# Patient Record
Sex: Male | Born: 1950 | Race: White | Hispanic: No | Smoking: Never smoker
Health system: Southern US, Community
[De-identification: ages and names within clinical notes are randomized; demographics above are authoritative.]

## PROBLEM LIST (undated history)

## (undated) DIAGNOSIS — K219 Gastro-esophageal reflux disease without esophagitis: Secondary | ICD-10-CM

## (undated) DIAGNOSIS — E119 Type 2 diabetes mellitus without complications: Secondary | ICD-10-CM

## (undated) DIAGNOSIS — T8859XA Other complications of anesthesia, initial encounter: Secondary | ICD-10-CM

## (undated) DIAGNOSIS — E785 Hyperlipidemia, unspecified: Secondary | ICD-10-CM

## (undated) DIAGNOSIS — H269 Unspecified cataract: Secondary | ICD-10-CM

## (undated) DIAGNOSIS — K2270 Barrett's esophagus without dysplasia: Secondary | ICD-10-CM

## (undated) DIAGNOSIS — M1990 Unspecified osteoarthritis, unspecified site: Secondary | ICD-10-CM

## (undated) DIAGNOSIS — Z87442 Personal history of urinary calculi: Secondary | ICD-10-CM

## (undated) DIAGNOSIS — T4145XA Adverse effect of unspecified anesthetic, initial encounter: Secondary | ICD-10-CM

## (undated) DIAGNOSIS — R011 Cardiac murmur, unspecified: Secondary | ICD-10-CM

## (undated) DIAGNOSIS — J189 Pneumonia, unspecified organism: Secondary | ICD-10-CM

## (undated) DIAGNOSIS — N200 Calculus of kidney: Secondary | ICD-10-CM

## (undated) DIAGNOSIS — T7840XA Allergy, unspecified, initial encounter: Secondary | ICD-10-CM

## (undated) DIAGNOSIS — I10 Essential (primary) hypertension: Secondary | ICD-10-CM

## (undated) HISTORY — DX: Type 2 diabetes mellitus without complications: E11.9

## (undated) HISTORY — PX: UPPER GASTROINTESTINAL ENDOSCOPY: SHX188

## (undated) HISTORY — DX: Gastro-esophageal reflux disease without esophagitis: K21.9

## (undated) HISTORY — DX: Unspecified cataract: H26.9

## (undated) HISTORY — PX: KIDNEY STONE SURGERY: SHX686

## (undated) HISTORY — PX: TONSILLECTOMY: SUR1361

## (undated) HISTORY — DX: Calculus of kidney: N20.0

## (undated) HISTORY — DX: Barrett's esophagus without dysplasia: K22.70

## (undated) HISTORY — PX: CATARACT EXTRACTION, BILATERAL: SHX1313

## (undated) HISTORY — DX: Allergy, unspecified, initial encounter: T78.40XA

## (undated) HISTORY — DX: Hyperlipidemia, unspecified: E78.5

## (undated) HISTORY — PX: COLONOSCOPY: SHX174

## (undated) HISTORY — PX: EYE SURGERY: SHX253

## (undated) HISTORY — DX: Essential (primary) hypertension: I10

---

## 1998-01-05 ENCOUNTER — Encounter: Payer: Self-pay | Admitting: Internal Medicine

## 1998-01-05 ENCOUNTER — Inpatient Hospital Stay (HOSPITAL_COMMUNITY): Admission: EM | Admit: 1998-01-05 | Discharge: 1998-01-06 | Payer: Self-pay | Admitting: Internal Medicine

## 1998-01-05 HISTORY — PX: TRANSTHORACIC ECHOCARDIOGRAM: SHX275

## 1998-07-12 ENCOUNTER — Emergency Department (HOSPITAL_COMMUNITY): Admission: EM | Admit: 1998-07-12 | Discharge: 1998-07-12 | Payer: Self-pay | Admitting: Emergency Medicine

## 1998-07-12 ENCOUNTER — Encounter: Payer: Self-pay | Admitting: Emergency Medicine

## 1998-07-13 ENCOUNTER — Encounter: Payer: Self-pay | Admitting: Emergency Medicine

## 2000-03-17 ENCOUNTER — Other Ambulatory Visit: Admission: RE | Admit: 2000-03-17 | Discharge: 2000-03-17 | Payer: Self-pay | Admitting: Gastroenterology

## 2004-02-14 ENCOUNTER — Ambulatory Visit: Payer: Self-pay | Admitting: Internal Medicine

## 2004-02-23 ENCOUNTER — Emergency Department (HOSPITAL_COMMUNITY): Admission: EM | Admit: 2004-02-23 | Discharge: 2004-02-23 | Payer: Self-pay | Admitting: Emergency Medicine

## 2004-02-24 ENCOUNTER — Ambulatory Visit (HOSPITAL_COMMUNITY): Admission: RE | Admit: 2004-02-24 | Discharge: 2004-02-24 | Payer: Self-pay | Admitting: Urology

## 2004-02-24 ENCOUNTER — Encounter: Payer: Self-pay | Admitting: Urology

## 2004-03-07 ENCOUNTER — Ambulatory Visit: Payer: Self-pay | Admitting: Internal Medicine

## 2004-03-13 ENCOUNTER — Ambulatory Visit: Payer: Self-pay | Admitting: Internal Medicine

## 2005-04-04 ENCOUNTER — Ambulatory Visit: Payer: Self-pay | Admitting: Internal Medicine

## 2005-04-11 ENCOUNTER — Ambulatory Visit: Payer: Self-pay | Admitting: Internal Medicine

## 2006-01-14 ENCOUNTER — Ambulatory Visit: Payer: Self-pay | Admitting: Internal Medicine

## 2006-02-18 ENCOUNTER — Ambulatory Visit: Payer: Self-pay | Admitting: Gastroenterology

## 2006-03-20 ENCOUNTER — Encounter (INDEPENDENT_AMBULATORY_CARE_PROVIDER_SITE_OTHER): Payer: Self-pay | Admitting: Specialist

## 2006-03-20 ENCOUNTER — Ambulatory Visit: Payer: Self-pay | Admitting: Gastroenterology

## 2006-03-20 HISTORY — PX: ESOPHAGOGASTRODUODENOSCOPY: SHX1529

## 2006-04-08 ENCOUNTER — Ambulatory Visit: Payer: Self-pay | Admitting: Internal Medicine

## 2006-04-08 LAB — CONVERTED CEMR LAB
ALT: 55 units/L — ABNORMAL HIGH (ref 0–40)
AST: 34 units/L (ref 0–37)
Albumin: 3.8 g/dL (ref 3.5–5.2)
Basophils Absolute: 0 10*3/uL (ref 0.0–0.1)
Calcium: 8.9 mg/dL (ref 8.4–10.5)
Chloride: 104 meq/L (ref 96–112)
Creatinine, Ser: 1.1 mg/dL (ref 0.4–1.5)
Eosinophils Absolute: 0.1 10*3/uL (ref 0.0–0.6)
Eosinophils Relative: 2.2 % (ref 0.0–5.0)
GFR calc non Af Amer: 74 mL/min
Glucose, Bld: 88 mg/dL (ref 70–99)
HCT: 44.2 % (ref 39.0–52.0)
Ketones, ur: NEGATIVE mg/dL
MCV: 87.4 fL (ref 78.0–100.0)
Neutrophils Relative %: 56.3 % (ref 43.0–77.0)
Nitrite: NEGATIVE
Platelets: 232 10*3/uL (ref 150–400)
RBC: 5.07 M/uL (ref 4.22–5.81)
RDW: 13.1 % (ref 11.5–14.6)
Specific Gravity, Urine: 1.02 (ref 1.000–1.03)
TSH: 1.49 microintl units/mL (ref 0.35–5.50)
Total Bilirubin: 1 mg/dL (ref 0.3–1.2)
Total CHOL/HDL Ratio: 6.7
Total Protein, Urine: NEGATIVE mg/dL
Triglycerides: 186 mg/dL — ABNORMAL HIGH (ref 0–149)
Urine Glucose: NEGATIVE mg/dL
Urobilinogen, UA: 0.2 (ref 0.0–1.0)
VLDL: 37 mg/dL (ref 0–40)
WBC: 4.3 10*3/uL — ABNORMAL LOW (ref 4.5–10.5)
pH: 5.5 (ref 5.0–8.0)

## 2006-04-13 ENCOUNTER — Ambulatory Visit: Payer: Self-pay | Admitting: Internal Medicine

## 2006-06-15 ENCOUNTER — Ambulatory Visit: Payer: Self-pay | Admitting: Internal Medicine

## 2007-01-19 ENCOUNTER — Ambulatory Visit: Payer: Self-pay | Admitting: Internal Medicine

## 2007-01-19 DIAGNOSIS — J45909 Unspecified asthma, uncomplicated: Secondary | ICD-10-CM | POA: Insufficient documentation

## 2007-01-19 DIAGNOSIS — K219 Gastro-esophageal reflux disease without esophagitis: Secondary | ICD-10-CM | POA: Insufficient documentation

## 2007-01-19 DIAGNOSIS — J309 Allergic rhinitis, unspecified: Secondary | ICD-10-CM | POA: Insufficient documentation

## 2007-01-19 DIAGNOSIS — I10 Essential (primary) hypertension: Secondary | ICD-10-CM

## 2007-01-19 DIAGNOSIS — E785 Hyperlipidemia, unspecified: Secondary | ICD-10-CM

## 2007-01-29 ENCOUNTER — Telehealth: Payer: Self-pay | Admitting: Internal Medicine

## 2007-01-29 ENCOUNTER — Ambulatory Visit: Payer: Self-pay | Admitting: Internal Medicine

## 2007-01-29 DIAGNOSIS — Z87442 Personal history of urinary calculi: Secondary | ICD-10-CM

## 2007-01-29 DIAGNOSIS — K2270 Barrett's esophagus without dysplasia: Secondary | ICD-10-CM

## 2007-03-04 ENCOUNTER — Encounter: Payer: Self-pay | Admitting: Internal Medicine

## 2007-10-20 ENCOUNTER — Ambulatory Visit: Payer: Self-pay | Admitting: Internal Medicine

## 2007-10-20 LAB — CONVERTED CEMR LAB
AST: 37 units/L (ref 0–37)
Basophils Absolute: 0 10*3/uL (ref 0.0–0.1)
Basophils Relative: 0.6 % (ref 0.0–3.0)
Bilirubin Urine: NEGATIVE
Bilirubin, Direct: 0.1 mg/dL (ref 0.0–0.3)
Chloride: 109 meq/L (ref 96–112)
Cholesterol: 230 mg/dL (ref 0–200)
Creatinine, Ser: 1.3 mg/dL (ref 0.4–1.5)
Direct LDL: 143.4 mg/dL
Eosinophils Absolute: 0.1 10*3/uL (ref 0.0–0.7)
GFR calc non Af Amer: 61 mL/min
HDL: 30.8 mg/dL — ABNORMAL LOW (ref >39.0)
Hemoglobin, Urine: NEGATIVE
Ketones, ur: NEGATIVE mg/dL
Leukocytes, UA: NEGATIVE
MCHC: 34.9 g/dL (ref 30.0–36.0)
MCV: 86.9 fL (ref 78.0–100.0)
Neutrophils Relative %: 62.2 % (ref 43.0–77.0)
Platelets: 211 10*3/uL (ref 150–400)
Potassium: 4.2 meq/L (ref 3.5–5.1)
RDW: 13 % (ref 11.5–14.6)
Sodium: 142 meq/L (ref 135–145)
Total Bilirubin: 1 mg/dL (ref 0.3–1.2)
Total CHOL/HDL Ratio: 7.5
Urine Glucose: NEGATIVE mg/dL
Urobilinogen, UA: 0.2 (ref 0.0–1.0)
VLDL: 57 mg/dL — ABNORMAL HIGH (ref 0–40)

## 2007-10-27 ENCOUNTER — Ambulatory Visit: Payer: Self-pay | Admitting: Internal Medicine

## 2008-03-03 ENCOUNTER — Encounter: Payer: Self-pay | Admitting: Internal Medicine

## 2008-07-11 ENCOUNTER — Ambulatory Visit: Payer: Self-pay | Admitting: Internal Medicine

## 2008-07-11 LAB — CONVERTED CEMR LAB
Cholesterol: 176 mg/dL (ref 0–200)
LDL Cholesterol: 117 mg/dL — ABNORMAL HIGH (ref 0–99)
Triglycerides: 105 mg/dL (ref 0.0–149.0)

## 2008-07-14 ENCOUNTER — Ambulatory Visit: Payer: Self-pay | Admitting: Internal Medicine

## 2008-07-14 DIAGNOSIS — K5289 Other specified noninfective gastroenteritis and colitis: Secondary | ICD-10-CM | POA: Insufficient documentation

## 2008-11-14 ENCOUNTER — Ambulatory Visit: Payer: Self-pay | Admitting: Internal Medicine

## 2008-11-15 LAB — CONVERTED CEMR LAB
ALT: 84 units/L — ABNORMAL HIGH (ref 0–53)
AST: 56 units/L — ABNORMAL HIGH (ref 0–37)
BUN: 16 mg/dL (ref 6–23)
Bilirubin, Direct: 0.1 mg/dL (ref 0.0–0.3)
Cholesterol: 186 mg/dL (ref 0–200)
Creatinine, Ser: 1.1 mg/dL (ref 0.4–1.5)
Eosinophils Relative: 1.9 % (ref 0.0–5.0)
GFR calc non Af Amer: 73.64 mL/min (ref >60)
Ketones, ur: NEGATIVE mg/dL
LDL Cholesterol: 113 mg/dL — ABNORMAL HIGH (ref 0–99)
Leukocytes, UA: NEGATIVE
Monocytes Relative: 10.3 % (ref 3.0–12.0)
Neutrophils Relative %: 62.7 % (ref 43.0–77.0)
PSA: 0.95 ng/mL (ref 0.10–4.00)
Platelets: 209 10*3/uL (ref 150.0–400.0)
Specific Gravity, Urine: 1.025 (ref 1.000–1.030)
Total Bilirubin: 1 mg/dL (ref 0.3–1.2)
Triglycerides: 181 mg/dL — ABNORMAL HIGH (ref 0.0–149.0)
Urine Glucose: NEGATIVE mg/dL
VLDL: 36.2 mg/dL (ref 0.0–40.0)
WBC: 5.9 10*3/uL (ref 4.5–10.5)
pH: 5.5 (ref 5.0–8.0)

## 2008-11-22 ENCOUNTER — Ambulatory Visit: Payer: Self-pay | Admitting: Internal Medicine

## 2009-01-02 ENCOUNTER — Ambulatory Visit: Payer: Self-pay | Admitting: Gastroenterology

## 2009-01-02 ENCOUNTER — Encounter (INDEPENDENT_AMBULATORY_CARE_PROVIDER_SITE_OTHER): Payer: Self-pay | Admitting: *Deleted

## 2009-01-15 ENCOUNTER — Ambulatory Visit: Payer: Self-pay | Admitting: Gastroenterology

## 2009-01-15 LAB — HM COLONOSCOPY

## 2009-02-07 ENCOUNTER — Encounter (INDEPENDENT_AMBULATORY_CARE_PROVIDER_SITE_OTHER): Payer: Self-pay | Admitting: *Deleted

## 2009-03-20 ENCOUNTER — Ambulatory Visit: Payer: Self-pay | Admitting: Internal Medicine

## 2009-03-20 DIAGNOSIS — J209 Acute bronchitis, unspecified: Secondary | ICD-10-CM | POA: Insufficient documentation

## 2009-05-16 ENCOUNTER — Ambulatory Visit: Payer: Self-pay | Admitting: Internal Medicine

## 2009-05-16 LAB — CONVERTED CEMR LAB
Albumin: 4.1 g/dL (ref 3.5–5.2)
Alkaline Phosphatase: 82 units/L (ref 39–117)
BUN: 19 mg/dL (ref 6–23)
Chloride: 107 meq/L (ref 96–112)
Cholesterol: 230 mg/dL — ABNORMAL HIGH (ref 0–200)
Direct LDL: 174.2 mg/dL
HDL: 38.2 mg/dL — ABNORMAL LOW (ref >39.00)
Hgb A1c MFr Bld: 5.9 % (ref 4.6–6.5)
Potassium: 4.4 meq/L (ref 3.5–5.1)
Sodium: 141 meq/L (ref 135–145)
Testosterone: 425.97 ng/dL (ref 350.00–890.00)
Total CHOL/HDL Ratio: 6
VLDL: 36.6 mg/dL (ref 0.0–40.0)

## 2009-05-23 ENCOUNTER — Ambulatory Visit: Payer: Self-pay | Admitting: Internal Medicine

## 2009-08-06 ENCOUNTER — Telehealth: Payer: Self-pay | Admitting: Internal Medicine

## 2009-10-10 ENCOUNTER — Telehealth: Payer: Self-pay | Admitting: Gastroenterology

## 2009-12-28 ENCOUNTER — Ambulatory Visit: Payer: Self-pay | Admitting: Internal Medicine

## 2009-12-29 LAB — CONVERTED CEMR LAB
AST: 42 units/L — ABNORMAL HIGH (ref 0–37)
Albumin: 4.1 g/dL (ref 3.5–5.2)
Alkaline Phosphatase: 86 units/L (ref 39–117)
Basophils Relative: 0.5 % (ref 0.0–3.0)
Bilirubin Urine: NEGATIVE
Bilirubin, Direct: 0.1 mg/dL (ref 0.0–0.3)
CO2: 28 meq/L (ref 19–32)
Calcium: 9.1 mg/dL (ref 8.4–10.5)
Creatinine, Ser: 1.1 mg/dL (ref 0.4–1.5)
GFR calc non Af Amer: 74.12 mL/min (ref >60)
HDL: 43.3 mg/dL (ref >39.00)
Hemoglobin, Urine: NEGATIVE
Hemoglobin: 15.1 g/dL (ref 13.0–17.0)
Leukocytes, UA: NEGATIVE
Lymphocytes Relative: 23.3 % (ref 12.0–46.0)
Monocytes Relative: 9.4 % (ref 3.0–12.0)
Neutro Abs: 3.2 10*3/uL (ref 1.4–7.7)
Neutrophils Relative %: 64.3 % (ref 43.0–77.0)
Nitrite: NEGATIVE
PSA: 0.71 ng/mL (ref 0.10–4.00)
RBC: 5.02 M/uL (ref 4.22–5.81)
Sodium: 139 meq/L (ref 135–145)
Total CHOL/HDL Ratio: 5
Total Protein, Urine: NEGATIVE mg/dL
Total Protein: 7.4 g/dL (ref 6.0–8.3)
Triglycerides: 138 mg/dL (ref 0.0–149.0)
WBC: 4.9 10*3/uL (ref 4.5–10.5)
pH: 5.5 (ref 5.0–8.0)

## 2010-01-04 ENCOUNTER — Encounter: Payer: Self-pay | Admitting: Internal Medicine

## 2010-01-04 ENCOUNTER — Ambulatory Visit: Payer: Self-pay | Admitting: Internal Medicine

## 2010-01-04 DIAGNOSIS — Z862 Personal history of diseases of the blood and blood-forming organs and certain disorders involving the immune mechanism: Secondary | ICD-10-CM

## 2010-01-04 DIAGNOSIS — Z8639 Personal history of other endocrine, nutritional and metabolic disease: Secondary | ICD-10-CM

## 2010-03-26 NOTE — Assessment & Plan Note (Signed)
Summary: COUGH--FEVER---STC   Vital Signs:  Patient profile:   60 year old male Weight:      239 pounds Temp:     98.5 degrees F oral Pulse rate:   83 / minute BP sitting:   122 / 84  (left arm)  Vitals Entered By: Tora Perches (March 20, 2009 4:13 PM) CC: cough-  fever Is Patient Diabetic? No   CC:  cough-  fever.  History of Present Illness: C/o cough x 2 d - bad   Preventive Screening-Counseling & Management  Alcohol-Tobacco     Smoking Status: current  Current Medications (verified): 1)  Azor 5-40 Mg  Tabs (Amlodipine-Olmesartan) .Marland Kitchen.. 1 Qd 2)  Protonix 40 Mg  Tbec (Pantoprazole Sodium) .... Once Daily 3)  Zyrtec Allergy 10 Mg  Tabs (Cetirizine Hcl) .Marland Kitchen.. 1 Once Daily Prn 4)  Vitamin D3 1000 Unit  Tabs (Cholecalciferol) .Marland Kitchen.. 1 Qd 5)  Pramosone 1-2.5 % Oint (Pramoxine-Hc) .... Use Bid 6)  Hydrocodone-Acetaminophen 5-325 Mg Tabs (Hydrocodone-Acetaminophen) .Marland Kitchen.. 1 By Mouth Up To 4 Times Per Day As Needed For Pain  Allergies: 1)  ! Penicillin 2)  ! Cipro 3)  ! * Nexium 4)  ! Biaxin (Clarithromycin) 5)  ! Pravachol 6)  ! * Albuterol 7)  ! * Flovent 8)  ! Vasotec 9)  Crestor (Rosuvastatin Calcium)  Past History:  Past Medical History: Last updated: 01/29/2007 Allergic rhinitis Asthma GERD/ Barrett's Hyperlipidemia Hypertension Nephrolithiasis, hx of Barrett's esophagus  Social History: Last updated: 11/22/2008 Single Never Smoked Regular exercise-yes  Social History: Smoking Status:  current  Review of Systems       The patient complains of chest pain and prolonged cough.  The patient denies fever, dyspnea on exertion, and abdominal pain.    Physical Exam  General:  Well-developed,well-nourished,in no acute distress; alert,appropriate and cooperative throughout examination Mouth:  Erythematous throat mucosa and intranasal erythema.  Lungs:  Normal respiratory effort, chest expands symmetrically. Lungs are clear to auscultation, no crackles or  wheezes. Heart:  Normal rate and regular rhythm. S1 and S2 normal without gallop, murmur, click, rub or other extra sounds. Abdomen:  Bowel sounds positive, abdomen soft and some tender without masses, organomegaly or hernias noted.   Impression & Recommendations:  Problem # 1:  BRONCHITIS, ACUTE (ICD-466.0) Assessment New  His updated medication list for this problem includes:    Levaquin 500 Mg Tabs (Levofloxacin) .Marland Kitchen... 1 by mouth qd    Tessalon Perles 100 Mg Caps (Benzonatate) .Marland Kitchen... 1-2 by mouth two times a day as needed cogh    Promethazine-codeine 6.25-10 Mg/67ml Syrp (Promethazine-codeine) .Marland Kitchen... 5-10 ml by mouth q id as needed cough  Complete Medication List: 1)  Azor 5-40 Mg Tabs (Amlodipine-olmesartan) .Marland Kitchen.. 1 qd 2)  Protonix 40 Mg Tbec (Pantoprazole sodium) .... Once daily 3)  Zyrtec Allergy 10 Mg Tabs (Cetirizine hcl) .Marland Kitchen.. 1 once daily prn 4)  Vitamin D3 1000 Unit Tabs (Cholecalciferol) .Marland Kitchen.. 1 qd 5)  Pramosone 1-2.5 % Oint (Pramoxine-hc) .... Use bid 6)  Hydrocodone-acetaminophen 5-325 Mg Tabs (Hydrocodone-acetaminophen) .Marland Kitchen.. 1 by mouth up to 4 times per day as needed for pain 7)  Levaquin 500 Mg Tabs (Levofloxacin) .Marland Kitchen.. 1 by mouth qd 8)  Tessalon Perles 100 Mg Caps (Benzonatate) .Marland Kitchen.. 1-2 by mouth two times a day as needed cogh 9)  Promethazine-codeine 6.25-10 Mg/72ml Syrp (Promethazine-codeine) .... 5-10 ml by mouth q id as needed cough  Patient Instructions: 1)  add testost to next labs pleasre 780.79 2)  Call if  you are not better in a reasonable amount of time or if worse.  Prescriptions: PROMETHAZINE-CODEINE 6.25-10 MG/5ML SYRP (PROMETHAZINE-CODEINE) 5-10 ml by mouth q id as needed cough  #300 ml x 0   Entered and Authorized by:   Tresa Garter MD   Signed by:   Tresa Garter MD on 03/20/2009   Method used:   Print then Give to Patient   RxID:   [id number] TESSALON PERLES 100 MG CAPS (BENZONATATE) 1-2 by mouth two times a day as needed cogh  #120 x 1    Entered and Authorized by:   Tresa Garter MD   Signed by:   Tresa Garter MD on 03/20/2009   Method used:   Print then Give to Patient   RxID:   [id number] LEVAQUIN 500 MG TABS (LEVOFLOXACIN) 1 by mouth qd  #10 x 0   Entered and Authorized by:   Tresa Garter MD   Signed by:   Tresa Garter MD on 03/20/2009   Method used:   Print then Give to Patient   RxID:   [id number]

## 2010-03-26 NOTE — Assessment & Plan Note (Signed)
Summary: FU  #  STC   Vital Signs:  Patient profile:   60 year old male Weight:      239 pounds BMI:     31.65 Temp:     98.5 degrees F oral Pulse rate:   103 / minute BP sitting:   134 / 80  (left arm)  Vitals Entered By: Tora Perches (May 23, 2009 2:33 PM) CC: F/U Is Patient Diabetic? No   CC:  F/U.  History of Present Illness: The patient presents for a follow up of hypertension, asthma, hyperlipidemia   Preventive Screening-Counseling & Management  Alcohol-Tobacco     Smoking Status: current  Current Medications (verified): 1)  Azor 5-40 Mg  Tabs (Amlodipine-Olmesartan) .Marland Kitchen.. 1 Qd 2)  Protonix 40 Mg  Tbec (Pantoprazole Sodium) .... Once Daily 3)  Zyrtec Allergy 10 Mg  Tabs (Cetirizine Hcl) .Marland Kitchen.. 1 Once Daily Prn 4)  Vitamin D3 1000 Unit  Tabs (Cholecalciferol) .Marland Kitchen.. 1 Qd 5)  Pramosone 1-2.5 % Oint (Pramoxine-Hc) .... Use Bid 6)  Hydrocodone-Acetaminophen 5-325 Mg Tabs (Hydrocodone-Acetaminophen) .Marland Kitchen.. 1 By Mouth Up To 4 Times Per Day As Needed For Pain 7)  Levaquin 500 Mg Tabs (Levofloxacin) .Marland Kitchen.. 1 By Mouth Qd 8)  Tessalon Perles 100 Mg Caps (Benzonatate) .Marland Kitchen.. 1-2 By Mouth Two Times A Day As Needed Cogh 9)  Promethazine-Codeine 6.25-10 Mg/42ml Syrp (Promethazine-Codeine) .... 5-10 Ml By Mouth Q Id As Needed Cough  Allergies: 1)  ! Penicillin 2)  ! Cipro 3)  ! * Nexium 4)  ! Biaxin (Clarithromycin) 5)  ! Pravachol 6)  ! * Albuterol 7)  ! * Flovent 8)  ! Vasotec 9)  Crestor (Rosuvastatin Calcium) 10)  Niacin  Physical Exam  General:  Well-developed,well-nourished,in no acute distress; alert,appropriate and cooperative throughout examination Nose:  External nasal examination shows no deformity or inflammation. Nasal mucosa are pink and moist without lesions or exudates. Mouth:  Erythematous throat mucosa and intranasal erythema.  Neck:  No deformities, masses, or tenderness noted. Lungs:  Normal respiratory effort, chest expands symmetrically. Lungs are  clear to auscultation, no crackles or wheezes. Heart:  Normal rate and regular rhythm. S1 and S2 normal without gallop, murmur, click, rub or other extra sounds. Abdomen:  Bowel sounds positive, abdomen soft and some tender without masses, organomegaly or hernias noted. Msk:  No deformity or scoliosis noted of thoracic or lumbar spine.   Extremities:  no edema Neurologic:  No cranial nerve deficits noted. Station and gait are normal. Plantar reflexes are down-going bilaterally. DTRs are symmetrical throughout. Sensory, motor and coordinative functions appear intact. Skin:  Intact without suspicious lesions or rashes Psych:  Cognition and judgment appear intact. Alert and cooperative with normal attention span and concentration. No apparent delusions, illusions, hallucinations   Impression & Recommendations:  Problem # 1:  HYPERTENSION (ICD-401.9)  His updated medication list for this problem includes:    Azor 5-40 Mg Tabs (Amlodipine-olmesartan) .Marland Kitchen... 1 qd  Problem # 2:  BARRETTS ESOPHAGUS (ICD-530.85)  Problem # 3:  ASTHMA (ICD-493.90) Assessment: Improved  Problem # 4:  ALLERGIC RHINITIS (ICD-477.9) Assessment: Comment Only  His updated medication list for this problem includes:    Zyrtec Allergy 10 Mg Tabs (Cetirizine hcl) .Marland Kitchen... 1 once daily prn  Problem # 5:  HYPERLIPIDEMIA (ICD-272.4) Assessment: Deteriorated  His updated medication list for this problem includes:    Welchol 3.75 Gm Pack (Colesevelam hcl) .Marland Kitchen... 1 by mouth qd  Labs Reviewed: SGOT: 40 (05/16/2009)   SGPT: 71 (05/16/2009)  HDL:38.20 (05/16/2009), 36.60 (11/14/2008)  LDL:113 (11/14/2008), 117 (07/11/2008)  Chol:230 (05/16/2009), 186 (11/14/2008)  Trig:183.0 (05/16/2009), 181.0 (11/14/2008)  Complete Medication List: 1)  Azor 5-40 Mg Tabs (Amlodipine-olmesartan) .Marland Kitchen.. 1 qd 2)  Protonix 40 Mg Tbec (Pantoprazole sodium) .... Once daily 3)  Zyrtec Allergy 10 Mg Tabs (Cetirizine hcl) .Marland Kitchen.. 1 once daily prn 4)   Vitamin D3 1000 Unit Tabs (Cholecalciferol) .Marland Kitchen.. 1 qd 5)  Pramosone 1-2.5 % Oint (Pramoxine-hc) .... Use bid 6)  Hydrocodone-acetaminophen 5-325 Mg Tabs (Hydrocodone-acetaminophen) .Marland Kitchen.. 1 by mouth up to 4 times per day as needed for pain 7)  Levaquin 500 Mg Tabs (Levofloxacin) .Marland Kitchen.. 1 by mouth qd 8)  Tessalon Perles 100 Mg Caps (Benzonatate) .Marland Kitchen.. 1-2 by mouth two times a day as needed cogh 9)  Promethazine-codeine 6.25-10 Mg/33ml Syrp (Promethazine-codeine) .... 5-10 ml by mouth q id as needed cough 10)  Welchol 3.75 Gm Pack (Colesevelam hcl) .Marland Kitchen.. 1 by mouth qd  Patient Instructions: 1)  Read about Welchol 2)  Please schedule a follow-up appointment in 6 months well w/labs.  Prescriptions: WELCHOL 3.75 GM PACK (COLESEVELAM HCL) 1 by mouth qd  #30 x 12   Entered and Authorized by:   Tresa Garter MD   Signed by:   Tresa Garter MD on 05/23/2009   Method used:   Print then Give to Patient   RxID:   [id number] PRAMOSONE 1-2.5 % OINT (PRAMOXINE-HC) use bid  #45 g x 4   Entered and Authorized by:   Tresa Garter MD   Signed by:   Tresa Garter MD on 05/23/2009   Method used:   Print then Give to Patient   RxID:   [id number]

## 2010-03-26 NOTE — Progress Notes (Signed)
Summary: Schedule Endoscopy   Phone Note Outgoing Call Call back at [facility] Phone [phone]   Call placed by: Harlow Mares CMA Duncan Dull),  October 10, 2009 9:39 AM Call placed to: Patient Summary of Call: Left message on patients machine to call back, patient is due for a EGD Initial call taken by: Harlow Mares CMA Duncan Dull),  October 10, 2009 9:40 AM  Follow-up for Phone Call        Left a message on the patient machine to call back and schedule a previsit and procedure with our office. A letter will be mailed to the patient.   Follow-up by: Harlow Mares CMA (AAMA),  October 15, 2009 10:15 AM

## 2010-03-26 NOTE — Assessment & Plan Note (Signed)
Summary: CPX/NWS  #   Vital Signs:  Patient profile:   60 year old male Height:      73 inches Weight:      241 pounds BMI:     31.91 Temp:     98.5 degrees F oral Pulse rate:   88 / minute Pulse rhythm:   regular Resp:     16 per minute BP sitting:   130 / 78  (left arm) Cuff size:   large  Vitals Entered By: Lanier Prude, Beverly Gust) (January 04, 2010 8:32 AM) CC: CPX Is Patient Diabetic? No   CC:  CPX.  History of Present Illness: The patient presents for a preventive health examination   Preventive Screening-Counseling & Management  Caffeine-Diet-Exercise     Does Patient Exercise: yes  Current Medications (verified): 1)  Azor 5-40 Mg  Tabs (Amlodipine-Olmesartan) .Marland Kitchen.. 1 Qd 2)  Protonix 40 Mg  Tbec (Pantoprazole Sodium) .... Once Daily 3)  Zyrtec Allergy 10 Mg  Tabs (Cetirizine Hcl) .Marland Kitchen.. 1 Once Daily Prn 4)  Vitamin D3 1000 Unit  Tabs (Cholecalciferol) .Marland Kitchen.. 1 Qd 5)  Pramosone 1-2.5 % Oint (Pramoxine-Hc) .... Use Bid 6)  Hydrocodone-Acetaminophen 5-325 Mg Tabs (Hydrocodone-Acetaminophen) .Marland Kitchen.. 1 By Mouth Up To 4 Times Per Day As Needed For Pain 7)  Welchol 3.75 Gm Pack (Colesevelam Hcl) .Marland Kitchen.. 1 By Mouth Qd  Allergies (verified): 1)  ! Penicillin 2)  ! Cipro 3)  ! * Nexium 4)  ! Biaxin (Clarithromycin) 5)  ! Pravachol 6)  ! * Albuterol 7)  ! * Flovent 8)  ! Vasotec 9)  Crestor (Rosuvastatin Calcium) 10)  Niacin 11)  Welchol  Past History:  Past Medical History: Last updated: 01/29/2007 Allergic rhinitis Asthma GERD/ Barrett's Hyperlipidemia Hypertension Nephrolithiasis, hx of Barrett's esophagus  Past Surgical History: Last updated: 07/29/2007 Tonsillectomy EGD ( 03/20/2006) Transthoracic Echocardiogram (01/05/1998)  Family History: Last updated: 01/29/2007 Family History Hypertension mother with colon polyps  Social History: Last updated: 01/04/2010 Single Never Smoked Regular exercise-yes Occupation: Tree surgeon for [employer] Alcohol use-no  Social History: Single Never Smoked Regular exercise-yes 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    Physical Exam  General:  Well-developed,well-nourished,in no acute distress; alert,appropriate and cooperative throughout examination Head:  Normocephalic and atraumatic without obvious abnormalities. No apparent alopecia or balding. Eyes:  No corneal or conjunctival inflammation noted. EOMI. Perrla. Funduscopic exam benign, without hemorrhages, exudates or papilledema. Vision grossly normal. Ears:  External ear exam shows no significant lesions or deformities.  Otoscopic examination reveals clear canals, tympanic membranes are intact bilaterally without bulging, retraction, inflammation or discharge. Hearing is grossly normal bilaterally. Nose:  External nasal examination shows no deformity or inflammation. Nasal mucosa are pink and moist without lesions or exudates. Mouth:  Erythematous throat mucosa and intranasal erythema.  Neck:  No deformities, masses, or tenderness noted. Lungs:  Normal respiratory effort, chest expands symmetrically. Lungs are clear to auscultation, no crackles or wheezes. Heart:  Normal rate and regular rhythm. S1 and S2 normal without gallop, murmur, click, rub or other extra sounds. Abdomen:  Bowel sounds positive, abdomen soft and some  tender without masses, organomegaly or hernias noted. Rectal:   No external abnormalities noted. Normal sphincter tone. No rectal masses or tenderness. G(-) Genitalia:  Testes bilaterally descended without nodularity, tenderness or masses. No scrotal masses or lesions. No penis lesions or urethral discharge. Prostate:  Prostate gland firm and smooth, no enlargement, nodularity, tenderness, mass, asymmetry or induration. Msk:  No deformity or scoliosis noted of thoracic or lumbar spine.   Pulses:  R and L carotid,radial,femoral,dorsalis pedis and posterior tibial pulses are full and equal bilaterally Extremities:  No clubbing, cyanosis, edema, or deformity noted with normal full range of motion of all joints.   Neurologic:  No cranial nerve deficits noted. Station and gait are normal. Plantar reflexes are down-going bilaterally. DTRs are symmetrical throughout. Sensory, motor and coordinative functions appear intact. Skin:  Intact without suspicious lesions or rashes Cervical Nodes:  No lymphadenopathy noted Inguinal Nodes:  No significant adenopathy Psych:  Cognition and judgment appear intact. Alert and cooperative with normal attention span and concentration. No apparent delusions, illusions, hallucinations   Impression & Recommendations:  Problem # 1:  WELL ADULT EXAM (ICD-V70.0) Assessment New Health and age related issues were discussed. Available screening tests and vaccinations were discussed as well. Healthy life style including good diet and exercise was discussed.  The labs were reviewed with the patient.  Orders: EKG w/ Interpretation (93000) no change from before  Problem # 2:  BARRETTS ESOPHAGUS (ICD-530.85) Assessment: Unchanged On the regimen of medicine(s) reflected in the chart    Problem # 3:  HYPERTENSION (ICD-401.9) Assessment: Improved  His updated medication list for this problem includes:    Azor 5-40 Mg Tabs (Amlodipine-olmesartan) .Marland Kitchen... 1 qd  Problem # 4:  ASTHMA (ICD-493.90) Assessment: Improved  Problem # 5:  LIVER  FUNCTION TESTS, ABNORMAL, HX OF (ICD-V12.2) Assessment: Unchanged  Complete Medication List: 1)  Azor 5-40 Mg Tabs (Amlodipine-olmesartan) .Marland Kitchen.. 1 qd 2)  Protonix 40 Mg Tbec (Pantoprazole sodium) .... Once daily 3)  Zyrtec Allergy 10 Mg Tabs (Cetirizine hcl) .Marland Kitchen.. 1 once daily prn 4)  Vitamin D3 1000 Unit Tabs (Cholecalciferol) .Marland Kitchen.. 1 qd 5)  Pramosone 1-2.5 % Oint (Pramoxine-hc) .... Use bid  Patient Instructions: 1)  Please schedule a follow-up appointment in 6 months. 2)  BMP prior to visit, ICD-9: 3)  Hepatic Panel prior to visit, ICD-9:272.0 4)  Lipid Panel prior to visit, ICD-9: Prescriptions: PRAMOSONE 1-2.5 % OINT (PRAMOXINE-HC) use bid  #45 g x 4   Entered and Authorized by:   Tresa Garter MD   Signed by:   Tresa Garter MD on 01/04/2010   Method used:   Print then Give to Patient   RxID:   [id number]    Orders Added: 1)  EKG w/ Interpretation [93000] 2)  Est. Patient age 84-64 [99396]   Immunization History:  Influenza Immunization History:    Influenza:  historical (11/19/2009)  Tetanus Vaccine (to be given today)   Immunization History:  Influenza Immunization History:    Influenza:  Historical (11/19/2009)  Tetanus Vaccine (to be given today)   Appended Document: CPX/NWS  #     Clinical Lists Changes  Orders: Added new Service order of Tdap => 64yrs IM (14782) - Signed Added new Service order of Admin 1st Vaccine (95621) - Signed Observations: Added new observation of TD BOOST VIS: 01/12/08 version given January 04, 2010. (01/04/2010 9:13) Added new observation of TD BOOSTERLO: [id number] (01/04/2010 9:13) Added new observation of TD BOOST EXP: 12/14/2011 (01/04/2010 9:13) Added new  observation of TD BOOSTERBY: Lanier Prude, [facility]) (01/04/2010 9:13) Added new observation of TD BOOSTERRT: IM (01/04/2010 9:13) Added new observation of TDBOOSTERDSE: 0.5 ml (01/04/2010 9:13) Added new observation of TD BOOSTERMF:  GlaxoSmithK[employer] (01/04/2010 9:13) Added new observation of TD BOOST SIT: left deltoid (01/04/2010 9:13) Added new observation of TD BOOSTER: Tdap (01/04/2010 9:13)       Immunizations Administered:  Tetanus Vaccine:    Vaccine Type: Tdap    Site: left deltoid    Mfr: GlaxoSmithKline    Dose: 0.5 ml    Route: IM    Given by: Lanier Prude, CMA(AAMA)    Exp. Date: 12/14/2011    Lot #: [id number]    VIS given: 01/12/08 version given January 04, 2010.

## 2010-03-26 NOTE — Progress Notes (Signed)
Summary: [facility]  Phone Note Call from Patient Call back at [phone]   Summary of Call: Patient left message on triage that his meds now have to go to [facility] due to insurance. Patient needs Azor and Protonix. Patient aware we sent them on his behalf. Initial call taken by: Lucious Groves,  August 06, 2009 1:47 PM    Prescriptions: PROTONIX 40 MG  TBEC (PANTOPRAZOLE SODIUM) once daily  #90 x 3   Entered by:   Lucious Groves   Authorized by:   Tresa Garter MD   Signed by:   Lucious Groves on 08/06/2009   Method used:   Faxed to ...       [facility]             , [place]         Ph:        Fax: [phone]   RxID:   [id number] AZOR 5-40 MG  TABS (AMLODIPINE-OLMESARTAN) 1 qd  #90 x 3   Entered by:   Lucious Groves   Authorized by:   Tresa Garter MD   Signed by:   Lucious Groves on 08/06/2009   Method used:   Faxed to ...       [facility] (mail-order)             , [place]         Ph:        Fax: [phone]   RxID:   [id number]

## 2010-06-28 ENCOUNTER — Other Ambulatory Visit: Payer: Self-pay

## 2010-06-28 ENCOUNTER — Other Ambulatory Visit: Payer: Self-pay | Admitting: Internal Medicine

## 2010-06-28 DIAGNOSIS — E7800 Pure hypercholesterolemia, unspecified: Secondary | ICD-10-CM

## 2010-07-05 ENCOUNTER — Ambulatory Visit: Payer: Self-pay | Admitting: Internal Medicine

## 2010-07-12 ENCOUNTER — Other Ambulatory Visit: Payer: Self-pay

## 2010-07-12 NOTE — Assessment & Plan Note (Signed)
[facility]                           PRIMARY CARE OFFICE NOTE   BRACY, PEPPER                     MRN:          [id number]  DATE:04/13/2006                            DOB:          1950-07-08    The patient is a 60 year old male who presents for wellness examination.  Past medical history, family history, social history, see April 11, 2006 note.  Had endoscopy in 2007.   ALLERGIES:  Reviewed with the patient.   MEDICINES:  Reviewed with the patient.   REVIEW OF SYSTEMS:  Note chest pain or shortness of breath, no syncope,  no indigestion.  He has been sick with a cold lately, coughing quite  bad.  Does have some food allergies that may turn quite bad.  The rest  of the 18-point review of systems is negative.   PHYSICAL EXAMINATION:  Blood pressure 129/77, pulse 111, temp 98.6,  weight 239 pounds (was 249).  Looks well, no acute distress.  HEENT:  Moist mucosa, erythema of the throat.  NECK:  Supple, no thyromegaly or bruit.  LUNGS:  Clear, no wheezes or rales.  HEART:  S1, S2, no murmur, no gallop.  ABDOMEN:  Soft, nontender, no organomegaly, no masses.  EXTREMITIES:  Lower extremity without edema.  NEURO: He is alert and appropriate and denies any distress.  SKIN:  Clear.   LABORATORY DATA:  On April 08, 2006, CBC normal, CMET normal,  cholesterol 232, LDL 173, TSH 1.49, PSA 0.82, h. Pylori negative,  urinalysis normal.   EKG today with sinus tachycardia, heart rate 100.   ASSESSMENT/PLAN:  1. Normal wellness examination.  Age/health related issues discussed.      Healthy lifestyle discussed.  He has slight tachycardia, likely due      to cold-like illness.  Advised to schedule colonoscopy, repeat exam      in 12 months.  2. Hypertension, well controlled now on Azor without side effects,      will continue.  3. Barrett's esophagitis, will continue with Protonix 40 mg daily, a      regular esoph. cancer surveillance.  4. Asthma, well controlled, cont. Rx.  5. Food allergies with anaphylactic-like reaction at times.  EpiPen.      He will make Ziploc bags with Sudafed to take 60 or 90 mg at once,      Benadryl 50 mg at once, prednisone 60 mg  day one, 20 mg  day two,      10 mg  day three.  EpiPen p.r.n.  6. Upper respiratory infection, given prescription for Phenergan for      codeine syrup 5 cc q.4 h.      p.r.n.  7. Call if problems, I will see him back in 6 months.     Georgina Quint. Plotnikov, MD  Electronically Signed    AVP/MedQ  DD: 04/16/2006  DT: 04/16/2006  Job #: [id number]

## 2010-07-12 NOTE — Op Note (Signed)
NAME:  Corey [name], Corey [name]              ACCOUNT NO.:  [id number]   MEDICAL RECORD NO.:  [id number]          PATIENT TYPE:  AMB   LOCATION:  DAY                          FACILITY:  [facility]   PHYSICIAN:  Mark C. Vernie Ammons, M.D.  DATE OF BIRTH:  [birth year]   DATE OF PROCEDURE:  02/24/2004  DATE OF DISCHARGE:                                 OPERATIVE REPORT   PREOPERATIVE DIAGNOSIS:  Left distal ureteral calculus.   POSTOPERATIVE DIAGNOSIS:  Left distal ureteral calculus.   PROCEDURES:  1.  Cystoscopy.  2.  Left retrograde pyelogram with interpretation.  3.  Left ureteroscopy with stone extraction and double-J stent placement.   SURGEON:  Mark C. Vernie Ammons, M.D.   ANESTHESIA:  General.   BLOOD LOSS:  Less than 1 mL.   DRAINS:  A 6 French 26 cm double-J stent with string in the left ureter.   SPECIMENS:  Stone to patient.   COMPLICATIONS:  None.   INDICATIONS:  The patient is a 60 year old white male who has a history of  kidney stones.  Two days ago he began to have some left lower quadrant and  flank pain.  He was seen by Dr. Vonita Moss in the office and at that time was  not having severe pain.  He was given pain medications with hope of passing  the stone.  An attempt to visualize the stone was unsuccessful in the  office.  A day later, he developed severe pain in the left flank with  radiation into the groin.  He went to the emergency room.  A CT scan was  obtained which revealed a 7 mm stone at the left UVJ region.  There was  proximal hydronephrosis.  His pain was controlled, but he said it has since  been uncontrollable with oral Dilaudid.  He presented to the emergency room  again with nausea and vomiting, as well as gross hematuria.  We felt at this  point that he has had an adequate trial in passing the stone.  Due to the  significant pain he continues to have, I discussed ureteroscopic extraction  with him.  He has undergone this before and has elected to proceed with that  at  this time, understanding the risks and complications.   DESCRIPTION OF PROCEDURE:  After informed consent, the patient was brought  to major OR, placed on the table, administered general anesthesia and then  moved into the dorsolithotomy position.  His genitalia were sterilely  prepped and draped.  A 22 French cystoscope with 12 degree lens was  inserted.  The urethra was noted to be entirely normal down to the  sphincter, which appeared intact.  The prosthetic urethra was without  lesions and was nonobstructing.  The bladder was entered and drained.  It  was then inspected and noted to be free of any tumors, stones or  inflammatory lesions.  The left orifice was identified.  It was noted to be  relatively small.  I therefore passed a 0.038 inch floppy tipped guide wire  up the left ureter and over this passed the ureteral dilating balloon.  The  ureter was dilated and the dilating balloon and guide wire were removed, as  well as the cystoscope.   The 6 French rigid ureteroscope was then introduced under direct  visualization into the bladder.  The left orifice was identified and it was  passed easily up the left ureter to where a stone was identified and  photographed.  A __________ basket was then used to grasp the stone and it  was extracted without difficulty.   The cystoscope was reinserted and the 0.038 inch floppy tipped guide wire  was then passed up the left ureter into the renal pelvis under direct  fluoroscopic control.  An open ended catheter was then passed over the guide  wire into the distal ureter and the guide wire removed.  A retrograde  pyelogram was performed, revealing no proximal filling defects or  abnormalities.  No extravasation was noted.  The guide wire was then passed  through the open ended catheter back into the renal pelvis.  The catheter  was removed and the double-J stent was then placed with the guide wire being  removed and good curl being noted in the  area of the renal pelvis and the  bladder.  The bladder was drained and the patient was awaken and taken to  the recovery room in stable and satisfactory condition.  He tolerated the  procedure well with no intraoperative complications.   He was given a B&O suppository, as well as intraoperative Toradol and 2%  lidocaine urethral anesthetic was instilled.  The string was taped to the  dorsum of the penis.  The plan will be to discharge the patient from the  recovery room.  He will follow up with me in approximately four days for  stent removal.     Mark   MCO/MEDQ  D:  02/24/2004  T:  02/24/2004  Job:  [id number]

## 2010-07-12 NOTE — Assessment & Plan Note (Signed)
Corey [name]                         GASTROENTEROLOGY OFFICE NOTE   LAKE, CINQUEMANI                     MRN:          [id number]  DATE:02/18/2006                            DOB:          Jul 22, 1950    CHIEF COMPLAINT:  A 60 year old white male with refractory reflux  symptoms and a history of Barrett's esophagus.   HISTORY OF PRESENT ILLNESS:  [name] has had reflux problems for many  years, and had a short segment of Barrett's esophagus diagnosed in  January of 2002.  Repeat endoscopy in January of 2004 did not reveal  Barrett's esophagus on biopsy.  He states his reflux symptoms remained  under good control since I last saw him in 2002, but he notes over the  past several months AcipHex has not been adequate in controlling his  symptoms, and he was having frequent break through symptoms both during  the day and night.  He describes chest pain, burning, belching, and  regurgitation.  He has no dysphagia or odynophagia.  He was tried on  Nexium which led to worsening chest pain, and it was discontinued.  He  was recently changed to Protonix, which has helped control symptoms.  He  has also substantially modified his diet, and is more closely following  standard anti-reflux measures.  He has begun an intentional weight loss  diet.   His mother has a history of polyps, there is no family history of colon  cancer or inflammatory bowel disease.   CURRENT MEDICATIONS:  Listed on the chart, updated and reviewed.   ALLERGIES:  PRAVACHOL, BIAXIN, PENICILLIN, CIPRO, ALBUTEROL, FLOVENT,  VASOTEC AND NEXIUM.   PHYSICAL EXAMINATION:  Well-developed, well-nourished white male, no  acute distress.  Height 6 feet 1 inch, weight 138 pounds.  Blood  pressure is 100/70, pulse 102 and regular.  HEENT:  Anicteric sclerae, oropharynx clear.  CHEST:  Clear to auscultation bilaterally.  CARDIAC:  Regular rate and rhythm without murmurs appreciated.  ABDOMEN:   Soft, nontender, nondistended, normoactive bowel sounds, no  palpable organomegaly, masses, or hernias.  EXTREMITIES:  Without cyanosis, clubbing, or edema.  NEUROLOGIC:  Alert and oriented times 3.  Grossly nonfocal.   ASSESSMENT/PLAN:  1. Refractory reflux symptoms with a history of short segment      Barrett's esophagus.  Continue Protonix 40 mg p.o. q. a.m., and all      standard anti-reflux measures.  He is encouraged to continue his      weight loss diet.  Risks, benefits, and alternatives to upper      endoscopy with biopsy discussed with the patient.  He consents to      proceed.  This will be scheduled      electively.  2. Family history of colon polyps.  Plan for screening colonoscopy in      2008.     Venita Lick. Russella Dar, MD, [facility]  Electronically Signed    MTS/MedQ  DD: 02/18/2006  DT: 02/18/2006  Job #: [id number]   cc:   Georgina Quint. Plotnikov, MD

## 2010-07-15 ENCOUNTER — Other Ambulatory Visit (INDEPENDENT_AMBULATORY_CARE_PROVIDER_SITE_OTHER): Payer: BC Managed Care – PPO | Admitting: Internal Medicine

## 2010-07-15 ENCOUNTER — Other Ambulatory Visit: Payer: Self-pay | Admitting: *Deleted

## 2010-07-15 ENCOUNTER — Other Ambulatory Visit (INDEPENDENT_AMBULATORY_CARE_PROVIDER_SITE_OTHER): Payer: BC Managed Care – PPO

## 2010-07-15 DIAGNOSIS — E7800 Pure hypercholesterolemia, unspecified: Secondary | ICD-10-CM

## 2010-07-15 DIAGNOSIS — Z13220 Encounter for screening for lipoid disorders: Secondary | ICD-10-CM

## 2010-07-15 LAB — BASIC METABOLIC PANEL
BUN: 23 mg/dL (ref 6–23)
Chloride: 101 mEq/L (ref 96–112)
GFR: 65.57 mL/min (ref >60.00)
Glucose, Bld: 79 mg/dL (ref 70–99)
Potassium: 4.8 mEq/L (ref 3.5–5.1)

## 2010-07-15 LAB — HEPATIC FUNCTION PANEL
ALT: 42 U/L (ref 0–53)
AST: 29 U/L (ref 0–37)
Albumin: 4 g/dL (ref 3.5–5.2)
Total Protein: 7.3 g/dL (ref 6.0–8.3)

## 2010-07-15 LAB — LDL CHOLESTEROL, DIRECT: Direct LDL: 156.6 mg/dL

## 2010-07-15 MED ORDER — PANTOPRAZOLE SODIUM 40 MG PO TBEC: 40.0000 mg | DELAYED_RELEASE_TABLET | Freq: Every day | ORAL | Status: DC

## 2010-07-15 MED ORDER — AMLODIPINE-OLMESARTAN 5-40 MG PO TABS: 1.0000 | ORAL_TABLET | Freq: Every day | ORAL | Status: DC

## 2010-07-19 ENCOUNTER — Other Ambulatory Visit (INDEPENDENT_AMBULATORY_CARE_PROVIDER_SITE_OTHER): Payer: BC Managed Care – PPO | Admitting: Internal Medicine

## 2010-07-19 ENCOUNTER — Ambulatory Visit (INDEPENDENT_AMBULATORY_CARE_PROVIDER_SITE_OTHER): Payer: BC Managed Care – PPO | Admitting: Internal Medicine

## 2010-07-19 ENCOUNTER — Encounter: Payer: Self-pay | Admitting: Internal Medicine

## 2010-07-19 ENCOUNTER — Other Ambulatory Visit (INDEPENDENT_AMBULATORY_CARE_PROVIDER_SITE_OTHER): Payer: BC Managed Care – PPO

## 2010-07-19 DIAGNOSIS — I10 Essential (primary) hypertension: Secondary | ICD-10-CM

## 2010-07-19 DIAGNOSIS — R5383 Other fatigue: Secondary | ICD-10-CM | POA: Insufficient documentation

## 2010-07-19 DIAGNOSIS — R5381 Other malaise: Secondary | ICD-10-CM

## 2010-07-19 DIAGNOSIS — Z0000 Encounter for general adult medical examination without abnormal findings: Secondary | ICD-10-CM

## 2010-07-19 LAB — URINALYSIS, ROUTINE W REFLEX MICROSCOPIC
Bilirubin Urine: NEGATIVE
Hgb urine dipstick: NEGATIVE
Ketones, ur: NEGATIVE
Nitrite: NEGATIVE
Total Protein, Urine: NEGATIVE
pH: 5.5 (ref 5.0–8.0)

## 2010-07-19 LAB — CBC WITH DIFFERENTIAL/PLATELET
Basophils Absolute: 0 10*3/uL (ref 0.0–0.1)
Basophils Relative: 0.2 % (ref 0.0–3.0)
Eosinophils Absolute: 0 10*3/uL (ref 0.0–0.7)
Hemoglobin: 14.5 g/dL (ref 13.0–17.0)
Lymphocytes Relative: 16.2 % (ref 12.0–46.0)
MCHC: 34.3 g/dL (ref 30.0–36.0)
MCV: 88.2 fl (ref 78.0–100.0)
Monocytes Absolute: 0.7 10*3/uL (ref 0.1–1.0)
Neutro Abs: 5.4 10*3/uL (ref 1.4–7.7)
RDW: 14 % (ref 11.5–14.6)

## 2010-07-19 LAB — CORTISOL: Cortisol, Plasma: 1.6 ug/dL

## 2010-07-19 NOTE — Progress Notes (Signed)
  Subjective:    Patient ID: Corey [name], male    DOB: Jun 08, 1950, 60 y.o.   MRN: [id number]  HPI  C/o fatigue x 2 mo C/o lack of libido Loosing wt on diet 17 lbs F/u HTN, asthma  Review of Systems  Constitutional: Positive for fatigue. Negative for chills, diaphoresis, activity change and unexpected weight change.  HENT: Negative for nosebleeds and dental problem.   Eyes: Negative for pain.  Respiratory: Negative for chest tightness and shortness of breath.   Gastrointestinal: Negative for abdominal pain.  Genitourinary: Negative for hematuria.  Musculoskeletal: Negative for gait problem.  Skin: Negative for rash.  Neurological: Negative for tremors, weakness and numbness.  Psychiatric/Behavioral: Negative for suicidal ideas. The patient is not nervous/anxious.    Wt Readings from Last 3 Encounters:  07/19/10 224 lb (101.606 kg)  01/04/10 241 lb (109.317 kg)  05/23/09 239 lb (108.41 kg)       Objective:   Physical Exam  Constitutional: He is oriented to person, place, and time. He appears well-developed.  HENT:  Mouth/Throat: Oropharynx is clear and moist.  Eyes: Conjunctivae are normal. Pupils are equal, round, and reactive to light.  Neck: Normal range of motion. No JVD present. No thyromegaly present.  Cardiovascular: Normal rate, regular rhythm, normal heart sounds and intact distal pulses.  Exam reveals no gallop and no friction rub.   No murmur heard. Pulmonary/Chest: Effort normal and breath sounds normal. No respiratory distress. He has no wheezes. He has no rales. He exhibits no tenderness.  Abdominal: Soft. Bowel sounds are normal. He exhibits no distension and no mass. There is no tenderness. There is no rebound and no guarding.  Musculoskeletal: Normal range of motion. He exhibits no edema and no tenderness.  Lymphadenopathy:    He has no cervical adenopathy.  Neurological: He is alert and oriented to person, place, and time. He has normal reflexes. No  cranial nerve deficit. He exhibits normal muscle tone. Coordination normal.  Skin: Skin is warm and dry. No rash noted.  Psychiatric: He has a normal mood and affect. His behavior is normal. Judgment and thought content normal.        Lab Results  Component Value Date   WBC 7.3 07/19/2010   HGB 14.5 07/19/2010   HCT 42.2 07/19/2010   PLT 219.0 07/19/2010   CHOL 205* 07/15/2010   TRIG 93.0 07/15/2010   HDL 47.40 07/15/2010   LDLDIRECT 156.6 07/15/2010   ALT 42 07/15/2010   AST 29 07/15/2010   NA 136 07/15/2010   K 4.8 07/15/2010   CL 101 07/15/2010   CREATININE 1.2 07/15/2010   BUN 23 07/15/2010   CO2 27 07/15/2010   TSH 0.95 07/19/2010   PSA 0.71 12/28/2009   HGBA1C 5.9 05/16/2009     Assessment & Plan:  HYPERTENSION We can reduce Azor  Fatigue Get labs    Wt loss on diet  HTN  See Meds  Asthma  Doing well

## 2010-07-19 NOTE — Assessment & Plan Note (Signed)
Get labs

## 2010-07-19 NOTE — Assessment & Plan Note (Signed)
We can reduce Azor

## 2010-07-23 ENCOUNTER — Telehealth: Payer: Self-pay | Admitting: *Deleted

## 2010-07-23 DIAGNOSIS — R7989 Other specified abnormal findings of blood chemistry: Secondary | ICD-10-CM

## 2010-07-23 DIAGNOSIS — E2740 Unspecified adrenocortical insufficiency: Secondary | ICD-10-CM

## 2010-07-23 LAB — TESTOSTERONE, FREE, TOTAL, SHBG: Sex Hormone Binding: 32 nmol/L (ref 13–71)

## 2010-07-23 NOTE — Telephone Encounter (Signed)
Patient requesting results of labs from Friday.

## 2010-07-23 NOTE — Telephone Encounter (Signed)
Please, inform patient that all labs are OK, except for low cortisol. Please schedule a cortrosyn stimulation  test: 1. Draw cortisol in am 2. Go to [facility] and get an injection of Cortrosyn 0.25 mg IM 3. Go back to lab to draw another Cortisol level in 1 hour.  Fatigue could be post-viral  Please, mail the labs to the patient.    Thx

## 2010-07-24 NOTE — Telephone Encounter (Signed)
Will need to discuss with [facility] tomorrow how to set up ordering and scheduling of this test. Misty Stanley please help.   Pt is aware of results and copy of labs were mailed. He is aware that test most likely will be next week and is expecting a call from the office to schedule.

## 2010-07-26 NOTE — Telephone Encounter (Signed)
Will schedule nurse visit and enter lab order.

## 2010-08-01 ENCOUNTER — Other Ambulatory Visit: Payer: Self-pay | Admitting: Surgery

## 2010-08-16 ENCOUNTER — Ambulatory Visit: Payer: BC Managed Care – PPO

## 2010-08-19 ENCOUNTER — Other Ambulatory Visit (INDEPENDENT_AMBULATORY_CARE_PROVIDER_SITE_OTHER): Payer: BC Managed Care – PPO

## 2010-08-19 ENCOUNTER — Other Ambulatory Visit: Payer: BC Managed Care – PPO

## 2010-08-19 ENCOUNTER — Ambulatory Visit (INDEPENDENT_AMBULATORY_CARE_PROVIDER_SITE_OTHER): Payer: BC Managed Care – PPO | Admitting: *Deleted

## 2010-08-19 DIAGNOSIS — E2749 Other adrenocortical insufficiency: Secondary | ICD-10-CM

## 2010-08-19 DIAGNOSIS — R5383 Other fatigue: Secondary | ICD-10-CM

## 2010-08-19 DIAGNOSIS — E2740 Unspecified adrenocortical insufficiency: Secondary | ICD-10-CM

## 2010-08-19 DIAGNOSIS — R5381 Other malaise: Secondary | ICD-10-CM

## 2010-08-19 LAB — CORTISOL
Cortisol, Plasma: 13.1 ug/dL
Cortisol, Plasma: 20.9 ug/dL

## 2010-08-19 MED ORDER — COSYNTROPIN 0.25 MG IJ SOLR
0.2500 mg | Freq: Once | INTRAMUSCULAR | Status: AC
  Administered 2010-08-19: 0.25 mg via INTRAMUSCULAR

## 2010-08-22 ENCOUNTER — Telehealth: Payer: Self-pay | Admitting: Internal Medicine

## 2010-08-22 NOTE — Telephone Encounter (Signed)
Corey [name], please, inform patient that his adrenal test was normal. Thx

## 2010-08-22 NOTE — Telephone Encounter (Signed)
Pt informed. He states he has no energy.  He wants further evaluation and answers.  Please advise.

## 2010-08-23 ENCOUNTER — Telehealth: Payer: Self-pay | Admitting: *Deleted

## 2010-08-23 DIAGNOSIS — R5383 Other fatigue: Secondary | ICD-10-CM

## 2010-08-23 NOTE — Telephone Encounter (Signed)
Pt wants a ref to an Actor. He would like to go see Dr Agricultural consultant.

## 2010-08-26 NOTE — Telephone Encounter (Signed)
ok 

## 2010-08-27 ENCOUNTER — Telehealth: Payer: Self-pay | Admitting: *Deleted

## 2010-08-27 DIAGNOSIS — J45998 Other asthma: Secondary | ICD-10-CM

## 2010-08-27 NOTE — Telephone Encounter (Signed)
He was ref to Dr Altheimer at his request Thx

## 2010-08-27 NOTE — Telephone Encounter (Signed)
Patient informed. 

## 2010-08-27 NOTE — Telephone Encounter (Signed)
Patient requesting referral to allergist - wants to go to "[facility]"

## 2010-08-27 NOTE — Telephone Encounter (Signed)
Ok Done Thx 

## 2010-10-03 ENCOUNTER — Other Ambulatory Visit: Payer: Self-pay | Admitting: Internal Medicine

## 2010-10-03 ENCOUNTER — Encounter: Payer: Self-pay | Admitting: Internal Medicine

## 2010-10-03 ENCOUNTER — Other Ambulatory Visit: Payer: BC Managed Care – PPO

## 2010-10-03 ENCOUNTER — Ambulatory Visit (INDEPENDENT_AMBULATORY_CARE_PROVIDER_SITE_OTHER): Payer: BC Managed Care – PPO | Admitting: Internal Medicine

## 2010-10-03 VITALS — BP 120/72 | HR 86 | Ht 74.0 in | Wt 218.4 lb

## 2010-10-03 DIAGNOSIS — L508 Other urticaria: Secondary | ICD-10-CM | POA: Insufficient documentation

## 2010-10-03 NOTE — Progress Notes (Signed)
Subjective:    Patient ID: Corey [name], male    DOB: [birth year], 60 y.o.   MRN: [id number]  HPI 10/03/10- 60 year old male never smoker seen in allergy consultation because of hives at the kind request of Dr.Plotnikov.  In childhood he had allergic rhinitis and some asthma. He was on allergy vaccine many years ago from Dr. Stevphen Rochester. He notices little rhinitis or asthma at this time in his life.    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 It is not clearly seasonal. He wants help identifying potential triggers. Without very strong association, he suspects tomatoes, corn, peanut butter, shellfish, chocolate and strawberry. He avoids alcohol, thinking he may be "allergic". Now he feels adequately controlled taking two Zyrtec tablets daily for hives. He had gone to Dr. Terri Piedra for dermatology evaluation and biopsy diagnosed hives. Dr. Terri Piedra suggested a current allergy evaluation. He gives history of intolerance to latex. Aspirin causes bruising. Prednisone last taken in 2011. There is history of abnormal liver function tests. He avoids ACE inhibitors. Lab review: Normal CBC 0.6% eosinophils, recent normal liver function tests. Family history: Others with allergy and asthma but not urticaria. 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 Review of Systems Constitutional:   No-   weight loss, night sweats, fevers, chills, fatigue, lassitude. HEENT:   No-  headaches, difficulty swallowing, tooth/dental problems, sore throat,       No-  sneezing, itching, ear ache, nasal congestion, post nasal drip,  CV:  No-   chest pain, orthopnea, PND, swelling in lower extremities, anasarca, izziness, palpitations Resp: No-   shortness of breath with exertion or at rest.              No-   productive cough,  No non-productive cough,  No-  coughing up of blood.              No-   change in color of mucus.  No- wheezing.    Skin: HPI GI:  Controlled-   heartburn, indigestion,     ,             no- nausea, vomiting, diarrhea, abdominal pain,                change in bowel habits, or loss of appetite GU: No-   dysuria, change in color of urine, no urgency or frequency.  No- flank pain. MS:  No-   joint pain or swelling.  No- decreased range of motion.  No- back pain. Neuro- grossly normal to observation, Or:  Psych:  No- change in mood or affect. No depression or anxiety.  No memory loss.      Objective:   Physical Exam General- Alert, Oriented, Affect-appropriate, Distress- none acute; medium build Skin- rash-none, lesions- none, excoriation- none            no urticaria at this time. Lymphadenopathy- none Head- atraumatic            Eyes- Gross vision intact, PERRLA, conjunctivae clear secretions            Ears- Hearing, canals normal            Nose- Clear, no-Septal dev, mucus, polyps, erosion, perforation             Throat- Mallampati III , mucosa clear , drainage- none, tonsils- atrophic Neck- flexible , trachea midline, no stridor , thyroid nl, carotid no bruit Chest - symmetrical excursion ,  unlabored           Heart/CV- RRR , no murmur , no gallop  , no rub, nl s1 s2                           - JVD- none , edema- none, stasis changes- none, varices- none           Lung- clear to P&A, wheeze- none, cough- none , dullness-none, rub- none           Chest wall-  Abd- tender-no, distended-no, bowel sounds-present, HSM- no Br/ Gen/ Rectal- Not done, not indicated Extrem- cyanosis- none, clubbing, none, atrophy- none, strength- nl Neuro- grossly intact to observation         Assessment & Plan:

## 2010-10-03 NOTE — Patient Instructions (Addendum)
Order- lab-   Full Allergy profile- 4660                    Dx urticaria                      Food IgE panel 4630                      Food IgG panel 4557                      Angioedema panel  16109

## 2010-10-03 NOTE — Assessment & Plan Note (Addendum)
When cause is not immediately obvious, search for underlying etiology of chronic urticaria is successful less than 50% of the time. He has a strong atopic history, but making an IgE mediated problem more likely. He says prior skin test reactions on allergy testing were very strong and he hopes we can do in vitro testing. We will look in the direction of his concern and evaluate for IgE / food related problems.  Plan lab for allergy profile, angioedema panel, food allergy panels, both IgE and IgG. We discussed use of a food diary. He will continue daily antihistamine.

## 2010-10-04 LAB — ALLERGY FULL AND FOOD SPECIFIC PROFILE
Allergen, D pternoyssinus,d7: 1.06 kU/L — ABNORMAL HIGH (ref <0.35)
Aspergillus fumigatus, IgG: <0.1 kU/L (ref <0.35)
Box Elder IgE: 0.18 kU/L (ref <0.35)
Candida Albicans: <0.1 kU/L (ref <0.35)
Chicken IgE: <0.1 kU/L (ref <0.35)
Common Ragweed: 0.23 kU/L (ref <0.35)
D. farinae: 1.08 kU/L — ABNORMAL HIGH (ref <0.35)
Egg White IgE: 0.13 kU/L (ref <0.35)
Elm IgE: 0.69 kU/L — ABNORMAL HIGH (ref <0.35)
G005 Rye, Perennial: 0.16 kU/L (ref <0.35)
G009 Red Top: 0.11 kU/L (ref <0.35)
House Dust Hollister: 1.57 kU/L — ABNORMAL HIGH (ref <0.35)
Milk IgE: 0.14 kU/L (ref <0.35)
Oak: 0.14 kU/L (ref <0.35)
Orange: <0.1 kU/L (ref <0.35)
Plantain: <0.1 kU/L (ref <0.35)
Shrimp IgE: 0.13 kU/L (ref <0.35)
Timothy Grass: <0.1 kU/L (ref <0.35)
Tuna IgE: <0.1 kU/L (ref <0.35)

## 2010-10-07 ENCOUNTER — Other Ambulatory Visit: Payer: Self-pay | Admitting: Internal Medicine

## 2010-11-04 ENCOUNTER — Ambulatory Visit (INDEPENDENT_AMBULATORY_CARE_PROVIDER_SITE_OTHER): Payer: BC Managed Care – PPO | Admitting: Internal Medicine

## 2010-11-04 ENCOUNTER — Encounter: Payer: Self-pay | Admitting: Internal Medicine

## 2010-11-04 VITALS — BP 130/68 | HR 85 | Ht 74.0 in | Wt 216.2 lb

## 2010-11-04 DIAGNOSIS — Z23 Encounter for immunization: Secondary | ICD-10-CM

## 2010-11-04 DIAGNOSIS — L508 Other urticaria: Secondary | ICD-10-CM

## 2010-11-04 MED ORDER — EPINEPHRINE 0.3 MG/0.3ML IJ DEVI: 0.3000 mg | Freq: Once | INTRAMUSCULAR | Status: AC | PRN

## 2010-11-04 NOTE — Assessment & Plan Note (Signed)
Detailed discussion of the lab results. He understands the uncertainties. Peanuts will cause anaphyllaxis. Chocolate causes rash. Strawberries did cause itch and are avoided.

## 2010-11-04 NOTE — Progress Notes (Signed)
Subjective:    Patient ID: Corey [name], male    DOB: Jun 15, 1950, 60 y.o.   MRN: [id number]  HPI    Review of Systems     Objective:   Physical Exam        Assessment & Plan:   Subjective:    Patient ID: Corey [name], male    DOB: 12-12-1950, 60 y.o.   MRN: [id number]  HPI 10/03/10- 60 year old male never smoker seen in allergy consultation because of hives at the kind request of Dr.Plotnikov.  In childhood he had allergic rhinitis and some asthma. He was on allergy vaccine many years ago from Dr. Stevphen Rochester. He notices little rhinitis or asthma at this time in his life.    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 It is not clearly seasonal. He wants help identifying potential triggers. Without very strong association, he suspects tomatoes, corn, peanut butter, shellfish, chocolate and strawberry. He avoids alcohol, thinking he may be "allergic". Now he feels adequately controlled taking two Zyrtec tablets daily for hives. He had gone to Dr. Terri Piedra for dermatology evaluation and biopsy diagnosed hives. Dr. Terri Piedra suggested a current allergy evaluation. He gives history of intolerance to latex. Aspirin causes bruising. Prednisone last taken in 2011. There is history of abnormal liver function tests. He avoids ACE inhibitors. Lab review: Normal CBC 0.6% eosinophils, recent normal liver function tests. Family history: Others with allergy and asthma but not urticaria. 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  11/04/10- 60 year old never smoker followed for Urticaria with history of childhood asthma rhinitis and urticaria. Food allergy. Allergy profile 10/07/2010-total IgG 77.7. Specific elevations for dust mite cat and dog dander Angioedema panel-normal for C1 esterase inhibitor and complement IgG food panel-elevated for wheat which he tolerates without problem, elevated for milk and  peanut with known anaphylaxis to peanuts, elevated for beef and egg white without recognized symptoms. IgE food panel unremarkable. Doing well currently with no active hives.  He does fine with wheat, eggwhite, but will avoid/ be very observant avoid those products that cause problems.   Review of Systems Constitutional:   No-   weight loss, night sweats, fevers, chills, fatigue, lassitude. HEENT:   No-  headaches, difficulty swallowing, tooth/dental problems, sore throat,       No-  sneezing, itching, ear ache, nasal congestion, post nasal drip,  CV:  No-   chest pain, orthopnea, PND, swelling in lower extremities, anasarca, izziness, palpitations Resp: No-   shortness of breath with exertion or at rest.              No-   productive cough,  No non-productive cough,  No-  coughing up of blood.              No-   change in color of mucus.  No- wheezing.   Skin: HPI GI:  Controlled-   heartburn, indigestion,     ,             no- nausea, vomiting, diarrhea, abdominal pain,                change in bowel habits, or loss of appetite GU: No-   dysuria, change in color of urine, no urgency or frequency.  No- flank pain. MS:  No-   joint pain or swelling.  No- decreased range of motion.  No- back pain. Neuro- grossly normal to observation, Or:  Psych:  No-  change in mood or affect. No depression or anxiety.  No memory loss.      Objective:   Physical Exam General- Alert, Oriented, Affect-appropriate, Distress- none acute; medium build Skin- rash-none, lesions- none, excoriation- none            no urticaria at this time. Lymphadenopathy- none Head- atraumatic            Eyes- Gross vision intact, PERRLA, conjunctivae clear secretions            Ears- Hearing, canals normal            Nose- Clear, no-Septal dev, mucus, polyps, erosion, perforation             Throat- Mallampati III , mucosa clear , drainage- none, tonsils- atrophic Neck- flexible , trachea midline, no stridor , thyroid nl,  carotid no bruit Chest - symmetrical excursion , unlabored           Heart/CV- RRR , no murmur , no gallop  , no rub, nl s1 s2                           - JVD- none , edema- none, stasis changes- none, varices- none           Lung- clear to P&A, wheeze- none, cough- none , dullness-none, rub- none           Chest wall-  Abd- tender-no, distended-no, bowel sounds-present, HSM- no Br/ Gen/ Rectal- Not done, not indicated Extrem- cyanosis- none, clubbing, none, atrophy- none, strength- nl Neuro- grossly intact to observation         Assessment & Plan:

## 2010-11-04 NOTE — Patient Instructions (Signed)
Script for Epipen  Avoid those foods that seem to be triggers

## 2010-11-18 ENCOUNTER — Telehealth: Payer: Self-pay | Admitting: Internal Medicine

## 2010-11-18 NOTE — Telephone Encounter (Signed)
Spoke with pt and he states had blood tests for allergies have been denied by [facility] - they stated in the letter mailed to pt that the tests were "experimental" and pt disagrees and plans to file appeal. He wants to know if CDY would dictate a letter for him to mail along with his appeal. He wants letter mailed to him. Please advise, thanks!

## 2010-11-19 NOTE — Telephone Encounter (Signed)
CY would like to have copy of letter for reason of denial and he doesn't mind getting letter together for patient at all. I spoke with patient regarding this matter and he is faxing me a copy of the letter to put with this message for CY to address.

## 2010-11-25 NOTE — Telephone Encounter (Signed)
I have placed the faxed information on CY's cart to review and create letter for patient.

## 2010-12-04 NOTE — Telephone Encounter (Signed)
[name], do you know if this has been taken care of yet?  Pls advise.  Thanks! 

## 2010-12-09 NOTE — Letter (Addendum)
December 08, 2010   [place] [address] [place], [place] [address]  RE: Denial of coverage.  RE:  [place], Tyion MRN:  [id number]  /  DOB:  [birth year]  Dear Mr. [place]:  In responding to your request for evaluation for food allergy, we had sent blood tests to our reference lab, [facility].  Based on your description and concern, I had sent requests for IgE and IgG food allergy assessment.  Our laboratory listing catalog lists IgG food panel (code [id number]) without indication that it was not covered.  I had no reason to understand that it would be handled differently and I ordered that study feeling it would provide a clearer assessment of possible allergic reaction to foods.   Sincerely,     Quamaine Webb D. Maple Hudson, MD, [facility]oll[facility], FACP Electronically Signed   CDY/MedQ  DD: 12/08/2010  DT: 12/09/2010  Job #: [id number]

## 2010-12-11 NOTE — Telephone Encounter (Signed)
Letter was dictated and has been signed electronically.

## 2010-12-12 NOTE — Telephone Encounter (Signed)
Spoke with Synetta Fail and she is Catering manager for me to get to patient asap.

## 2010-12-12 NOTE — Telephone Encounter (Signed)
Will route to [name] per her request 

## 2010-12-13 NOTE — Telephone Encounter (Signed)
Pt is aware that we have sent letter to him via mail. I confirmed his home/mailing address.

## 2010-12-19 ENCOUNTER — Other Ambulatory Visit: Payer: Self-pay | Admitting: Internal Medicine

## 2011-01-20 ENCOUNTER — Ambulatory Visit: Payer: BC Managed Care – PPO | Admitting: Internal Medicine

## 2011-01-23 ENCOUNTER — Telehealth: Payer: Self-pay | Admitting: Internal Medicine

## 2011-01-23 DIAGNOSIS — Z0389 Encounter for observation for other suspected diseases and conditions ruled out: Secondary | ICD-10-CM

## 2011-01-23 NOTE — Telephone Encounter (Signed)
Labs entered.

## 2011-03-06 ENCOUNTER — Other Ambulatory Visit (INDEPENDENT_AMBULATORY_CARE_PROVIDER_SITE_OTHER): Payer: BC Managed Care – PPO

## 2011-03-06 ENCOUNTER — Other Ambulatory Visit: Payer: Self-pay | Admitting: Internal Medicine

## 2011-03-06 DIAGNOSIS — R5383 Other fatigue: Secondary | ICD-10-CM

## 2011-03-06 DIAGNOSIS — Z0389 Encounter for observation for other suspected diseases and conditions ruled out: Secondary | ICD-10-CM

## 2011-03-06 LAB — LIPID PANEL
Cholesterol: 236 mg/dL — ABNORMAL HIGH (ref 0–200)
HDL: 47.1 mg/dL (ref >39.00)
Triglycerides: 139 mg/dL (ref 0.0–149.0)
VLDL: 27.8 mg/dL (ref 0.0–40.0)

## 2011-03-06 LAB — CBC
RBC: 5.09 Mil/uL (ref 4.22–5.81)
WBC: 5.3 10*3/uL (ref 4.5–10.5)

## 2011-03-06 LAB — URINALYSIS
Bilirubin Urine: NEGATIVE
Hgb urine dipstick: NEGATIVE
Ketones, ur: NEGATIVE
Urine Glucose: NEGATIVE
Urobilinogen, UA: 0.2 (ref 0.0–1.0)

## 2011-03-06 LAB — BASIC METABOLIC PANEL
BUN: 20 mg/dL (ref 6–23)
GFR: 78.79 mL/min (ref >60.00)
Potassium: 4.2 mEq/L (ref 3.5–5.1)

## 2011-03-06 LAB — HEPATIC FUNCTION PANEL
ALT: 21 U/L (ref 0–53)
Bilirubin, Direct: 0.1 mg/dL (ref 0.0–0.3)
Total Protein: 7.7 g/dL (ref 6.0–8.3)

## 2011-03-06 LAB — TSH: TSH: 1.35 u[IU]/mL (ref 0.35–5.50)

## 2011-03-06 LAB — PSA: PSA: 0.8 ng/mL (ref 0.10–4.00)

## 2011-03-13 ENCOUNTER — Encounter: Payer: Self-pay | Admitting: Internal Medicine

## 2011-03-13 ENCOUNTER — Ambulatory Visit (INDEPENDENT_AMBULATORY_CARE_PROVIDER_SITE_OTHER): Payer: BC Managed Care – PPO | Admitting: Internal Medicine

## 2011-03-13 VITALS — BP 130/80 | HR 80 | Temp 98.7°F | Resp 16 | Ht 74.0 in | Wt 206.0 lb

## 2011-03-13 DIAGNOSIS — L508 Other urticaria: Secondary | ICD-10-CM

## 2011-03-13 DIAGNOSIS — J45909 Unspecified asthma, uncomplicated: Secondary | ICD-10-CM

## 2011-03-13 DIAGNOSIS — Z0000 Encounter for general adult medical examination without abnormal findings: Secondary | ICD-10-CM

## 2011-03-13 DIAGNOSIS — I10 Essential (primary) hypertension: Secondary | ICD-10-CM

## 2011-03-13 MED ORDER — AMLODIPINE-OLMESARTAN 5-40 MG PO TABS: 1.0000 | ORAL_TABLET | Freq: Every day | ORAL | Status: DC

## 2011-03-13 MED ORDER — PANTOPRAZOLE SODIUM 40 MG PO TBEC: 40.0000 mg | DELAYED_RELEASE_TABLET | Freq: Every day | ORAL | Status: DC

## 2011-03-13 MED ORDER — PRAMOXINE-HC 1-2.5 % EX OINT: TOPICAL_OINTMENT | CUTANEOUS | Status: DC

## 2011-03-13 NOTE — Assessment & Plan Note (Signed)
Biopsy diagnosis by Dr. Terri Piedra. Onset around 2008 Allergy profiles 10/07/2010-elevated for dust mite cat and dog dander [place] trees with total IgE 77.7 Food allergy profile 10/07/2010: Food IgE unremarkable. Food IgG elevated for beef egg white peanut milk wheat corn. Of these only peanut has caused anaphylaxis. Angioedema panel 10/03/2010 showed normal C1 esterase inhibitor and complement.

## 2011-03-13 NOTE — Assessment & Plan Note (Signed)
Continue with current prescription therapy as reflected on the Med list.  

## 2011-03-13 NOTE — Assessment & Plan Note (Signed)
He appears well, in no apparent distress.  Alert and oriented times three, pleasant and cooperative. Vital signs are as documented in vital signs section. Zostavax recommended

## 2011-03-13 NOTE — Progress Notes (Signed)
  Subjective:    Patient ID: Corey [name], male    DOB: 1950/03/29, 61 y.o.   MRN: [id number]  HPI  The patient is here for a wellness exam. The patient has been doing well overall without major physical or psychological issues going on lately.   Review of Systems  Constitutional: Negative for appetite change, fatigue and unexpected weight change.  HENT: Negative for nosebleeds, congestion, sore throat, sneezing, trouble swallowing and neck pain.   Eyes: Negative for itching and visual disturbance.  Respiratory: Negative for cough.   Cardiovascular: Negative for chest pain, palpitations and leg swelling.  Gastrointestinal: Negative for nausea, diarrhea, blood in stool and abdominal distention.  Genitourinary: Negative for frequency and hematuria.  Musculoskeletal: Negative for back pain, joint swelling and gait problem.  Skin: Negative for rash.  Neurological: Negative for dizziness, tremors, speech difficulty and weakness.  Psychiatric/Behavioral: Negative for sleep disturbance, dysphoric mood and agitation. The patient is not nervous/anxious.        Objective:   Physical Exam  Constitutional: He is oriented to person, place, and time. He appears well-developed.  HENT:  Mouth/Throat: Oropharynx is clear and moist.  Eyes: Conjunctivae are normal. Pupils are equal, round, and reactive to light.  Neck: Normal range of motion. No JVD present. No thyromegaly present.  Cardiovascular: Normal rate, regular rhythm, normal heart sounds and intact distal pulses.  Exam reveals no gallop and no friction rub.   No murmur heard. Pulmonary/Chest: Effort normal and breath sounds normal. No respiratory distress. He has no wheezes. He has no rales. He exhibits no tenderness.  Abdominal: Soft. Bowel sounds are normal. He exhibits no distension and no mass. There is no tenderness. There is no rebound and no guarding.  Musculoskeletal: Normal range of motion. He exhibits no edema and no tenderness.   Lymphadenopathy:    He has no cervical adenopathy.  Neurological: He is alert and oriented to person, place, and time. He has normal reflexes. No cranial nerve deficit. He exhibits normal muscle tone. Coordination normal.  Skin: Skin is warm and dry. No rash noted.  Psychiatric: He has a normal mood and affect. His behavior is normal. Judgment and thought content normal.   Lab Results  Component Value Date   WBC 5.3 03/06/2011   HGB 15.1 03/06/2011   HCT 44.5 03/06/2011   PLT 214.0 03/06/2011   GLUCOSE 92 03/06/2011   CHOL 236* 03/06/2011   TRIG 139.0 03/06/2011   HDL 47.10 03/06/2011   LDLDIRECT 164.6 03/06/2011   LDLCALC 113* 11/14/2008   ALT 21 03/06/2011   AST 25 03/06/2011   NA 137 03/06/2011   K 4.2 03/06/2011   CL 100 03/06/2011   CREATININE 1.0 03/06/2011   BUN 20 03/06/2011   CO2 28 03/06/2011   TSH 1.35 03/06/2011   PSA 0.80 03/06/2011   HGBA1C 5.9 05/16/2009          Assessment & Plan:

## 2011-03-25 ENCOUNTER — Telehealth: Payer: Self-pay | Admitting: *Deleted

## 2011-03-25 DIAGNOSIS — Z0000 Encounter for general adult medical examination without abnormal findings: Secondary | ICD-10-CM

## 2011-03-25 NOTE — Telephone Encounter (Signed)
CPE in 02/2012. Labs entered.

## 2011-11-04 ENCOUNTER — Telehealth: Payer: Self-pay | Admitting: Internal Medicine

## 2011-11-04 NOTE — Telephone Encounter (Signed)
Attempted to call pt x's 3 to make next ov per recall.  Left message x's 3.  Mailed recall letter on 10/28/11.  Did not hear back from pt Corey [name]  °

## 2012-02-16 ENCOUNTER — Ambulatory Visit (INDEPENDENT_AMBULATORY_CARE_PROVIDER_SITE_OTHER): Payer: BC Managed Care – PPO | Admitting: Internal Medicine

## 2012-02-16 ENCOUNTER — Encounter: Payer: Self-pay | Admitting: Internal Medicine

## 2012-02-16 VITALS — BP 122/82 | HR 92 | Temp 97.9°F | Ht 74.0 in | Wt 217.8 lb

## 2012-02-16 DIAGNOSIS — M7062 Trochanteric bursitis, left hip: Secondary | ICD-10-CM

## 2012-02-16 DIAGNOSIS — M76899 Other specified enthesopathies of unspecified lower limb, excluding foot: Secondary | ICD-10-CM

## 2012-02-16 MED ORDER — PROMETHAZINE HCL 12.5 MG PO TABS: 25.0000 mg | ORAL_TABLET | Freq: Three times a day (TID) | ORAL | Status: DC | PRN

## 2012-02-16 MED ORDER — PREDNISONE 20 MG PO TABS: 20.0000 mg | ORAL_TABLET | Freq: Every day | ORAL | Status: DC

## 2012-02-16 NOTE — Progress Notes (Signed)
Subjective:    Patient ID: Corey [name], male    DOB: October 08, 1950, 61 y.o.   MRN: [id number]  HPI  Pt presents to the clinic today with c/o left hip pain x 3 days. The pain is intermittent and worse when he stands up, walks up stairs. The pain is 8/10 and he does have nausea and vomiting with the pain. He denies any injury to the area but did say on Saturday he worked out in the yard all day getting ready for a party. He has not taken anything for the pain.   Review of Systems  Past Medical History  Diagnosis Date  . Allergic rhinitis   . Asthma   . GERD (gastroesophageal reflux disease)   . Barrett's esophagus   . Hyperlipidemia   . HTN (hypertension)   . Nephrolithiasis     Current Outpatient Prescriptions  Medication Sig Dispense Refill  . amLODipine-olmesartan (AZOR) 5-40 MG per tablet Take 1 tablet by mouth daily.  90 tablet  2  . cetirizine (ZYRTEC) 10 MG tablet Take 10 mg by mouth 2 (two) times daily.       . Cholecalciferol (VITAMIN D3) 1000 UNITS tablet Take 1,000 Units by mouth daily.        . pantoprazole (PROTONIX) 40 MG tablet Take 1 tablet (40 mg total) by mouth daily.  90 tablet  3  . Pramoxine-HC (PRAMOSONE) 1-2.5 % OINT Use bid  28.4 g  4    Allergies  Allergen Reactions  . Albuterol     REACTION: "feels like drowning"  . Ciprofloxacin     REACTION: rash  . Clarithromycin     REACTION: swelling  . Colesevelam     REACTION: constip  . Enalapril Maleate     REACTION: Erectile dysfunction  . Esomeprazole Magnesium     REACTION: epigastric pain  . Niacin     REACTION: diarrhea  . Penicillins     REACTION: rash  . Pravastatin Sodium     REACTION: aching, listless  . Rosuvastatin     REACTION: achy    Family History  Problem Relation Age of Onset  . Hypertension    . Colon polyps Mother   . Heart disease Mother     History   Social History  . Marital Status: Single    Spouse Name: N/A    Number of Children: N/A  . Years of Education:  N/A   Occupational History  . 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 Smoking status: Never Smoker   . Smokeless tobacco: Not on file  . Alcohol Use: No  . Drug Use: No  . Sexually Active: Not Currently   Other Topics Concern  . Not on file   Social History Narrative   SingleRegular Exercise-yesOccupation: Tree surgeon for [employer]     Constitutional: Denies fever, malaise, fatigue, headache or abrupt weight changes.  Musculoskeletal: Pt reports hip pain. Denies decrease in range of motion, difficulty with gait, muscle pain or joint pain and swelling.  Skin: Denies redness, rashes, lesions or ulcercations.  Neurological: Denies dizziness, difficulty with memory, difficulty with speech or problems with balance and coordination.   No other specific complaints in a complete review of systems (except as listed in HPI above).     Objective:   Physical Exam  BP 122/82  Pulse 92  Temp 97.9 F (36.6 C) (Oral)  Ht 6\' 2"  (1.88 m)  Wt 217 lb 12  oz (98.771 kg)  BMI 27.96 kg/m2  SpO2 96% Wt Readings from Last 3 Encounters:  02/16/12 217 lb 12 oz (98.771 kg)  03/13/11 206 lb (93.441 kg)  11/04/10 216 lb 3.2 oz (98.068 kg)    General: Appears his stated age, well developed, well nourished in NAD. Cardiovascular: Normal rate and rhythm. S1,S2 noted.  No murmur, rubs or gallops noted. No JVD or BLE edema. No carotid bruits noted. Pulmonary/Chest: Normal effort and positive vesicular breath sounds. No respiratory distress. No wheezes, rales or ronchi noted.  Musculoskeletal: Pinpoint tenderness on the trochanter. Normal range of motion. No signs of joint swelling. No difficulty with gait.  Neurological: Alert and oriented. Cranial nerves II-XII intact. Coordination normal. +DTRs bilaterally.        Assessment & Plan:   Hip pain, due to trochanteric bursitis, new onset with additional workup required:  Prednisone 20 mg daily for  7 days Phenergen for hip pain May benefit from hip injection  RTC as needed or if symptoms persist

## 2012-02-16 NOTE — Patient Instructions (Addendum)

## 2012-03-18 ENCOUNTER — Encounter: Payer: BC Managed Care – PPO | Admitting: Internal Medicine

## 2012-03-22 ENCOUNTER — Ambulatory Visit (INDEPENDENT_AMBULATORY_CARE_PROVIDER_SITE_OTHER): Payer: BC Managed Care – PPO | Admitting: Internal Medicine

## 2012-03-22 ENCOUNTER — Encounter: Payer: Self-pay | Admitting: Internal Medicine

## 2012-03-22 VITALS — BP 160/84 | HR 80 | Temp 97.7°F | Resp 16 | Wt 221.0 lb

## 2012-03-22 DIAGNOSIS — M7062 Trochanteric bursitis, left hip: Secondary | ICD-10-CM

## 2012-03-22 DIAGNOSIS — M76899 Other specified enthesopathies of unspecified lower limb, excluding foot: Secondary | ICD-10-CM

## 2012-03-22 MED ORDER — METHYLPREDNISOLONE ACETATE 80 MG/ML IJ SUSP: 80.0000 mg | Freq: Once | INTRAMUSCULAR | Status: DC

## 2012-03-22 MED ORDER — DICLOFENAC SODIUM 1 % TD GEL: 4.0000 g | Freq: Four times a day (QID) | TRANSDERMAL | Status: DC

## 2012-03-22 NOTE — Patient Instructions (Addendum)
IT band stretch   Postprocedure instructions :    A Band-Aid should be left on for 12 hours. Injection therapy is not a cure itself. It is used in conjunction with other modalities. You can use nonsteroidal anti-inflammatories like ibuprofen , hot and cold compresses. Rest is recommended in the next 24 hours. You need to report immediately  if fever, chills or any signs of infection develop.

## 2012-03-22 NOTE — Assessment & Plan Note (Signed)
Will inj Volt gel

## 2012-03-22 NOTE — Progress Notes (Signed)
Procedure Note :     Procedure : Joint Injection,  L  hip   Indication:  Trochanteric bursitis with refractory  chronic pain.   Risks including unsuccessful procedure , bleeding, infection, bruising, skin atrophy and others were explained to the patient in detail as well as the benefits. Informed consent was obtained and signed.   Tthe patient was placed in a comfortable lateral decubitus position. The point of maximal tenderness was identified. Skin was prepped with Betadine and alcohol. Then, a 5 cc syringe with a 2 inch long 24-gauge needle was used for a bursa injection.. The needle was advanced  Into the bursa. I injected the bursa with 4 mL of 2% lidocaine and 80 mg of Depo-Medrol .  Band-Aid was applied.   Tolerated well. Complications: None. Good pain relief following the procedure.   Subjective:    Patient ID: Corey [name], male    DOB: 1950-11-29, 62 y.o.   MRN: [id number]  HPI  Pt presents to the clinic today with c/o left hip pain x 30+ days. The pain is intermittent and worse when he stands up, walks up stairs. The pain is 7-8/10 and he does have nausea and vomiting with the pain. He denies any injury to the area. He has  taken prednisone for the pain w/a little help.   Review of Systems  Past Medical History  Diagnosis Date  . Allergic rhinitis   . Asthma   . GERD (gastroesophageal reflux disease)   . Barrett's esophagus   . Hyperlipidemia   . HTN (hypertension)   . Nephrolithiasis     Current Outpatient Prescriptions  Medication Sig Dispense Refill  . amLODipine-olmesartan (AZOR) 5-40 MG per tablet Take 1 tablet by mouth daily.  90 tablet  2  . cetirizine (ZYRTEC) 10 MG tablet Take 10 mg by mouth 2 (two) times daily.       . Cholecalciferol (VITAMIN D3) 1000 UNITS tablet Take 1,000 Units by mouth daily.        . pantoprazole (PROTONIX) 40 MG tablet Take 1 tablet (40 mg total) by mouth daily.  90 tablet  3  . Pramoxine-HC (PRAMOSONE) 1-2.5 % OINT Use bid   28.4 g  4    Allergies  Allergen Reactions  . Albuterol     REACTION: "feels like drowning"  . Ciprofloxacin     REACTION: rash  . Clarithromycin     REACTION: swelling  . Colesevelam     REACTION: constip  . Enalapril Maleate     REACTION: Erectile dysfunction  . Esomeprazole Magnesium     REACTION: epigastric pain  . Niacin     REACTION: diarrhea  . Penicillins     REACTION: rash  . Pravastatin Sodium     REACTION: aching, listless  . Rosuvastatin     REACTION: achy    Family History  Problem Relation Age of Onset  . Hypertension    . Colon polyps Mother   . Heart disease Mother     History   Social History  . Marital Status: Single    Spouse Name: N/A    Number of Children: N/A  . 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 Smoking status: Never Smoker   . Smokeless tobacco: Not on file  . Alcohol Use: No  . Drug Use: No  . Sexually Active: Not Currently   Other Topics Concern  .  Not on file   Social History Narrative   SingleRegular Exercise-yesOccupation: Tree surgeon for [employer]     Constitutional: Denies fever, malaise, fatigue, headache or abrupt weight changes.  Musculoskeletal: Pt reports hip pain. Denies decrease in range of motion, difficulty with gait, muscle pain or joint pain and swelling.  Skin: Denies redness, rashes, lesions or ulcercations.  Neurological: Denies dizziness, difficulty with memory, difficulty with speech or problems with balance and coordination.   No other specific complaints in a complete review of systems (except as listed in HPI above).     Objective:   Physical Exam  BP 122/82  Pulse 92  Temp 97.9 F (36.6 C) (Oral)  Ht 6\' 2"  (1.88 m)  Wt 217 lb 12 oz (98.771 kg)  BMI 27.96 kg/m2  SpO2 96% Wt Readings from Last 3 Encounters:  02/16/12 217 lb 12 oz (98.771 kg)  03/13/11 206 lb (93.441 kg)  11/04/10 216 lb  3.2 oz (98.068 kg)    General: Appears his stated age, well developed, well nourished in NAD. Cardiovascular: Normal rate and rhythm. S1,S2 noted.  No murmur, rubs or gallops noted. No JVD or BLE edema. No carotid bruits noted. Pulmonary/Chest: Normal effort and positive vesicular breath sounds. No respiratory distress. No wheezes, rales or ronchi noted.  Musculoskeletal: Pinpoint tenderness on the trochanter. Painful range of motion. No signs of joint swelling. Stiff LS Neurological: Alert and oriented. Cranial nerves II-XII intact. Coordination normal. +DTRs bilaterally.        Assessment & Plan:   Hip pain, due to trochanteric bursitis, refractory Options discussed He asked me to inject the hip Voltaren gel

## 2012-03-25 ENCOUNTER — Encounter: Payer: Self-pay | Admitting: Internal Medicine

## 2012-03-25 ENCOUNTER — Ambulatory Visit (INDEPENDENT_AMBULATORY_CARE_PROVIDER_SITE_OTHER): Payer: BC Managed Care – PPO | Admitting: Internal Medicine

## 2012-03-25 ENCOUNTER — Telehealth: Payer: Self-pay | Admitting: *Deleted

## 2012-03-25 VITALS — BP 120/76 | HR 93 | Temp 97.6°F | Ht 74.0 in | Wt 220.0 lb

## 2012-03-25 DIAGNOSIS — N529 Male erectile dysfunction, unspecified: Secondary | ICD-10-CM

## 2012-03-25 MED ORDER — TADALAFIL 2.5 MG PO TABS: 1.0000 | ORAL_TABLET | Freq: Every morning | ORAL | Status: DC

## 2012-03-25 NOTE — Patient Instructions (Signed)
Erectile Dysfunction  Erectile dysfunction (ED) is the inability to get a good enough erection to have sexual intercourse. ED may involve:  · Inability to get an erection.  · Lack of enough hardness to allow penetration.  · Loss of the erection before sex is finished.  · Premature ejaculation.  · Any combination of these problems if they occur more than 25% of the time.  CAUSES  · Certain drugs, such as:  · Pain relievers.  · Antihistamines.  · Antidepressants.  · Blood pressure medicines.  · Water pills.  · Ulcer medicines.  · Muscle relaxants.  · Illegal drugs.  · Excessive drinking.  · Psychological causes, such as:  · Anxiety.  · Depression.  · Sadness.  · Exhaustion.  · Performance fear.  · Stress.  · Physical causes, such as:  · Artery problems. This may include diabetes, smoking, liver disease, or atherosclerosis.  · High blood pressure.  · Hormonal problems, such as low testosterone.  · Obesity.  · Nerve problems. This may include back or pelvic injuries, diabetes, multiple sclerosis, Parkinson's disease, or some surgeries.  SYMPTOMS  · Inability to get an erection.  · Lack of enough hardness to allow penetration.  · Loss of the erection before sex is finished.  · Premature ejaculation.  · Normal erections at some times, but with frequent unsatisfactory episodes.  · Orgasms that are not satisfactory in sensation or frequency.  · Low sexual satisfaction in either partner because of erection problems.  · A curved penis occurring with erection. The curve may cause pain or may be too curved to allow for intercourse.  · Never having nighttime erections.  DIAGNOSIS  Your caregiver can often diagnose this condition by:  · Performing a physical exam to find other diseases or specific problems with the penis.  · Asking you detailed questions about the problem.  · Performing blood tests to check for diabetes or to measure hormone levels.  · Performing urine tests to find other underlying health  conditions.  · Performing an ultrasound to check for scarring.  · Performing a test to check blood flow to the penis.  · Doing a sleep study at home to measure nighttime erections.  TREATMENT   · You may be prescribed medicines by mouth.  · You may be given medicine injections into the penis.  · You may be prescribed a vacuum pump with a ring.  · Penile implant surgery may be performed. You may receive:  · An inflatable implant.  · A semi-rigid implant.  · Blood vessel surgery may be performed.  HOME CARE INSTRUCTIONS  · Take all medicine as directed by your caregiver. Do not take any other medicines without talking to your caregiver first.  · Follow your caregiver's directions for specific treatments as prescribed.  · Follow up with your caregiver as directed.  Document Released: 02/08/2000 Document Revised: 05/05/2011 Document Reviewed: 06/02/2010  [employer]® Patient Information ©2013 [employer].

## 2012-03-25 NOTE — Telephone Encounter (Signed)
Pt is requesting a callback from MD regarding personal medical issue. He states that this is his 3rd call and he has not received a callback yet.

## 2012-03-25 NOTE — Progress Notes (Signed)
Subjective:    Patient ID: Corey [name], male    DOB: 10/15/1950, 62 y.o.   MRN: [id number]  HPI  Pt presents to the clinic today with c/o of erectile dysfunction. This has been going for about 2 months. He is able to obtain an erection but is having trouble maintaining it. He has never had problems like this in the past. He does not smoke. He is not stressed.  Review of Systems      Past Medical History  Diagnosis Date  . Allergic rhinitis   . Asthma   . GERD (gastroesophageal reflux disease)   . Barrett's esophagus   . Hyperlipidemia   . HTN (hypertension)   . Nephrolithiasis     Current Outpatient Prescriptions  Medication Sig Dispense Refill  . cetirizine (ZYRTEC) 10 MG tablet Take 10 mg by mouth 2 (two) times daily.       . Cholecalciferol (VITAMIN D3) 1000 UNITS tablet Take 1,000 Units by mouth daily.        . diclofenac sodium (VOLTAREN) 1 % GEL Apply 4 g topically 4 (four) times daily.  100 g  3  . pantoprazole (PROTONIX) 40 MG tablet Take 40 mg by mouth daily.      . Pramoxine-HC (PRAMOSONE) 1-2.5 % OINT Use bid  28.4 g  4  . promethazine (PHENERGAN) 12.5 MG tablet Take 2 tablets (25 mg total) by mouth every 8 (eight) hours as needed for nausea.  20 tablet  0  . amLODipine-olmesartan (AZOR) 5-40 MG per tablet Take 0.5 tablets by mouth daily.      . Tadalafil 2.5 MG TABS Take 1 tablet (2.5 mg total) by mouth every morning.  30 each  0   Current Facility-Administered Medications  Medication Dose Route Frequency Provider Last Rate Last Dose  . methylPREDNISolone acetate (DEPO-MEDROL) injection 80 mg  80 mg Intra-articular Once Tresa Garter, MD        Allergies  Allergen Reactions  . Albuterol     REACTION: "feels like drowning"  . Ciprofloxacin     REACTION: rash  . Clarithromycin     REACTION: swelling  . Colesevelam     REACTION: constip  . Enalapril Maleate     REACTION: Erectile dysfunction  . Esomeprazole Magnesium     REACTION: epigastric  pain  . Niacin     REACTION: diarrhea  . Penicillins     REACTION: rash  . Pravastatin Sodium     REACTION: aching, listless  . Rosuvastatin     REACTION: achy    Family History  Problem Relation Age of Onset  . Hypertension    . Colon polyps Mother   . Heart disease Mother     History   Social History  . Marital Status: Single    Spouse Name: N/A    Number of Children: N/A  . 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 Smoking status: Never Smoker   . Smokeless tobacco: Not on file  . Alcohol Use: No  . Drug Use: No  . Sexually Active: Not Currently   Other Topics Concern  . Not on file   Social History Narrative   SingleRegular Exercise-yesOccupation: Tree surgeon for [employer]     Constitutional: Denies fever, malaise, fatigue, headache or abrupt weight changes.   Cardiovascular: Denies chest pain, chest tightness, palpitations or swelling in the hands or feet.  GU: Denies urgency, frequency, pain with urination, burning sensation, blood in urine, odor or discharge.  No other specific complaints in a complete review of systems (except as listed in HPI above).  Objective:   Physical Exam   BP 120/76  Pulse 93  Temp 97.6 F (36.4 C) (Oral)  Ht 6\' 2"  (1.88 m)  Wt 220 lb (99.791 kg)  BMI 28.25 kg/m2  SpO2 97% Wt Readings from Last 3 Encounters:  03/25/12 220 lb (99.791 kg)  03/22/12 221 lb (100.245 kg)  02/16/12 217 lb 12 oz (98.771 kg)    General: Appears his stated age, well developed, well nourished in NAD. Cardiovascular: Normal rate and rhythm. S1,S2 noted.  No murmur, rubs or gallops noted. No JVD or BLE edema. No carotid bruits noted. Pulmonary/Chest: Normal effort and positive vesicular breath sounds. No respiratory distress. No wheezes, rales or ronchi noted.         Assessment & Plan:   Erectile dysfunction, new onset with additional  workup required:  eRx for Cialis (voucher given) Let me know after 1 month if we need to increase the dose  RTC as needed or if symptoms persist

## 2012-04-02 ENCOUNTER — Telehealth: Payer: Self-pay | Admitting: *Deleted

## 2012-04-02 NOTE — Telephone Encounter (Signed)
Completed PA form for Voltaren faxed to ins.

## 2012-04-06 ENCOUNTER — Other Ambulatory Visit: Payer: Self-pay | Admitting: *Deleted

## 2012-04-06 ENCOUNTER — Telehealth: Payer: Self-pay | Admitting: *Deleted

## 2012-04-06 ENCOUNTER — Other Ambulatory Visit (INDEPENDENT_AMBULATORY_CARE_PROVIDER_SITE_OTHER): Payer: BC Managed Care – PPO

## 2012-04-06 DIAGNOSIS — Z0000 Encounter for general adult medical examination without abnormal findings: Secondary | ICD-10-CM

## 2012-04-06 LAB — LIPID PANEL
Cholesterol: 234 mg/dL — ABNORMAL HIGH (ref 0–200)
Total CHOL/HDL Ratio: 5

## 2012-04-06 LAB — CBC WITH DIFFERENTIAL/PLATELET
Basophils Relative: 0.5 % (ref 0.0–3.0)
Eosinophils Relative: 1.6 % (ref 0.0–5.0)
HCT: 48.8 % (ref 39.0–52.0)
Lymphs Abs: 1.1 10*3/uL (ref 0.7–4.0)
MCV: 85.6 fl (ref 78.0–100.0)
Monocytes Absolute: 0.5 10*3/uL (ref 0.1–1.0)
Platelets: 200 10*3/uL (ref 150.0–400.0)
WBC: 5.7 10*3/uL (ref 4.5–10.5)

## 2012-04-06 LAB — LDL CHOLESTEROL, DIRECT: Direct LDL: 157.4 mg/dL

## 2012-04-06 LAB — URINALYSIS, ROUTINE W REFLEX MICROSCOPIC
Hgb urine dipstick: NEGATIVE
Ketones, ur: NEGATIVE
Urine Glucose: NEGATIVE
Urobilinogen, UA: 0.2 (ref 0.0–1.0)

## 2012-04-06 LAB — HEPATIC FUNCTION PANEL
ALT: 40 U/L (ref 0–53)
Alkaline Phosphatase: 82 U/L (ref 39–117)
Bilirubin, Direct: 0.1 mg/dL (ref 0.0–0.3)
Total Protein: 8 g/dL (ref 6.0–8.3)

## 2012-04-06 LAB — BASIC METABOLIC PANEL
CO2: 29 mEq/L (ref 19–32)
Chloride: 100 mEq/L (ref 96–112)
Sodium: 135 mEq/L (ref 135–145)

## 2012-04-06 NOTE — Telephone Encounter (Signed)
Voltaren PA approved from 04/02/12-12/27/14. Pharmacy informed.

## 2012-04-12 ENCOUNTER — Ambulatory Visit (INDEPENDENT_AMBULATORY_CARE_PROVIDER_SITE_OTHER): Payer: BC Managed Care – PPO | Admitting: Internal Medicine

## 2012-04-12 ENCOUNTER — Encounter: Payer: Self-pay | Admitting: Internal Medicine

## 2012-04-12 VITALS — BP 148/70 | HR 76 | Temp 98.9°F | Resp 16 | Ht 74.0 in | Wt 221.0 lb

## 2012-04-12 DIAGNOSIS — N400 Enlarged prostate without lower urinary tract symptoms: Secondary | ICD-10-CM | POA: Insufficient documentation

## 2012-04-12 DIAGNOSIS — Z0000 Encounter for general adult medical examination without abnormal findings: Secondary | ICD-10-CM

## 2012-04-12 DIAGNOSIS — E785 Hyperlipidemia, unspecified: Secondary | ICD-10-CM

## 2012-04-12 DIAGNOSIS — N529 Male erectile dysfunction, unspecified: Secondary | ICD-10-CM

## 2012-04-12 DIAGNOSIS — L508 Other urticaria: Secondary | ICD-10-CM

## 2012-04-12 MED ORDER — PANTOPRAZOLE SODIUM 40 MG PO TBEC: 40.0000 mg | DELAYED_RELEASE_TABLET | Freq: Every day | ORAL | Status: DC

## 2012-04-12 MED ORDER — AMLODIPINE-OLMESARTAN 5-40 MG PO TABS: 0.5000 | ORAL_TABLET | Freq: Every day | ORAL | Status: DC

## 2012-04-12 MED ORDER — PRAMOXINE-HC 1-2.5 % EX OINT: TOPICAL_OINTMENT | CUTANEOUS | Status: DC

## 2012-04-12 MED ORDER — TADALAFIL 2.5 MG PO TABS: 1.0000 | ORAL_TABLET | Freq: Every morning | ORAL | Status: DC

## 2012-04-12 NOTE — Progress Notes (Signed)
  Subjective:    HPI   The patient is here for a wellness exam. The patient has been doing well overall without major physical or psychological issues going on lately. Exrcising 3/wk  Wt Readings from Last 3 Encounters:  04/12/12 221 lb (100.245 kg)  03/25/12 220 lb (99.791 kg)  03/22/12 221 lb (100.245 kg)   BP Readings from Last 3 Encounters:  04/12/12 148/70  03/25/12 120/76  03/22/12 160/84      Review of Systems  Constitutional: Negative for appetite change, fatigue and unexpected weight change.  HENT: Negative for nosebleeds, congestion, sore throat, sneezing, trouble swallowing and neck pain.   Eyes: Negative for itching and visual disturbance.  Respiratory: Negative for cough.   Cardiovascular: Negative for chest pain, palpitations and leg swelling.  Gastrointestinal: Negative for nausea, diarrhea, blood in stool and abdominal distention.  Genitourinary: Negative for frequency and hematuria.  Musculoskeletal: Negative for back pain, joint swelling and gait problem.  Skin: Negative for rash.  Neurological: Negative for dizziness, tremors, speech difficulty and weakness.  Psychiatric/Behavioral: Negative for sleep disturbance, dysphoric mood and agitation. The patient is not nervous/anxious.        Objective:   Physical Exam  Constitutional: He is oriented to person, place, and time. He appears well-developed.  HENT:  Mouth/Throat: Oropharynx is clear and moist.  Eyes: Conjunctivae are normal. Pupils are equal, round, and reactive to light.  Neck: Normal range of motion. No JVD present. No thyromegaly present.  Cardiovascular: Normal rate, regular rhythm, normal heart sounds and intact distal pulses.  Exam reveals no gallop and no friction rub.   No murmur heard. Pulmonary/Chest: Effort normal and breath sounds normal. No respiratory distress. He has no wheezes. He has no rales. He exhibits no tenderness.  Abdominal: Soft. Bowel sounds are normal. He exhibits no  distension and no mass. There is no tenderness. There is no rebound and no guarding.  Musculoskeletal: Normal range of motion. He exhibits no edema and no tenderness.  Lymphadenopathy:    He has no cervical adenopathy.  Neurological: He is alert and oriented to person, place, and time. He has normal reflexes. No cranial nerve deficit. He exhibits normal muscle tone. Coordination normal.  Skin: Skin is warm and dry. No rash noted.  Psychiatric: He has a normal mood and affect. His behavior is normal. Judgment and thought content normal.   Lab Results  Component Value Date   WBC 5.7 04/06/2012   HGB 16.7 04/06/2012   HCT 48.8 04/06/2012   PLT 200.0 04/06/2012   GLUCOSE 88 04/06/2012   CHOL 234* 04/06/2012   TRIG 169.0* 04/06/2012   HDL 42.60 04/06/2012   LDLDIRECT 157.4 04/06/2012   LDLCALC 113* 11/14/2008   ALT 40 04/06/2012   AST 37 04/06/2012   NA 135 04/06/2012   K 4.2 04/06/2012   CL 100 04/06/2012   CREATININE 1.1 04/06/2012   BUN 15 04/06/2012   CO2 29 04/06/2012   TSH 1.36 04/06/2012   PSA 0.80 03/06/2011   HGBA1C 5.9 05/16/2009          Assessment & Plan:

## 2012-04-12 NOTE — Assessment & Plan Note (Signed)
Continue with current prescription therapy as reflected on the Med list.  

## 2012-04-12 NOTE — Assessment & Plan Note (Signed)
Zyrtec  

## 2012-04-12 NOTE — Assessment & Plan Note (Signed)
Well on Cialis

## 2012-04-12 NOTE — Assessment & Plan Note (Signed)
We discussed age appropriate health related issues, including available/recomended screening tests and vaccinations. We discussed a need for adhering to healthy diet and exercise. Labs/EKG were reviewed/ordered. All questions were answered.   

## 2012-04-23 ENCOUNTER — Telehealth: Payer: Self-pay | Admitting: Internal Medicine

## 2012-04-23 NOTE — Telephone Encounter (Signed)
The pt called hoping to get a written rx for Tadalafil 2.5.  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  He is hoping to come by at 11:30am to pick this up (on his lunch break) His callback is [phone]

## 2012-04-23 NOTE — Telephone Encounter (Signed)
I called pt- he states he is good now. The pharmacy has his rx ready.

## 2012-05-17 ENCOUNTER — Telehealth: Payer: Self-pay | Admitting: Internal Medicine

## 2012-05-17 NOTE — Telephone Encounter (Signed)
Ok to send in Rx for 5 mg?

## 2012-05-17 NOTE — Telephone Encounter (Signed)
Patient left message on triage requesting that we update his Cialis script from 2.5 mg to 5mg , would like this called in to [facility]

## 2012-05-18 NOTE — Telephone Encounter (Signed)
Ok Thx 

## 2012-05-19 MED ORDER — TADALAFIL 5 MG PO TABS: 5.0000 mg | ORAL_TABLET | Freq: Every day | ORAL | Status: DC | PRN

## 2012-05-19 NOTE — Telephone Encounter (Signed)
Done

## 2012-07-16 ENCOUNTER — Other Ambulatory Visit: Payer: Self-pay | Admitting: *Deleted

## 2012-07-16 MED ORDER — PANTOPRAZOLE SODIUM 40 MG PO TBEC: 40.0000 mg | DELAYED_RELEASE_TABLET | Freq: Every day | ORAL | Status: DC

## 2012-07-16 MED ORDER — AMLODIPINE-OLMESARTAN 5-40 MG PO TABS: 0.5000 | ORAL_TABLET | Freq: Every day | ORAL | Status: DC

## 2012-08-18 ENCOUNTER — Ambulatory Visit (INDEPENDENT_AMBULATORY_CARE_PROVIDER_SITE_OTHER)
Admission: RE | Admit: 2012-08-18 | Discharge: 2012-08-18 | Disposition: A | Discharge status: Home / Self Care | Payer: Managed Care, Other (non HMO) | Source: Ambulatory Visit | Attending: Internal Medicine | Admitting: Internal Medicine

## 2012-08-18 ENCOUNTER — Encounter: Payer: Self-pay | Admitting: Internal Medicine

## 2012-08-18 ENCOUNTER — Ambulatory Visit (INDEPENDENT_AMBULATORY_CARE_PROVIDER_SITE_OTHER): Payer: Managed Care, Other (non HMO) | Admitting: Internal Medicine

## 2012-08-18 VITALS — BP 148/82 | HR 94 | Temp 98.1°F | Ht 74.0 in | Wt 224.0 lb

## 2012-08-18 DIAGNOSIS — M7062 Trochanteric bursitis, left hip: Secondary | ICD-10-CM

## 2012-08-18 DIAGNOSIS — M25559 Pain in unspecified hip: Secondary | ICD-10-CM

## 2012-08-18 DIAGNOSIS — M76899 Other specified enthesopathies of unspecified lower limb, excluding foot: Secondary | ICD-10-CM

## 2012-08-18 DIAGNOSIS — M25552 Pain in left hip: Secondary | ICD-10-CM

## 2012-08-18 NOTE — Progress Notes (Signed)
Subjective:    Patient ID: Corey [name], male    DOB: Mar 09, 1950, 62 y.o.   MRN: [id number]  HPI  Pt presents to the clinic today with c/o left hip pain. This is a chronic issue for hip. He had had one hip injection. It lasted for about 6 weeks. His pain returned after that wore off. He does not take anything OTC for the pain. It does seem to be worse when he lays on it. He is concerned because he is going to [place] in 2 weeks and will be doing a lot of sightseeing. He does not want to be in pain while on vacation. Also, Dr. Posey Rea recommended that he have his hip xray if he continue to have pain in that hip.  Review of Systems      Past Medical History  Diagnosis Date  . Allergic rhinitis   . Asthma   . GERD (gastroesophageal reflux disease)   . Barrett's esophagus   . Hyperlipidemia   . HTN (hypertension)   . Nephrolithiasis     Current Outpatient Prescriptions  Medication Sig Dispense Refill  . amLODipine-olmesartan (AZOR) 5-40 MG per tablet Take 0.5 tablets by mouth daily.  45 tablet  3  . cetirizine (ZYRTEC) 10 MG tablet Take 10 mg by mouth 2 (two) times daily.       . Cholecalciferol (VITAMIN D3) 1000 UNITS tablet Take 1,000 Units by mouth daily.        . diclofenac sodium (VOLTAREN) 1 % GEL Apply 4 g topically 4 (four) times daily.  100 g  3  . pantoprazole (PROTONIX) 40 MG tablet Take 1 tablet (40 mg total) by mouth daily.  90 tablet  3  . Pramoxine-HC (PRAMOSONE) 1-2.5 % OINT Use bid  28.4 g  4  . promethazine (PHENERGAN) 12.5 MG tablet Take 2 tablets (25 mg total) by mouth every 8 (eight) hours as needed for nausea.  20 tablet  0  . tadalafil (CIALIS) 5 MG tablet Take 1 tablet (5 mg total) by mouth daily as needed for erectile dysfunction.  30 tablet  5   Current Facility-Administered Medications  Medication Dose Route Frequency Provider Last Rate Last Dose  . methylPREDNISolone acetate (DEPO-MEDROL) injection 80 mg  80 mg Intra-articular Once Tresa Garter, MD        Allergies  Allergen Reactions  . Albuterol     REACTION: "feels like drowning"  . Ciprofloxacin     REACTION: rash  . Clarithromycin     REACTION: swelling  . Colesevelam     REACTION: constip  . Enalapril Maleate     REACTION: Erectile dysfunction  . Esomeprazole Magnesium     REACTION: epigastric pain  . Niacin     REACTION: diarrhea  . Penicillins     REACTION: rash  . Pravastatin Sodium     REACTION: aching, listless  . Rosuvastatin     REACTION: achy    Family History  Problem Relation Age of Onset  . Hypertension    . Colon polyps Mother   . Heart disease Mother     History   Social History  . Marital Status: Single    Spouse Name: N/A    Number of Children: N/A  . 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 Smoking status: Never Smoker   . Smokeless tobacco: Not on file  . Alcohol Use: No  .  Drug Use: No  . Sexually Active: Not Currently   Other Topics Concern  . Not on file   Social History Narrative   Single   Regular Exercise-yes   Occupation: Tree surgeon for [employer]              Constitutional: Denies fever, malaise, fatigue, headache or abrupt weight changes.  Musculoskeletal: Pt reports left hip pain. Denies decrease in range of motion, difficulty with gait, muscle pain or joint pain and swelling.  Skin: Denies redness, rashes, lesions or ulcercations.    No other specific complaints in a complete review of systems (except as listed in HPI above).  Objective:   Physical Exam  BP 148/82  Pulse 94  Temp(Src) 98.1 F (36.7 C) (Oral)  Ht 6\' 2"  (1.88 m)  Wt 224 lb (101.606 kg)  BMI 28.75 kg/m2  SpO2 96% Wt Readings from Last 3 Encounters:  08/18/12 224 lb (101.606 kg)  04/12/12 221 lb (100.245 kg)  03/25/12 220 lb (99.791 kg)    General: Appears his stated age, well developed, well nourished in  NAD. Cardiovascular: Normal rate and rhythm. S1,S2 noted.  No murmur, rubs or gallops noted. No JVD or BLE edema. No carotid bruits noted. Pulmonary/Chest: Normal effort and positive vesicular breath sounds. No respiratory distress. No wheezes, rales or ronchi noted.  Musculoskeletal: Pinpoint pain at the left hip trochanteric bursa. Normal range of motion. No signs of joint swelling. No difficulty with gait.    BMET    Component Value Date/Time   NA 135 04/06/2012 0843   K 4.2 04/06/2012 0843   CL 100 04/06/2012 0843   CO2 29 04/06/2012 0843   GLUCOSE 88 04/06/2012 0843   BUN 15 04/06/2012 0843   CREATININE 1.1 04/06/2012 0843   CALCIUM 9.2 04/06/2012 0843   GFRNONAA 74.12 12/28/2009 1124   GFRAA 74 10/20/2007 0736    Lipid Panel     Component Value Date/Time   CHOL 234* 04/06/2012 0843   TRIG 169.0* 04/06/2012 0843   HDL 42.60 04/06/2012 0843   CHOLHDL 5 04/06/2012 0843   VLDL 33.8 04/06/2012 0843   LDLCALC 113* 11/14/2008 0729    CBC    Component Value Date/Time   WBC 5.7 04/06/2012 0843   RBC 5.70 04/06/2012 0843   HGB 16.7 04/06/2012 0843   HCT 48.8 04/06/2012 0843   PLT 200.0 04/06/2012 0843   MCV 85.6 04/06/2012 0843   MCHC 34.2 04/06/2012 0843   RDW 15.5* 04/06/2012 0843   LYMPHSABS 1.1 04/06/2012 0843   MONOABS 0.5 04/06/2012 0843   EOSABS 0.1 04/06/2012 0843   BASOSABS 0.0 04/06/2012 0843    Hgb A1C Lab Results  Component Value Date   HGBA1C 5.9 05/16/2009         Assessment & Plan:   Left hip pain secondary to trochanteric bursitis:  Informed consent obtained, including benefits versus risks. Pt understands and verbally agrees. Pt skin cleansed with betadine and alcohol in the usual fashion. Point of maximum pain palpated. Bursa injected with 4ml Lidocaine and 80 mg Depo. Pt tolerated procedure well. Area covered with a band aid. After care instructions provided  Xray of left hip to r/o DJD versus avascular necrosis  RTC as needed

## 2012-08-18 NOTE — Patient Instructions (Signed)

## 2012-12-13 ENCOUNTER — Other Ambulatory Visit: Payer: Self-pay | Admitting: Internal Medicine

## 2013-04-13 ENCOUNTER — Encounter: Payer: Self-pay | Admitting: Internal Medicine

## 2013-04-13 ENCOUNTER — Ambulatory Visit (INDEPENDENT_AMBULATORY_CARE_PROVIDER_SITE_OTHER): Payer: Managed Care, Other (non HMO) | Admitting: Internal Medicine

## 2013-04-13 VITALS — BP 154/70 | HR 80 | Temp 98.8°F | Resp 16 | Wt 222.0 lb

## 2013-04-13 DIAGNOSIS — I10 Essential (primary) hypertension: Secondary | ICD-10-CM

## 2013-04-13 DIAGNOSIS — R5381 Other malaise: Secondary | ICD-10-CM

## 2013-04-13 DIAGNOSIS — R5383 Other fatigue: Secondary | ICD-10-CM

## 2013-04-13 DIAGNOSIS — Z0000 Encounter for general adult medical examination without abnormal findings: Secondary | ICD-10-CM

## 2013-04-13 DIAGNOSIS — K2270 Barrett's esophagus without dysplasia: Secondary | ICD-10-CM

## 2013-04-13 MED ORDER — PRAMOXINE-HC 1-2.5 % EX OINT: TOPICAL_OINTMENT | CUTANEOUS | Status: DC

## 2013-04-13 MED ORDER — AMLODIPINE-OLMESARTAN 5-40 MG PO TABS: 0.5000 | ORAL_TABLET | Freq: Every day | ORAL | Status: DC

## 2013-04-13 MED ORDER — PANTOPRAZOLE SODIUM 40 MG PO TBEC: 40.0000 mg | DELAYED_RELEASE_TABLET | Freq: Every day | ORAL | Status: DC

## 2013-04-13 MED ORDER — TADALAFIL 5 MG PO TABS: ORAL_TABLET | ORAL | Status: DC

## 2013-04-13 NOTE — Progress Notes (Signed)
Pre visit review using our clinic review tool, if applicable. No additional management support is needed unless otherwise documented below in the visit note. 

## 2013-04-13 NOTE — Progress Notes (Signed)
  Subjective:    HPI  The patient is here for a wellness exam. The patient has been doing well overall without major physical or psychological issues going on lately. Exrcising 3/wk  Wt Readings from Last 3 Encounters:  04/13/13 222 lb (100.699 kg)  08/18/12 224 lb (101.606 kg)  04/12/12 221 lb (100.245 kg)   BP Readings from Last 3 Encounters:  04/13/13 154/70  08/18/12 148/82  04/12/12 148/70      Review of Systems  Constitutional: Negative for appetite change, fatigue and unexpected weight change.  HENT: Negative for congestion, nosebleeds, sneezing, sore throat and trouble swallowing.   Eyes: Negative for itching and visual disturbance.  Respiratory: Negative for cough.   Cardiovascular: Negative for chest pain, palpitations and leg swelling.  Gastrointestinal: Negative for nausea, diarrhea, blood in stool and abdominal distention.  Genitourinary: Negative for frequency and hematuria.  Musculoskeletal: Negative for back pain, gait problem, joint swelling and neck pain.  Skin: Negative for rash.  Neurological: Negative for dizziness, tremors, speech difficulty and weakness.  Psychiatric/Behavioral: Negative for sleep disturbance, dysphoric mood and agitation. The patient is not nervous/anxious.        Objective:   Physical Exam  Constitutional: He is oriented to person, place, and time. He appears well-developed.  HENT:  Mouth/Throat: Oropharynx is clear and moist.  Eyes: Conjunctivae are normal. Pupils are equal, round, and reactive to light.  Neck: Normal range of motion. No JVD present. No thyromegaly present.  Cardiovascular: Normal rate, regular rhythm, normal heart sounds and intact distal pulses.  Exam reveals no gallop and no friction rub.   No murmur heard. Pulmonary/Chest: Effort normal and breath sounds normal. No respiratory distress. He has no wheezes. He has no rales. He exhibits no tenderness.  Abdominal: Soft. Bowel sounds are normal. He exhibits no  distension and no mass. There is no tenderness. There is no rebound and no guarding.  Musculoskeletal: Normal range of motion. He exhibits no edema and no tenderness.  Lymphadenopathy:    He has no cervical adenopathy.  Neurological: He is alert and oriented to person, place, and time. He has normal reflexes. No cranial nerve deficit. He exhibits normal muscle tone. Coordination normal.  Skin: Skin is warm and dry. No rash noted.  Psychiatric: He has a normal mood and affect. His behavior is normal. Judgment and thought content normal.   Lab Results  Component Value Date   WBC 5.7 04/06/2012   HGB 16.7 04/06/2012   HCT 48.8 04/06/2012   PLT 200.0 04/06/2012   GLUCOSE 88 04/06/2012   CHOL 234* 04/06/2012   TRIG 169.0* 04/06/2012   HDL 42.60 04/06/2012   LDLDIRECT 157.4 04/06/2012   LDLCALC 113* 11/14/2008   ALT 40 04/06/2012   AST 37 04/06/2012   NA 135 04/06/2012   K 4.2 04/06/2012   CL 100 04/06/2012   CREATININE 1.1 04/06/2012   BUN 15 04/06/2012   CO2 29 04/06/2012   TSH 1.36 04/06/2012   PSA 0.80 03/06/2011   HGBA1C 5.9 05/16/2009          Assessment & Plan:

## 2013-04-13 NOTE — Patient Instructions (Signed)

## 2013-04-14 ENCOUNTER — Telehealth: Payer: Self-pay | Admitting: *Deleted

## 2013-04-14 NOTE — Telephone Encounter (Signed)
Sent completed Cialis 5 mg PA to pt's insurance plan.

## 2013-04-15 ENCOUNTER — Other Ambulatory Visit (INDEPENDENT_AMBULATORY_CARE_PROVIDER_SITE_OTHER): Payer: Managed Care, Other (non HMO)

## 2013-04-15 DIAGNOSIS — R5381 Other malaise: Secondary | ICD-10-CM

## 2013-04-15 DIAGNOSIS — Z0000 Encounter for general adult medical examination without abnormal findings: Secondary | ICD-10-CM

## 2013-04-15 DIAGNOSIS — R5383 Other fatigue: Secondary | ICD-10-CM

## 2013-04-15 LAB — LIPID PANEL
CHOLESTEROL: 254 mg/dL — AB (ref 0–200)
Cholesterol: 259 mg/dL — ABNORMAL HIGH (ref 0–200)
HDL: 44.5 mg/dL (ref >39.00)
HDL: 44.5 mg/dL (ref >39.00)
Total CHOL/HDL Ratio: 6
Total CHOL/HDL Ratio: 6
Triglycerides: 189 mg/dL — ABNORMAL HIGH (ref 0.0–149.0)
Triglycerides: 184 mg/dL — ABNORMAL HIGH (ref 0.0–149.0)
VLDL: 37.8 mg/dL (ref 0.0–40.0)
VLDL: 36.8 mg/dL (ref 0.0–40.0)

## 2013-04-15 LAB — CBC WITH DIFFERENTIAL/PLATELET
BASOS PCT: 0.4 % (ref 0.0–3.0)
Basophils Absolute: 0 10*3/uL (ref 0.0–0.1)
EOS PCT: 1.1 % (ref 0.0–5.0)
Eosinophils Absolute: 0.1 10*3/uL (ref 0.0–0.7)
HEMATOCRIT: 45.7 % (ref 39.0–52.0)
Hemoglobin: 15.2 g/dL (ref 13.0–17.0)
LYMPHS ABS: 1 10*3/uL (ref 0.7–4.0)
Lymphocytes Relative: 20.7 % (ref 12.0–46.0)
MCHC: 33.3 g/dL (ref 30.0–36.0)
MCV: 88.7 fl (ref 78.0–100.0)
MONO ABS: 0.4 10*3/uL (ref 0.1–1.0)
MONOS PCT: 7.5 % (ref 3.0–12.0)
Neutro Abs: 3.5 10*3/uL (ref 1.4–7.7)
Neutrophils Relative %: 70.3 % (ref 43.0–77.0)
Platelets: 214 10*3/uL (ref 150.0–400.0)
RBC: 5.15 Mil/uL (ref 4.22–5.81)
RDW: 14.1 % (ref 11.5–14.6)
WBC: 4.9 10*3/uL (ref 4.5–10.5)

## 2013-04-15 LAB — BASIC METABOLIC PANEL
BUN: 25 mg/dL — AB (ref 6–23)
CALCIUM: 9.5 mg/dL (ref 8.4–10.5)
CO2: 29 meq/L (ref 19–32)
Chloride: 103 mEq/L (ref 96–112)
Creatinine, Ser: 1 mg/dL (ref 0.4–1.5)
GFR: 78.22 mL/min (ref >60.00)
Glucose, Bld: 101 mg/dL — ABNORMAL HIGH (ref 70–99)
Potassium: 4.6 mEq/L (ref 3.5–5.1)
Sodium: 138 mEq/L (ref 135–145)

## 2013-04-15 LAB — URINALYSIS
Bilirubin Urine: NEGATIVE
HGB URINE DIPSTICK: NEGATIVE
Ketones, ur: NEGATIVE
Leukocytes, UA: NEGATIVE
NITRITE: NEGATIVE
Specific Gravity, Urine: 1.02 (ref 1.000–1.030)
TOTAL PROTEIN, URINE-UPE24: NEGATIVE
URINE GLUCOSE: NEGATIVE
Urobilinogen, UA: 0.2 (ref 0.0–1.0)
pH: 6 (ref 5.0–8.0)

## 2013-04-15 LAB — LDL CHOLESTEROL, DIRECT
Direct LDL: 191.5 mg/dL
Direct LDL: 193.6 mg/dL

## 2013-04-15 LAB — SEDIMENTATION RATE: Sed Rate: 15 mm/hr (ref 0–22)

## 2013-04-15 LAB — PSA: PSA: 0.85 ng/mL (ref 0.10–4.00)

## 2013-04-15 LAB — TSH: TSH: 1.27 u[IU]/mL (ref 0.35–5.50)

## 2013-04-18 LAB — TESTOSTERONE, FREE, TOTAL, SHBG
Sex Hormone Binding: 34 nmol/L (ref 13–71)
TESTOSTERONE-% FREE: 2.1 % (ref 1.6–2.9)
TESTOSTERONE: 490 ng/dL (ref 300–890)
Testosterone, Free: 100.7 pg/mL (ref 47.0–244.0)

## 2013-04-25 NOTE — Assessment & Plan Note (Signed)
Continue with current prescription therapy as reflected on the Med list.  

## 2013-04-25 NOTE — Assessment & Plan Note (Signed)
We discussed age appropriate health related issues, including available/recomended screening tests and vaccinations. We discussed a need for adhering to healthy diet and exercise. Labs/EKG were reviewed/ordered. All questions were answered.   

## 2013-05-13 ENCOUNTER — Other Ambulatory Visit: Payer: Self-pay | Admitting: *Deleted

## 2013-05-13 MED ORDER — TADALAFIL 5 MG PO TABS: ORAL_TABLET | ORAL | Status: DC

## 2013-06-30 ENCOUNTER — Other Ambulatory Visit: Payer: Self-pay | Admitting: Physician Assistant

## 2014-03-08 ENCOUNTER — Ambulatory Visit (INDEPENDENT_AMBULATORY_CARE_PROVIDER_SITE_OTHER): Payer: Managed Care, Other (non HMO) | Admitting: Internal Medicine

## 2014-03-08 ENCOUNTER — Encounter: Payer: Self-pay | Admitting: Internal Medicine

## 2014-03-08 VITALS — BP 122/70 | HR 84 | Temp 98.2°F | Wt 219.0 lb

## 2014-03-08 DIAGNOSIS — K2270 Barrett's esophagus without dysplasia: Secondary | ICD-10-CM

## 2014-03-08 DIAGNOSIS — I10 Essential (primary) hypertension: Secondary | ICD-10-CM

## 2014-03-08 MED ORDER — TADALAFIL 5 MG PO TABS: ORAL_TABLET | ORAL | Status: DC

## 2014-03-08 MED ORDER — PANTOPRAZOLE SODIUM 40 MG PO TBEC: 40.0000 mg | DELAYED_RELEASE_TABLET | Freq: Every day | ORAL | Status: DC

## 2014-03-08 MED ORDER — PRAMOXINE-HC 1-2.5 % EX OINT: TOPICAL_OINTMENT | CUTANEOUS | Status: DC

## 2014-03-08 MED ORDER — AMLODIPINE-OLMESARTAN 5-40 MG PO TABS: 0.5000 | ORAL_TABLET | Freq: Every day | ORAL | Status: DC

## 2014-03-08 NOTE — Progress Notes (Signed)
Pre visit review using our clinic review tool, if applicable. No additional management support is needed unless otherwise documented below in the visit note. 

## 2014-03-08 NOTE — Progress Notes (Signed)
  Subjective:    HPI  The patient is here for a f/u HTN, GERD  Wt Readings from Last 3 Encounters:  03/08/14 219 lb (99.338 kg)  04/13/13 222 lb (100.699 kg)  08/18/12 224 lb (101.606 kg)   BP Readings from Last 3 Encounters:  03/08/14 122/70  04/13/13 154/70  08/18/12 148/82      Review of Systems  Constitutional: Negative for appetite change, fatigue and unexpected weight change.  HENT: Negative for congestion, nosebleeds, sneezing, sore throat and trouble swallowing.   Eyes: Negative for itching and visual disturbance.  Respiratory: Negative for cough.   Cardiovascular: Negative for chest pain, palpitations and leg swelling.  Gastrointestinal: Negative for nausea, diarrhea, blood in stool and abdominal distention.  Genitourinary: Negative for frequency and hematuria.  Musculoskeletal: Negative for back pain, joint swelling, gait problem and neck pain.  Skin: Negative for rash.  Neurological: Negative for dizziness, tremors, speech difficulty and weakness.  Psychiatric/Behavioral: Negative for sleep disturbance, dysphoric mood and agitation. The patient is not nervous/anxious.        Objective:   Physical Exam  Constitutional: He is oriented to person, place, and time. He appears well-developed.  HENT:  Mouth/Throat: Oropharynx is clear and moist.  Eyes: Conjunctivae are normal. Pupils are equal, round, and reactive to light.  Neck: Normal range of motion. No JVD present. No thyromegaly present.  Cardiovascular: Normal rate, regular rhythm, normal heart sounds and intact distal pulses.  Exam reveals no gallop and no friction rub.   No murmur heard. Pulmonary/Chest: Effort normal and breath sounds normal. No respiratory distress. He has no wheezes. He has no rales. He exhibits no tenderness.  Abdominal: Soft. Bowel sounds are normal. He exhibits no distension and no mass. There is no tenderness. There is no rebound and no guarding.  Musculoskeletal: Normal range of  motion. He exhibits no edema or tenderness.  Lymphadenopathy:    He has no cervical adenopathy.  Neurological: He is alert and oriented to person, place, and time. He has normal reflexes. No cranial nerve deficit. He exhibits normal muscle tone. Coordination normal.  Skin: Skin is warm and dry. No rash noted.  Psychiatric: He has a normal mood and affect. His behavior is normal. Judgment and thought content normal.   Lab Results  Component Value Date   WBC 4.9 04/15/2013   HGB 15.2 04/15/2013   HCT 45.7 04/15/2013   PLT 214.0 04/15/2013   GLUCOSE 101* 04/15/2013   CHOL 259* 04/15/2013   CHOL 254* 04/15/2013   TRIG 189.0* 04/15/2013   TRIG 184.0* 04/15/2013   HDL 44.50 04/15/2013   HDL 44.50 04/15/2013   LDLDIRECT 191.5 04/15/2013   LDLDIRECT 193.6 04/15/2013   LDLCALC 113* 11/14/2008   ALT 40 04/06/2012   AST 37 04/06/2012   NA 138 04/15/2013   K 4.6 04/15/2013   CL 103 04/15/2013   CREATININE 1.0 04/15/2013   BUN 25* 04/15/2013   CO2 29 04/15/2013   TSH 1.27 04/15/2013   PSA 0.85 04/15/2013   HGBA1C 5.9 05/16/2009          Assessment & Plan:

## 2014-03-08 NOTE — Assessment & Plan Note (Signed)
Continue with current prescription therapy as reflected on the Med list.  

## 2014-04-21 ENCOUNTER — Encounter: Payer: Managed Care, Other (non HMO) | Admitting: Internal Medicine

## 2014-04-25 ENCOUNTER — Other Ambulatory Visit: Payer: Managed Care, Other (non HMO)

## 2014-04-28 ENCOUNTER — Encounter: Payer: Self-pay | Admitting: Internal Medicine

## 2014-04-28 ENCOUNTER — Ambulatory Visit (INDEPENDENT_AMBULATORY_CARE_PROVIDER_SITE_OTHER): Payer: Managed Care, Other (non HMO) | Admitting: Internal Medicine

## 2014-04-28 VITALS — BP 140/70 | HR 97 | Temp 98.6°F | Ht 74.0 in | Wt 221.0 lb

## 2014-04-28 DIAGNOSIS — Z0000 Encounter for general adult medical examination without abnormal findings: Secondary | ICD-10-CM

## 2014-04-28 DIAGNOSIS — K2270 Barrett's esophagus without dysplasia: Secondary | ICD-10-CM

## 2014-04-28 MED ORDER — PANTOPRAZOLE SODIUM 20 MG PO TBEC: 20.0000 mg | DELAYED_RELEASE_TABLET | Freq: Every day | ORAL | Status: DC

## 2014-04-28 NOTE — Progress Notes (Signed)
Pre visit review using our clinic review tool, if applicable. No additional management support is needed unless otherwise documented below in the visit note. 

## 2014-04-28 NOTE — Progress Notes (Signed)
  Subjective:    HPI  The patient is here for a wellness exam. The patient has been doing well overall without major physical or psychological issues going on lately. Exrcising 3/wk  Wt Readings from Last 3 Encounters:  04/28/14 221 lb (100.245 kg)  03/08/14 219 lb (99.338 kg)  04/13/13 222 lb (100.699 kg)   BP Readings from Last 3 Encounters:  04/28/14 140/70  03/08/14 122/70  04/13/13 154/70      Review of Systems  Constitutional: Negative for appetite change, fatigue and unexpected weight change.  HENT: Negative for congestion, nosebleeds, sneezing, sore throat and trouble swallowing.   Eyes: Negative for itching and visual disturbance.  Respiratory: Negative for cough.   Cardiovascular: Negative for chest pain, palpitations and leg swelling.  Gastrointestinal: Negative for nausea, vomiting, diarrhea, blood in stool and abdominal distention.  Genitourinary: Negative for dysuria, urgency, frequency, hematuria and decreased urine volume.  Musculoskeletal: Negative for back pain, joint swelling, gait problem and neck pain.  Skin: Negative for rash.  Neurological: Negative for dizziness, tremors, speech difficulty and weakness.  Psychiatric/Behavioral: Negative for suicidal ideas, behavioral problems, sleep disturbance, dysphoric mood and agitation. The patient is not nervous/anxious.        Objective:   Physical Exam  Constitutional: He is oriented to person, place, and time. He appears well-developed.  HENT:  Mouth/Throat: Oropharynx is clear and moist.  Eyes: Conjunctivae are normal. Pupils are equal, round, and reactive to light.  Neck: Normal range of motion. No JVD present. No thyromegaly present.  Cardiovascular: Normal rate, regular rhythm, normal heart sounds and intact distal pulses.  Exam reveals no gallop and no friction rub.   No murmur heard. Pulmonary/Chest: Effort normal and breath sounds normal. No respiratory distress. He has no wheezes. He has no rales.  He exhibits no tenderness.  Abdominal: Soft. Bowel sounds are normal. He exhibits no distension and no mass. There is no tenderness. There is no rebound and no guarding.  Genitourinary: Rectum normal. Guaiac negative stool.  Prostate 1+  Musculoskeletal: Normal range of motion. He exhibits no edema or tenderness.  Lymphadenopathy:    He has no cervical adenopathy.  Neurological: He is alert and oriented to person, place, and time. He has normal reflexes. No cranial nerve deficit. He exhibits normal muscle tone. Coordination normal.  Skin: Skin is warm and dry. No rash noted.  Psychiatric: He has a normal mood and affect. His behavior is normal. Judgment and thought content normal.   Lab Results  Component Value Date   WBC 4.9 04/15/2013   HGB 15.2 04/15/2013   HCT 45.7 04/15/2013   PLT 214.0 04/15/2013   GLUCOSE 101* 04/15/2013   CHOL 259* 04/15/2013   CHOL 254* 04/15/2013   TRIG 189.0* 04/15/2013   TRIG 184.0* 04/15/2013   HDL 44.50 04/15/2013   HDL 44.50 04/15/2013   LDLDIRECT 191.5 04/15/2013   LDLDIRECT 193.6 04/15/2013   LDLCALC 113* 11/14/2008   ALT 40 04/06/2012   AST 37 04/06/2012   NA 138 04/15/2013   K 4.6 04/15/2013   CL 103 04/15/2013   CREATININE 1.0 04/15/2013   BUN 25* 04/15/2013   CO2 29 04/15/2013   TSH 1.27 04/15/2013   PSA 0.85 04/15/2013   HGBA1C 5.9 05/16/2009          Assessment & Plan:

## 2014-04-28 NOTE — Assessment & Plan Note (Addendum)
Machael would like to get off Protonix due to risks of dementia I do not support this move Dreon should discuss it w/GI

## 2014-04-28 NOTE — Assessment & Plan Note (Signed)
We discussed age appropriate health related issues, including available/recomended screening tests and vaccinations. We discussed a need for adhering to healthy diet and exercise. Labs/EKG were reviewed/ordered. All questions were answered.   

## 2014-05-05 ENCOUNTER — Other Ambulatory Visit (INDEPENDENT_AMBULATORY_CARE_PROVIDER_SITE_OTHER): Payer: Managed Care, Other (non HMO)

## 2014-05-05 DIAGNOSIS — K2270 Barrett's esophagus without dysplasia: Secondary | ICD-10-CM

## 2014-05-05 DIAGNOSIS — Z0000 Encounter for general adult medical examination without abnormal findings: Secondary | ICD-10-CM

## 2014-05-05 LAB — URINALYSIS
Bilirubin Urine: NEGATIVE
HGB URINE DIPSTICK: NEGATIVE
KETONES UR: NEGATIVE
LEUKOCYTES UA: NEGATIVE
Nitrite: NEGATIVE
Specific Gravity, Urine: 1.02 (ref 1.000–1.030)
Total Protein, Urine: NEGATIVE
UROBILINOGEN UA: 0.2 (ref 0.0–1.0)
Urine Glucose: NEGATIVE
pH: 6 (ref 5.0–8.0)

## 2014-05-05 LAB — CBC WITH DIFFERENTIAL/PLATELET
BASOS PCT: 0.3 % (ref 0.0–3.0)
Basophils Absolute: 0 10*3/uL (ref 0.0–0.1)
EOS PCT: 1.5 % (ref 0.0–5.0)
Eosinophils Absolute: 0.1 10*3/uL (ref 0.0–0.7)
HEMATOCRIT: 42.8 % (ref 39.0–52.0)
Hemoglobin: 14.7 g/dL (ref 13.0–17.0)
LYMPHS ABS: 1.1 10*3/uL (ref 0.7–4.0)
Lymphocytes Relative: 22.4 % (ref 12.0–46.0)
MCHC: 34.5 g/dL (ref 30.0–36.0)
MCV: 86.2 fl (ref 78.0–100.0)
MONO ABS: 0.5 10*3/uL (ref 0.1–1.0)
Monocytes Relative: 8.8 % (ref 3.0–12.0)
Neutro Abs: 3.4 10*3/uL (ref 1.4–7.7)
Neutrophils Relative %: 67 % (ref 43.0–77.0)
Platelets: 228 10*3/uL (ref 150.0–400.0)
RBC: 4.96 Mil/uL (ref 4.22–5.81)
RDW: 14.3 % (ref 11.5–15.5)
WBC: 5.1 10*3/uL (ref 4.0–10.5)

## 2014-05-05 LAB — BASIC METABOLIC PANEL
BUN: 20 mg/dL (ref 6–23)
CHLORIDE: 102 meq/L (ref 96–112)
CO2: 29 mEq/L (ref 19–32)
Calcium: 9.7 mg/dL (ref 8.4–10.5)
Creatinine, Ser: 1.18 mg/dL (ref 0.40–1.50)
GFR: 66.63 mL/min (ref >60.00)
Glucose, Bld: 101 mg/dL — ABNORMAL HIGH (ref 70–99)
Potassium: 4.5 mEq/L (ref 3.5–5.1)
SODIUM: 137 meq/L (ref 135–145)

## 2014-05-05 LAB — LIPID PANEL
CHOL/HDL RATIO: 6
CHOLESTEROL: 239 mg/dL — AB (ref 0–200)
HDL: 42.5 mg/dL (ref >39.00)
LDL CALC: 164 mg/dL — AB (ref 0–99)
NonHDL: 196.5
TRIGLYCERIDES: 164 mg/dL — AB (ref 0.0–149.0)
VLDL: 32.8 mg/dL (ref 0.0–40.0)

## 2014-05-05 LAB — HEPATIC FUNCTION PANEL
ALBUMIN: 4.6 g/dL (ref 3.5–5.2)
ALT: 56 U/L — ABNORMAL HIGH (ref 0–53)
AST: 34 U/L (ref 0–37)
Alkaline Phosphatase: 102 U/L (ref 39–117)
BILIRUBIN DIRECT: 0.1 mg/dL (ref 0.0–0.3)
BILIRUBIN TOTAL: 0.6 mg/dL (ref 0.2–1.2)
Total Protein: 7.9 g/dL (ref 6.0–8.3)

## 2014-05-05 LAB — VITAMIN D 25 HYDROXY (VIT D DEFICIENCY, FRACTURES): VITD: 35.56 ng/mL (ref 30.00–100.00)

## 2014-05-05 LAB — PSA: PSA: 0.95 ng/mL (ref 0.10–4.00)

## 2014-05-05 LAB — TSH: TSH: 1.04 u[IU]/mL (ref 0.35–4.50)

## 2014-05-05 LAB — VITAMIN B12: VITAMIN B 12: 857 pg/mL (ref 211–911)

## 2014-08-11 ENCOUNTER — Encounter: Payer: Self-pay | Admitting: Gastroenterology

## 2015-03-30 ENCOUNTER — Ambulatory Visit (INDEPENDENT_AMBULATORY_CARE_PROVIDER_SITE_OTHER): Payer: Managed Care, Other (non HMO) | Admitting: Internal Medicine

## 2015-03-30 ENCOUNTER — Encounter: Payer: Self-pay | Admitting: Internal Medicine

## 2015-03-30 VITALS — BP 150/80 | HR 86 | Wt 225.0 lb

## 2015-03-30 DIAGNOSIS — K2100 Gastro-esophageal reflux disease with esophagitis, without bleeding: Secondary | ICD-10-CM

## 2015-03-30 DIAGNOSIS — Z23 Encounter for immunization: Secondary | ICD-10-CM | POA: Diagnosis not present

## 2015-03-30 DIAGNOSIS — I10 Essential (primary) hypertension: Secondary | ICD-10-CM

## 2015-03-30 DIAGNOSIS — N400 Enlarged prostate without lower urinary tract symptoms: Secondary | ICD-10-CM

## 2015-03-30 DIAGNOSIS — K2270 Barrett's esophagus without dysplasia: Secondary | ICD-10-CM | POA: Diagnosis not present

## 2015-03-30 MED ORDER — AMLODIPINE-OLMESARTAN 5-40 MG PO TABS: 0.5000 | ORAL_TABLET | Freq: Every day | ORAL | Status: DC

## 2015-03-30 MED ORDER — PRAMOXINE-HC 1-2.5 % EX OINT: TOPICAL_OINTMENT | CUTANEOUS | Status: DC

## 2015-03-30 MED ORDER — TADALAFIL 5 MG PO TABS: ORAL_TABLET | ORAL | Status: DC

## 2015-03-30 MED ORDER — PANTOPRAZOLE SODIUM 40 MG PO TBEC: 40.0000 mg | DELAYED_RELEASE_TABLET | Freq: Every day | ORAL | Status: DC

## 2015-03-30 NOTE — Assessment & Plan Note (Signed)
Cialis 5 mg/d - doing well

## 2015-03-30 NOTE — Assessment & Plan Note (Signed)
Protonix 40 mg/d Dr Stark  

## 2015-03-30 NOTE — Assessment & Plan Note (Signed)
Chronic  Nl BP at home Azor 1/2 tab/d

## 2015-03-30 NOTE — Progress Notes (Signed)
Subjective:  Patient ID: Corey [name], male    DOB: May 02, 1950  Age: 65 y.o. MRN: [id number]  CC: No chief complaint on file.   HPI Corey [name] presents for HTN, BPH, GERD f/u  Outpatient Prescriptions Prior to Visit  Medication Sig Dispense Refill  . cetirizine (ZYRTEC) 10 MG tablet Take 10 mg by mouth 2 (two) times daily.     . Cholecalciferol (VITAMIN D3) 1000 UNITS tablet Take 1,000 Units by mouth daily.      . diclofenac sodium (VOLTAREN) 1 % GEL Apply 4 g topically 4 (four) times daily. 100 g 3  . promethazine (PHENERGAN) 12.5 MG tablet Take 2 tablets (25 mg total) by mouth every 8 (eight) hours as needed for nausea. 20 tablet 0  . amLODipine-olmesartan (AZOR) 5-40 MG per tablet Take 0.5 tablets by mouth daily. 45 tablet 3  . pantoprazole (PROTONIX) 20 MG tablet Take 1 tablet (20 mg total) by mouth daily. 30 tablet 3  . Pramoxine-HC (PRAMOSONE) 1-2.5 % OINT Use bid 28.4 g 4  . tadalafil (CIALIS) 5 MG tablet TAKE 1 TABLET IN THE MORNING. 90 tablet 3   Facility-Administered Medications Prior to Visit  Medication Dose Route Frequency Provider Last Rate Last Dose  . methylPREDNISolone acetate (DEPO-MEDROL) injection 80 mg  80 mg Intra-articular Once Yer Castello V, MD        ROS Review of Systems  Constitutional: Negative for appetite change, fatigue and unexpected weight change.  HENT: Negative for congestion, nosebleeds, sneezing, sore throat and trouble swallowing.   Eyes: Negative for itching and visual disturbance.  Respiratory: Negative for cough.   Cardiovascular: Negative for chest pain, palpitations and leg swelling.  Gastrointestinal: Negative for nausea, diarrhea, blood in stool and abdominal distention.  Genitourinary: Negative for frequency and hematuria.  Musculoskeletal: Negative for back pain, joint swelling, gait problem and neck pain.  Skin: Negative for rash.  Neurological: Negative for dizziness, tremors, speech difficulty and weakness.    Psychiatric/Behavioral: Negative for sleep disturbance, dysphoric mood and agitation. The patient is not nervous/anxious.     Objective:  BP 150/80 mmHg  Pulse 86  Wt 225 lb (102.059 kg)  SpO2 97%  BP Readings from Last 3 Encounters:  03/30/15 150/80  04/28/14 140/70  03/08/14 122/70    Wt Readings from Last 3 Encounters:  03/30/15 225 lb (102.059 kg)  04/28/14 221 lb (100.245 kg)  03/08/14 219 lb (99.338 kg)    Physical Exam  Constitutional: He is oriented to person, place, and time. He appears well-developed. No distress.  NAD  HENT:  Mouth/Throat: Oropharynx is clear and moist.  Eyes: Conjunctivae are normal. Pupils are equal, round, and reactive to light.  Neck: Normal range of motion. No JVD present. No thyromegaly present.  Cardiovascular: Normal rate, regular rhythm, normal heart sounds and intact distal pulses.  Exam reveals no gallop and no friction rub.   No murmur heard. Pulmonary/Chest: Effort normal and breath sounds normal. No respiratory distress. He has no wheezes. He has no rales. He exhibits no tenderness.  Abdominal: Soft. Bowel sounds are normal. He exhibits no distension and no mass. There is no tenderness. There is no rebound and no guarding.  Musculoskeletal: Normal range of motion. He exhibits no edema or tenderness.  Lymphadenopathy:    He has no cervical adenopathy.  Neurological: He is alert and oriented to person, place, and time. He has normal reflexes. No cranial nerve deficit. He exhibits normal muscle tone. He displays a negative Romberg sign.  Coordination and gait normal.  Skin: Skin is warm and dry. No rash noted.  Psychiatric: He has a normal mood and affect. His behavior is normal. Judgment and thought content normal.    Lab Results  Component Value Date   WBC 5.1 05/05/2014   HGB 14.7 05/05/2014   HCT 42.8 05/05/2014   PLT 228.0 05/05/2014   GLUCOSE 101* 05/05/2014   CHOL 239* 05/05/2014   TRIG 164.0* 05/05/2014   HDL 42.50  05/05/2014   LDLDIRECT 191.5 04/15/2013   LDLDIRECT 193.6 04/15/2013   LDLCALC 164* 05/05/2014   ALT 56* 05/05/2014   AST 34 05/05/2014   NA 137 05/05/2014   K 4.5 05/05/2014   CL 102 05/05/2014   CREATININE 1.18 05/05/2014   BUN 20 05/05/2014   CO2 29 05/05/2014   TSH 1.04 05/05/2014   PSA 0.95 05/05/2014   HGBA1C 5.9 05/16/2009    Dg Hip Complete Left  08/18/2012  *RADIOLOGY REPORT* Clinical Data: Left hip pain LEFT HIP - COMPLETE 2+ VIEW Comparison: None. Findings: Frontal view the pelvis and AP and frog-leg lateral views of the left hip show no fracture.  No worrisome lytic or sclerotic osseous abnormality.  The SI joints and symphysis pubis are normal. IMPRESSION: No acute bony findings.  No evidence to explain the patient's history of left hip pain. Original Report Authenticated By: Misty Stanley, M.D.    Assessment & Plan:   Diagnoses and all orders for this visit:  Essential hypertension  Gastroesophageal reflux disease with esophagitis  Barrett's esophagus without dysplasia  BPH (benign prostatic hyperplasia)  Need for shingles vaccine -     Varicella-zoster vaccine subcutaneous  Other orders -     amLODipine-olmesartan (AZOR) 5-40 MG tablet; Take 0.5 tablets by mouth daily. -     Pramoxine-HC (PRAMOSONE) 1-2.5 % OINT; Use bid -     tadalafil (CIALIS) 5 MG tablet; TAKE 1 TABLET IN THE MORNING. -     pantoprazole (PROTONIX) 40 MG tablet; Take 1 tablet (40 mg total) by mouth daily.  I have discontinued Corey [name] pantoprazole. I have also changed his amLODipine-olmesartan and pantoprazole. Additionally, I am having him maintain his cholecalciferol, cetirizine, promethazine, diclofenac sodium, tobramycin-dexamethasone, Pramoxine-HC, and tadalafil. We will continue to administer methylPREDNISolone acetate.  Meds ordered this encounter  Medications  . tobramycin-dexamethasone (TOBRADEX) ophthalmic solution    Sig: daily.  Marland Kitchen DISCONTD: pantoprazole (PROTONIX) 40 MG  tablet    Sig: Take 40 mg by mouth daily.  Marland Kitchen amLODipine-olmesartan (AZOR) 5-40 MG tablet    Sig: Take 0.5 tablets by mouth daily.    Dispense:  45 tablet    Refill:  3  . Pramoxine-HC (PRAMOSONE) 1-2.5 % OINT    Sig: Use bid    Dispense:  28.4 g    Refill:  4  . tadalafil (CIALIS) 5 MG tablet    Sig: TAKE 1 TABLET IN THE MORNING.    Dispense:  90 tablet    Refill:  3  . pantoprazole (PROTONIX) 40 MG tablet    Sig: Take 1 tablet (40 mg total) by mouth daily.    Dispense:  90 tablet    Refill:  3     Follow-up: Return in about 6 months (around 09/27/2015) for Wellness Exam.  Walker Kehr, MD

## 2015-04-06 ENCOUNTER — Telehealth: Payer: Self-pay | Admitting: Internal Medicine

## 2015-04-06 MED ORDER — AMLODIPINE-OLMESARTAN 5-40 MG PO TABS: 0.5000 | ORAL_TABLET | Freq: Every day | ORAL | Status: DC

## 2015-04-06 NOTE — Telephone Encounter (Signed)
Pt called and he is out of his amLODipine-olmesartan (AZOR) 5-40 MG tablet [id number]. He called his mail order and they have been shipped but will probably take awhile. They told him to call us to see if we can get a weeks worth sent to [facility] on [address].

## 2015-04-06 NOTE — Telephone Encounter (Signed)
Done

## 2015-06-11 ENCOUNTER — Other Ambulatory Visit: Payer: Self-pay | Admitting: Physician Assistant

## 2015-10-22 ENCOUNTER — Ambulatory Visit (INDEPENDENT_AMBULATORY_CARE_PROVIDER_SITE_OTHER): Payer: Managed Care, Other (non HMO) | Admitting: Nurse Practitioner

## 2015-10-22 ENCOUNTER — Encounter: Payer: Self-pay | Admitting: Nurse Practitioner

## 2015-10-22 VITALS — BP 136/72 | HR 83 | Temp 98.5°F | Wt 212.1 lb

## 2015-10-22 DIAGNOSIS — M7061 Trochanteric bursitis, right hip: Secondary | ICD-10-CM | POA: Diagnosis not present

## 2015-10-22 MED ORDER — LIDOCAINE HCL (PF) 1 % IJ SOLN: 1.0000 mL | Freq: Once | INTRAMUSCULAR | Status: DC

## 2015-10-22 MED ORDER — DICLOFENAC SODIUM 1 % TD GEL: 4.0000 g | Freq: Three times a day (TID) | TRANSDERMAL | 3 refills | Status: DC | PRN

## 2015-10-22 MED ORDER — METHYLPREDNISOLONE ACETATE 40 MG/ML IJ SUSP
40.0000 mg | Freq: Once | INTRAMUSCULAR | Status: AC
  Administered 2015-10-22: 40 mg via INTRAMUSCULAR

## 2015-10-22 NOTE — Progress Notes (Signed)
Pre visit review using our clinic review tool, if applicable. No additional management support is needed unless otherwise documented below in the visit note. 

## 2015-10-22 NOTE — Patient Instructions (Signed)
Rest joint for 24hrs. Apply cold compress 2-3times a day (for 15-51mins at a time). Return to normal activity in 48hrs.  Hip Bursitis Bursitis is a swelling and soreness (inflammation) of a fluid-filled sac (bursa). This sac overlies and protects the joints.  CAUSES   Injury.  Overuse of the muscles surrounding the joint.  Arthritis.  Gout.  Infection.  Cold weather.  Inadequate warm-up and conditioning prior to activities. The cause may not be known.  SYMPTOMS   Mild to severe irritation.  Tenderness and swelling over the outside of the hip.  Pain with motion of the hip.  If the bursa becomes infected, a fever may be present. Redness, tenderness, and warmth will develop over the hip. Symptoms usually lessen in 3 to 4 weeks with treatment, but can come back. TREATMENT If conservative treatment does not work, your caregiver may advise draining the bursa and injecting cortisone into the area. This may speed up the healing process. This may also be used as an initial treatment of choice. HOME CARE INSTRUCTIONS   Apply ice to the affected area for 15-20 minutes every 3 to 4 hours while awake for the first 2 days. Put the ice in a plastic bag and place a towel between the bag of ice and your skin.  Rest the painful joint as much as possible, but continue to put the joint through a normal range of motion at least 4 times per day. When the pain lessens, begin normal, slow movements and usual activities to help prevent stiffness of the hip.  Only take over-the-counter or prescription medicines for pain, discomfort, or fever as directed by your caregiver.  Use crutches to limit weight bearing on the hip joint, if advised.  Elevate your painful hip to reduce swelling. Use pillows for propping and cushioning your legs and hips.  Gentle massage may provide comfort and decrease swelling. SEEK IMMEDIATE MEDICAL CARE IF:   Your pain increases even during treatment, or you are not  improving.  You have a fever.  You have heat and inflammation over the involved bursa.  You have any other questions or concerns. MAKE SURE YOU:   Understand these instructions.  Will watch your condition.  Will get help right away if you are not doing well or get worse.   This information is not intended to replace advice given to you by your health care provider. Make sure you discuss any questions you have with your health care provider.   Document Released: 08/02/2001 Document Revised: 05/05/2011 Document Reviewed: 09/12/2014 Elsevier Interactive Patient Education [employer].

## 2015-10-22 NOTE — Progress Notes (Signed)
Subjective:  Patient ID: Corey [name], male    DOB: 08-01-1950  Age: 65 y.o. MRN: [id number]  CC: Hip Pain ((R) Hip pain. Pt states he was working in the yard on Sat ? pull muscle been in pain since. Pain goes down (R) leg)  HPI: Presents with right hip pain x 3days, denies any fall, no previous injury or surgery to joint, Onset after doing yard work on Saturday. Denies any weakness, no fever, no numbness, no redness, no swelling. Pain is worse with walking, and improves with rest. Never had hip joint injection.  Outpatient Medications Prior to Visit  Medication Sig Dispense Refill  . amLODipine-olmesartan (AZOR) 5-40 MG tablet Take 0.5 tablets by mouth daily. 30 tablet 0  . cetirizine (ZYRTEC) 10 MG tablet Take 10 mg by mouth 2 (two) times daily.     . Cholecalciferol (VITAMIN D3) 1000 UNITS tablet Take 1,000 Units by mouth daily.      . pantoprazole (PROTONIX) 40 MG tablet Take 1 tablet (40 mg total) by mouth daily. 90 tablet 3  . Pramoxine-HC (PRAMOSONE) 1-2.5 % OINT Use bid 28.4 g 4  . promethazine (PHENERGAN) 12.5 MG tablet Take 2 tablets (25 mg total) by mouth every 8 (eight) hours as needed for nausea. 20 tablet 0  . tadalafil (CIALIS) 5 MG tablet TAKE 1 TABLET IN THE MORNING. 90 tablet 3  . tobramycin-dexamethasone (TOBRADEX) ophthalmic solution daily.    . diclofenac sodium (VOLTAREN) 1 % GEL Apply 4 g topically 4 (four) times daily. 100 g 3   Facility-Administered Medications Prior to Visit  Medication Dose Route Frequency Provider Last Rate Last Dose  . methylPREDNISolone acetate (DEPO-MEDROL) injection 80 mg  80 mg Intra-articular Once Cassandria Anger, MD        ROS Review of Systems  Constitutional: Negative.   Gastrointestinal: Negative for constipation, diarrhea, nausea and vomiting.  Genitourinary: Negative for dysuria, hematuria, penile swelling, scrotal swelling and testicular pain.  Musculoskeletal: Positive for arthralgias and gait problem. Negative for  back pain and joint swelling.  Skin: Negative for color change, rash and wound.  Neurological: Negative for weakness and numbness.  Hematological: Negative for adenopathy.    Objective:  BP 136/72 (BP Location: Left Arm)   Pulse 83   Temp 98.5 F (36.9 C) (Oral)   Wt 212 lb 1.9 oz (96.2 kg)   SpO2 96%   BMI 27.23 kg/m   BP Readings from Last 3 Encounters:  10/22/15 136/72  03/30/15 (!) 150/80  04/28/14 140/70    Wt Readings from Last 3 Encounters:  10/22/15 212 lb 1.9 oz (96.2 kg)  03/30/15 225 lb (102.1 kg)  04/28/14 221 lb (100.2 kg)    Physical Exam  Constitutional: He is oriented to person, place, and time. He appears well-developed.  Cardiovascular: Normal rate.   Pulmonary/Chest: Effort normal.  Abdominal: Soft.  Musculoskeletal: He exhibits tenderness. He exhibits no edema.       Right hip: He exhibits decreased range of motion and tenderness. He exhibits normal strength and no swelling.       Right knee: Normal.       Right ankle: Normal.       Lumbar back: Normal.  Right lateral hip joint tenderness, no redness, no rash, no induration, skin intact.  Neurological: He is alert and oriented to person, place, and time.  Ambulates with slight limp  Skin: Skin is warm and dry.  Vitals reviewed.   Lab Results  Component Value Date  WBC 5.1 05/05/2014   HGB 14.7 05/05/2014   HCT 42.8 05/05/2014   PLT 228.0 05/05/2014   GLUCOSE 101 (H) 05/05/2014   CHOL 239 (H) 05/05/2014   TRIG 164.0 (H) 05/05/2014   HDL 42.50 05/05/2014   LDLDIRECT 191.5 04/15/2013   LDLDIRECT 193.6 04/15/2013   LDLCALC 164 (H) 05/05/2014   ALT 56 (H) 05/05/2014   AST 34 05/05/2014   NA 137 05/05/2014   K 4.5 05/05/2014   CL 102 05/05/2014   CREATININE 1.18 05/05/2014   BUN 20 05/05/2014   CO2 29 05/05/2014   TSH 1.04 05/05/2014   PSA 0.95 05/05/2014   HGBA1C 5.9 05/16/2009    Dg Hip Complete Left  Procedure Note :     Procedure : Joint Injection,     hip   Indication:   Trochanteric bursitis with refractory pain.   Risks including unsuccessful procedure , bleeding, infection, bruising, skin atrophy, "steroid flare-up" and others were explained to the patient in detail as well as the benefits. Informed consent was obtained and signed.   Tthe patient was placed in a comfortable lateral decubitus position. The point of maximal tenderness was identified. Skin was prepped with Betadine and alcohol. Then, a 64ml syringe with a 1.5 inch long 23-gauge needle was used for a bursa injection.. The needle was advanced  Into the bursa. I injected the bursa with 69mL of 2% lidocaine and 40 mg of Depo-Medrol .  Band-Aid was applied.   Tolerated well. Complications: None. Good pain relief following the procedure.   Postprocedure instructions :   A Band-Aid should be left on for 12 hours. Injection therapy is not a cure itself. It is used in conjunction with other modalities. You can use nonsteroidal anti-inflammatories like ibuprofen , hot and cold compresses. Rest is recommended in the next 24 hours. You need to report immediately  if fever, chills or any signs of infection develop.  Assessment & Plan:   Corey [name] was seen today for hip pain.  Diagnoses and all orders for this visit:  Greater trochanteric bursitis of right hip -     Discontinue: diclofenac sodium (VOLTAREN) 1 % GEL; Apply 4 g topically 3 (three) times daily as needed. -     diclofenac sodium (VOLTAREN) 1 % GEL; Apply 4 g topically 3 (three) times daily as needed. -     methylPREDNISolone acetate (DEPO-MEDROL) injection 40 mg; Inject 1 mL (40 mg total) into the muscle once. -     lidocaine (PF) (XYLOCAINE) 1 % injection 1 mL; 1 mL by Other route once.   I am having Corey [name] maintain his cholecalciferol, cetirizine, promethazine, tobramycin-dexamethasone, Pramoxine-HC, tadalafil, pantoprazole, amLODipine-olmesartan, and diclofenac sodium. We administered methylPREDNISolone acetate. We will continue to  administer methylPREDNISolone acetate and lidocaine (PF).  Meds ordered this encounter  Medications  . DISCONTD: diclofenac sodium (VOLTAREN) 1 % GEL    Sig: Apply 4 g topically 3 (three) times daily as needed.    Dispense:  100 g    Refill:  3    Order Specific Question:   Supervising Provider    Answer:   Cassandria Anger [[id number]]  . diclofenac sodium (VOLTAREN) 1 % GEL    Sig: Apply 4 g topically 3 (three) times daily as needed.    Dispense:  100 g    Refill:  3    Order Specific Question:   Supervising Provider    Answer:   Cassandria Anger [[id number]]  . methylPREDNISolone acetate (DEPO-MEDROL) injection 40  mg  . lidocaine (PF) (XYLOCAINE) 1 % injection 1 mL    Follow-up: Return if symptoms worsen or fail to improve.  Wilfred Lacy, NP

## 2015-11-02 ENCOUNTER — Telehealth: Payer: Self-pay | Admitting: Emergency Medicine

## 2015-11-02 MED ORDER — CYCLOBENZAPRINE HCL 5 MG PO TABS: 5.0000 mg | ORAL_TABLET | Freq: Three times a day (TID) | ORAL | 0 refills | Status: DC | PRN

## 2015-11-02 NOTE — Telephone Encounter (Signed)
I'll email Rx for flexeril Thx

## 2015-11-02 NOTE — Telephone Encounter (Signed)
Pt called and is having leg, ankle and foot pain. There are no available appointments at this location today. He was wondering if you can call a muscle relaxer in until he is able to be seen next week. Please advise thanks.

## 2015-11-21 ENCOUNTER — Ambulatory Visit (HOSPITAL_COMMUNITY)
Admission: RE | Admit: 2015-11-21 | Discharge: 2015-11-21 | Disposition: A | Discharge status: Home / Self Care | Payer: Managed Care, Other (non HMO) | Source: Ambulatory Visit | Attending: Cardiovascular Disease | Admitting: Cardiovascular Disease

## 2015-11-21 ENCOUNTER — Ambulatory Visit (INDEPENDENT_AMBULATORY_CARE_PROVIDER_SITE_OTHER): Payer: Managed Care, Other (non HMO) | Admitting: Internal Medicine

## 2015-11-21 ENCOUNTER — Telehealth: Payer: Self-pay

## 2015-11-21 ENCOUNTER — Other Ambulatory Visit (INDEPENDENT_AMBULATORY_CARE_PROVIDER_SITE_OTHER): Payer: Managed Care, Other (non HMO)

## 2015-11-21 ENCOUNTER — Encounter: Payer: Self-pay | Admitting: Internal Medicine

## 2015-11-21 DIAGNOSIS — M79604 Pain in right leg: Secondary | ICD-10-CM

## 2015-11-21 DIAGNOSIS — M7062 Trochanteric bursitis, left hip: Secondary | ICD-10-CM

## 2015-11-21 LAB — BASIC METABOLIC PANEL
BUN: 21 mg/dL (ref 6–23)
CO2: 33 mEq/L — ABNORMAL HIGH (ref 19–32)
Calcium: 9.4 mg/dL (ref 8.4–10.5)
Chloride: 98 mEq/L (ref 96–112)
Creatinine, Ser: 1.05 mg/dL (ref 0.40–1.50)
GFR: 75.85 mL/min (ref >60.00)
Glucose, Bld: 160 mg/dL — ABNORMAL HIGH (ref 70–99)
POTASSIUM: 4.3 meq/L (ref 3.5–5.1)
SODIUM: 138 meq/L (ref 135–145)

## 2015-11-21 LAB — CBC WITH DIFFERENTIAL/PLATELET
BASOS PCT: 0 % (ref 0.0–3.0)
Basophils Absolute: 0 10*3/uL (ref 0.0–0.1)
EOS ABS: 0 10*3/uL (ref 0.0–0.7)
Eosinophils Relative: 0.6 % (ref 0.0–5.0)
HCT: 40.6 % (ref 39.0–52.0)
Hemoglobin: 13.9 g/dL (ref 13.0–17.0)
Lymphocytes Relative: 8.3 % — ABNORMAL LOW (ref 12.0–46.0)
Lymphs Abs: 0.6 10*3/uL — ABNORMAL LOW (ref 0.7–4.0)
MCHC: 34.2 g/dL (ref 30.0–36.0)
MCV: 87.2 fl (ref 78.0–100.0)
MONO ABS: 0.5 10*3/uL (ref 0.1–1.0)
Monocytes Relative: 6.1 % (ref 3.0–12.0)
NEUTROS ABS: 6.6 10*3/uL (ref 1.4–7.7)
NEUTROS PCT: 85 % — AB (ref 43.0–77.0)
PLATELETS: 256 10*3/uL (ref 150.0–400.0)
RBC: 4.65 Mil/uL (ref 4.22–5.81)
RDW: 14.4 % (ref 11.5–15.5)
WBC: 7.8 10*3/uL (ref 4.0–10.5)

## 2015-11-21 LAB — CK: Total CK: 45 U/L (ref 7–232)

## 2015-11-21 LAB — SEDIMENTATION RATE: SED RATE: 41 mm/h — AB (ref 0–20)

## 2015-11-21 LAB — URIC ACID: URIC ACID, SERUM: 5.3 mg/dL (ref 4.0–7.8)

## 2015-11-21 MED ORDER — TADALAFIL 5 MG PO TABS: ORAL_TABLET | ORAL | 3 refills | Status: DC

## 2015-11-21 MED ORDER — HYDROCODONE-ACETAMINOPHEN 5-325 MG PO TABS: 1.0000 | ORAL_TABLET | Freq: Four times a day (QID) | ORAL | 0 refills | Status: DC | PRN

## 2015-11-21 NOTE — Progress Notes (Signed)
Pre visit review using our clinic review tool, if applicable. No additional management support is needed unless otherwise documented below in the visit note. 

## 2015-11-21 NOTE — Telephone Encounter (Signed)
PA initiated via CoverMyMeds key [id number]

## 2015-11-21 NOTE — Assessment & Plan Note (Signed)
Much better 

## 2015-11-21 NOTE — Addendum Note (Signed)
Addended by: Cassandria Anger on: 11/21/2015 02:13 PM   Modules accepted: Orders

## 2015-11-21 NOTE — Progress Notes (Signed)
Subjective:  Patient ID: Corey [name], male    DOB: 25-Mar-1950  Age: 65 y.o. MRN: [id number]  CC: Hip Pain (Pt stated injection only last for about 1 week and the pain/cramps all the way to the foot)   HPI Corey [name] presents for a severe pain in the R leg pain x 2 weeks. R hip is better. The pain is in the knee and down. The pain is at rest 6/10; 10/10 with knee movement. He moved a 300 lbs stature in August  Outpatient Medications Prior to Visit  Medication Sig Dispense Refill  . amLODipine-olmesartan (AZOR) 5-40 MG tablet Take 0.5 tablets by mouth daily. 30 tablet 0  . cetirizine (ZYRTEC) 10 MG tablet Take 10 mg by mouth 2 (two) times daily.     . Cholecalciferol (VITAMIN D3) 1000 UNITS tablet Take 1,000 Units by mouth daily.      . cyclobenzaprine (FLEXERIL) 5 MG tablet Take 1 tablet (5 mg total) by mouth 3 (three) times daily as needed for muscle spasms. 30 tablet 0  . diclofenac sodium (VOLTAREN) 1 % GEL Apply 4 g topically 3 (three) times daily as needed. 100 g 3  . pantoprazole (PROTONIX) 40 MG tablet Take 1 tablet (40 mg total) by mouth daily. 90 tablet 3  . Pramoxine-HC (PRAMOSONE) 1-2.5 % OINT Use bid 28.4 g 4  . promethazine (PHENERGAN) 12.5 MG tablet Take 2 tablets (25 mg total) by mouth every 8 (eight) hours as needed for nausea. 20 tablet 0  . tadalafil (CIALIS) 5 MG tablet TAKE 1 TABLET IN THE MORNING. 90 tablet 3  . tobramycin-dexamethasone (TOBRADEX) ophthalmic solution daily.     Facility-Administered Medications Prior to Visit  Medication Dose Route Frequency Provider Last Rate Last Dose  . lidocaine (PF) (XYLOCAINE) 1 % injection 1 mL  1 mL Other Once Flossie Buffy, NP      . methylPREDNISolone acetate (DEPO-MEDROL) injection 80 mg  80 mg Intra-articular Once Cassandria Anger, MD        ROS Review of Systems  Constitutional: Negative for appetite change, fatigue and unexpected weight change.  HENT: Negative for congestion, nosebleeds, sneezing,  sore throat and trouble swallowing.   Eyes: Negative for itching and visual disturbance.  Respiratory: Negative for cough.   Cardiovascular: Negative for chest pain, palpitations and leg swelling.  Gastrointestinal: Negative for abdominal distention, blood in stool, diarrhea and nausea.  Genitourinary: Negative for frequency and hematuria.  Musculoskeletal: Positive for gait problem. Negative for back pain, joint swelling and neck pain.  Skin: Negative for rash.  Neurological: Negative for dizziness, tremors, speech difficulty and weakness.  Psychiatric/Behavioral: Negative for agitation, dysphoric mood and sleep disturbance. The patient is not nervous/anxious.     Objective:  BP (!) 142/92   Pulse 88   Temp 97 F (36.1 C)   Ht 6\' 2"  (1.88 m)   Wt 219 lb (99.3 kg)   SpO2 98%   BMI 28.12 kg/m   BP Readings from Last 3 Encounters:  11/21/15 (!) 142/92  10/22/15 136/72  03/30/15 (!) 150/80    Wt Readings from Last 3 Encounters:  11/21/15 219 lb (99.3 kg)  10/22/15 212 lb 1.9 oz (96.2 kg)  03/30/15 225 lb (102.1 kg)    Physical Exam  Constitutional: He is oriented to person, place, and time. He appears well-developed. No distress.  NAD  HENT:  Mouth/Throat: Oropharynx is clear and moist.  Eyes: Conjunctivae are normal. Pupils are equal, round, and reactive to  light.  Neck: Normal range of motion. No JVD present. No thyromegaly present.  Cardiovascular: Normal rate, regular rhythm, normal heart sounds and intact distal pulses.  Exam reveals no gallop and no friction rub.   No murmur heard. Pulmonary/Chest: Effort normal and breath sounds normal. No respiratory distress. He has no wheezes. He has no rales. He exhibits no tenderness.  Abdominal: Soft. Bowel sounds are normal. He exhibits no distension and no mass. There is no tenderness. There is no rebound and no guarding.  Musculoskeletal: Normal range of motion. He exhibits tenderness. He exhibits no edema.    Lymphadenopathy:    He has no cervical adenopathy.  Neurological: He is alert and oriented to person, place, and time. He has normal reflexes. No cranial nerve deficit. He exhibits normal muscle tone. He displays a negative Romberg sign. Coordination and gait normal.  Skin: Skin is warm and dry. No rash noted.  Psychiatric: He has a normal mood and affect. His behavior is normal. Judgment and thought content normal.  Pain in the R calf and ankle No rash No edema Str leg elev is (-) No bruising R knee and R knee are NT and w/o swelling  Lab Results  Component Value Date   WBC 5.1 05/05/2014   HGB 14.7 05/05/2014   HCT 42.8 05/05/2014   PLT 228.0 05/05/2014   GLUCOSE 101 (H) 05/05/2014   CHOL 239 (H) 05/05/2014   TRIG 164.0 (H) 05/05/2014   HDL 42.50 05/05/2014   LDLDIRECT 191.5 04/15/2013   LDLDIRECT 193.6 04/15/2013   LDLCALC 164 (H) 05/05/2014   ALT 56 (H) 05/05/2014   AST 34 05/05/2014   NA 137 05/05/2014   K 4.5 05/05/2014   CL 102 05/05/2014   CREATININE 1.18 05/05/2014   BUN 20 05/05/2014   CO2 29 05/05/2014   TSH 1.04 05/05/2014   PSA 0.95 05/05/2014   HGBA1C 5.9 05/16/2009    Dg Hip Complete Left  Result Date: 08/18/2012 *RADIOLOGY REPORT* Clinical Data: Left hip pain LEFT HIP - COMPLETE 2+ VIEW Comparison: None. Findings: Frontal view the pelvis and AP and frog-leg lateral views of the left hip show no fracture.  No worrisome lytic or sclerotic osseous abnormality.  The SI joints and symphysis pubis are normal. IMPRESSION: No acute bony findings.  No evidence to explain the patient's history of left hip pain. Original Report Authenticated By: Misty Stanley, M.D.    Assessment & Plan:   There are no diagnoses linked to this encounter. I am having [name] maintain his cholecalciferol, cetirizine, promethazine, tobramycin-dexamethasone, Pramoxine-HC, tadalafil, pantoprazole, amLODipine-olmesartan, diclofenac sodium, and cyclobenzaprine. We will continue to  administer methylPREDNISolone acetate and lidocaine (PF).  No orders of the defined types were placed in this encounter.    Follow-up: No Follow-up on file.  Walker Kehr, MD

## 2015-11-21 NOTE — Assessment & Plan Note (Signed)
9/17 ?etiology  - r/o DVT, hematoma, gout etc No radiculopathy, rash  Labs X ray Ddimer, CK Sports med ref

## 2015-11-22 LAB — D-DIMER, QUANTITATIVE (NOT AT ARMC): D DIMER QUANT: 0.23 ug{FEU}/mL (ref <0.50)

## 2015-11-26 ENCOUNTER — Telehealth: Payer: Self-pay | Admitting: Emergency Medicine

## 2015-11-26 NOTE — Telephone Encounter (Signed)
PA response states that pt does not have active coverage with [employer].  PA was submitted with information received directly from pt.  Pt advised that PA was cancelled and to follow up with [facility] regarding prescription coverage

## 2015-11-26 NOTE — Telephone Encounter (Signed)
Pt called and [employer] stated they will be faxing a form over for a PA. Their phone number is [phone]. Thanks.

## 2015-11-26 NOTE — Telephone Encounter (Signed)
Second PA initiated via CoverMyMeds key [id number]

## 2015-12-03 NOTE — Telephone Encounter (Signed)
Pt has received medication but coverage is limited. Pt states he will follow up directly with [facility] and let us know if there is anything else we can/should do.

## 2015-12-04 NOTE — Progress Notes (Signed)
[name] Corey [name], Corey [name] [id number] Phone: [phone] Subjective:    I'm seeing this patient by the request  of:  Walker Kehr, MD   CC: Right hip and leg pain   [id number]  Corey [name] is a 65 y.o. male coming in with complaint of right hip and leg pain. Patient's on primary care provider and was given an injection for a greater trochanteric bursitis. An states that the injection only last approximately 1 week. This was a little over one month ago. Patient states that he has had some severe pain in the right laying going on one month now. Patient saw primary care doctor and was sent for Doppler that was negative. Patient at the same time did have labs and was found to have an elevated sedimentation rate of 41. CBC was normal but did have a mild elevation in neutrophils. White blood cells overall were normal.  Patient continues to have pain. Seems to be worsening. Affecting daily activities and waking him up at night. Rates the severity pain as 9 out of 10 and most of it seems to be mostly in the groin area radiating down the anterior aspect of the leg towards the knee. Mild back pain with it as well.  Past Medical History:  Diagnosis Date  . Allergic rhinitis   . Asthma   . Barrett's esophagus   . GERD (gastroesophageal reflux disease)   . HTN (hypertension)   . Hyperlipidemia   . Nephrolithiasis    Past Surgical History:  Procedure Laterality Date  . ESOPHAGOGASTRODUODENOSCOPY  03/20/2006  . TONSILLECTOMY    . TRANSTHORACIC ECHOCARDIOGRAM  01/05/1998   Social History   Social History  . Marital status: Single    Spouse name: N/A  . Number of children: N/A  . Years of education: N/A   Occupational History  . 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 Smoking status: Never Smoker  . Smokeless tobacco: Never Used  . Alcohol use No  . Drug use: No  . Sexual activity: Not Currently     Other Topics Concern  . None   Social History Narrative   Single   Regular Exercise-yes   Occupation: Financial controller for [employer]            Allergies  Allergen Reactions  . Albuterol     REACTION: "feels like drowning"  . Ciprofloxacin     REACTION: rash  . Clarithromycin     REACTION: swelling  . Colesevelam     REACTION: constip  . Enalapril Maleate     REACTION: Erectile dysfunction  . Esomeprazole Magnesium     REACTION: epigastric pain  . Niacin     REACTION: diarrhea  . Penicillins     REACTION: rash  . Pravastatin Sodium     REACTION: aching, listless  . Rosuvastatin     REACTION: achy   Family History  Problem Relation Age of Onset  . Colon polyps Mother   . Heart disease Mother   . Hypertension      Past medical history, social, surgical and family history all reviewed in electronic medical record.  No pertanent information unless stated regarding to the chief complaint.   Review of Systems: No headache, visual changes, nausea, vomiting, diarrhea, constipation, dizziness, abdominal pain, skin rash, fevers, chills, night sweats, weight loss, swollen lymph nodes, body aches, joint swelling, muscle aches, chest pain, shortness of  breath, mood changes.   Objective  Blood pressure 126/80, pulse (!) 102, weight 219 lb (99.3 kg), SpO2 97 %.  General: No apparent distress alert and oriented x3 mood and affect normal, dressed appropriately.  HEENT: Pupils equal, extraocular movements intact  Respiratory: Patient's speak in full sentences and does not appear short of breath  Cardiovascular: No lower extremity edema, non tender, no erythema  Skin: Warm dry intact with no signs of infection or rash on extremities or on axial skeleton.  Abdomen: Soft nontender  Neuro: Cranial nerves II through XII are intact, neurovascularly intact in all extremities with 2+ DTRs and 2+ pulses.  Lymph: No lymphadenopathy of posterior or anterior cervical chain or  axillae bilaterally.  Gait Severely antalgic gait  MSK:  Non tender with full range of motion and good stability and symmetric strength and tone of shoulders, elbows, wrist, knee and ankles bilaterally.  Hip: Right ROM Inspection does show that patient's thigh is significant smaller than the contralateral side IR: 0 Deg with severe pain ER: 25 Deg, Flexion: 100 Deg, Extension: 80 Deg, Abduction: 25 Deg, Adduction: 15 Deg Strength 4-5 overall compared to 5 out of 5 on the contralateral side Pelvic alignment unremarkable to inspection and palpation. Standing hip rotation and gait without trendelenburg sign / unsteadiness. Greater trochanter without tenderness to palpation. No tenderness over piriformis and greater trochanter. Worsening pain with internal rotation in a flexed position No SI joint tenderness and normal minimal SI movement.  Positive straight leg test on the right side as well. Pain does seem to be severe and potentially out of proportion   Impression and Recommendations:     This case required medical decision making of moderate complexity.      Note: This dictation was prepared with Dragon dictation along with smaller phrase technology. Any transcriptional errors that result from this process are unintentional.

## 2015-12-05 ENCOUNTER — Ambulatory Visit (INDEPENDENT_AMBULATORY_CARE_PROVIDER_SITE_OTHER): Payer: Managed Care, Other (non HMO) | Admitting: Family Medicine

## 2015-12-05 ENCOUNTER — Encounter: Payer: Self-pay | Admitting: Family Medicine

## 2015-12-05 ENCOUNTER — Ambulatory Visit (INDEPENDENT_AMBULATORY_CARE_PROVIDER_SITE_OTHER)
Admission: RE | Admit: 2015-12-05 | Discharge: 2015-12-05 | Disposition: A | Discharge status: Home / Self Care | Payer: Managed Care, Other (non HMO) | Source: Ambulatory Visit | Attending: Internal Medicine | Admitting: Internal Medicine

## 2015-12-05 VITALS — BP 126/80 | HR 102 | Wt 219.0 lb

## 2015-12-05 DIAGNOSIS — M79604 Pain in right leg: Secondary | ICD-10-CM

## 2015-12-05 MED ORDER — NORTRIPTYLINE HCL 10 MG PO CAPS: 10.0000 mg | ORAL_CAPSULE | Freq: Every day | ORAL | 1 refills | Status: DC

## 2015-12-05 MED ORDER — PREDNISONE 50 MG PO TABS: 50.0000 mg | ORAL_TABLET | Freq: Every day | ORAL | 0 refills | Status: DC

## 2015-12-05 MED ORDER — VITAMIN D (ERGOCALCIFEROL) 1.25 MG (50000 UNIT) PO CAPS: 50000.0000 [IU] | ORAL_CAPSULE | ORAL | 0 refills | Status: DC

## 2015-12-05 NOTE — Patient Instructions (Addendum)
Good to see you  I am sorry for the bad news We will see if it is your back or your hip Xrays downstairs Prednisone daily for 5 days Nortriptyline at night Once weekly vitamin D for next 12 weeks. Continue the vitamins I want to see you again in 1 week if the hip xrays are normal (OK to double book)

## 2015-12-05 NOTE — Assessment & Plan Note (Signed)
Patient's right leg pain seems to be more secondary to either hip arthritis or lumbar radiculopathy. Patient does have significant decrease in range of motion of the left hip. Patient given prednisone, home exercises, x-rays are pending. Discussed pain medications and given nortriptyline at night. We discussed which activities to do an which was potentially avoid. Patient will be coming back in 1 week. With patient's pain being so severe as well as decreased muscle mass of the left thigh I am concerned that if any worsening symptoms advance imaging would be warranted. Patient continues to have the pain I would consider lumbar and hip x-rays to rule out any avascular necrosis from the lifting he could've contributed or lumbar with a herniated disc from the lifting as well.

## 2015-12-11 NOTE — Progress Notes (Signed)
[facility] [address] [place], [place] [address] Phone: [phone] Subjective:    I'm seeing this patient by the request  of:  Walker Kehr, MD   CC: Right hip and leg pain   [id number]  Corey [name] is a 65 y.o. male coming in with complaint of right hip and leg pain. Patient's on primary care provider and was given an injection for a greater trochanteric bursitis. An states that the injection only last approximately 1 week.   Did see me and there was concern for more of a lumbar radiculopathy. Patient was given prednisone. States that didn't help out significantly and started to have increasing pain again though since he's been off of it. Still 60% better than what it was. Still some discomfort mostly radiating down the leg with some intermittent numbness.sleeping a little more comfortably at night.   X-rays were ordered and independently visualized by me. Lumbar x-rays show mild to moderate osteophytic changes from L2-L4 X-ray of right hip shows very minimal degenerative changes.  Past Medical History:  Diagnosis Date  . Allergic rhinitis   . Asthma   . Barrett's esophagus   . GERD (gastroesophageal reflux disease)   . HTN (hypertension)   . Hyperlipidemia   . Nephrolithiasis    Past Surgical History:  Procedure Laterality Date  . ESOPHAGOGASTRODUODENOSCOPY  03/20/2006  . TONSILLECTOMY    . TRANSTHORACIC ECHOCARDIOGRAM  01/05/1998   Social History   Social History  . Marital status: Single    Spouse name: N/A  . Number of children: N/A  . Years of education: N/A   Occupational History  . 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 Smoking status: Never Smoker  . Smokeless tobacco: Never Used  . Alcohol use No  . Drug use: No  . Sexual activity: Not Currently   Other Topics Concern  . None   Social History Narrative   Single   Regular Exercise-yes   Occupation: Surveyor, quantity for [employer]            Allergies  Allergen Reactions  . Albuterol     REACTION: "feels like drowning"  . Ciprofloxacin     REACTION: rash  . Clarithromycin     REACTION: swelling  . Colesevelam     REACTION: constip  . Enalapril Maleate     REACTION: Erectile dysfunction  . Esomeprazole Magnesium     REACTION: epigastric pain  . Niacin     REACTION: diarrhea  . Penicillins     REACTION: rash  . Pravastatin Sodium     REACTION: aching, listless  . Rosuvastatin     REACTION: achy   Family History  Problem Relation Age of Onset  . Colon polyps Mother   . Heart disease Mother   . Hypertension      Past medical history, social, surgical and family history all reviewed in electronic medical record.  No pertanent information unless stated regarding to the chief complaint.   Review of Systems: No headache, visual changes, nausea, vomiting, diarrhea, constipation, dizziness, abdominal pain, skin rash, fevers, chills, night sweats, weight loss, swollen lymph nodes, body aches, joint swelling, muscle aches, chest pain, shortness of breath, mood changes.   Objective  Blood pressure 130/72, pulse (!) 116, weight 221 lb (100.2 kg), SpO2 97 %.  General: No apparent distress alert and oriented x3 mood and affect normal, dressed appropriately.  HEENT: Pupils equal, extraocular  movements intact  Respiratory: Patient's speak in full sentences and does not appear short of breath  Cardiovascular: No lower extremity edema, non tender, no erythema  Skin: Warm dry intact with no signs of infection or rash on extremities or on axial skeleton.  Abdomen: Soft nontender  Neuro: Cranial nerves II through XII are intact, neurovascularly intact in all extremities with 2+ DTRs and 2+ pulses.  Lymph: No lymphadenopathy of posterior or anterior cervical chain or axillae bilaterally.  Gait Severely antalgic gait  MSK:  Non tender with full range of motion and good stability and  symmetric strength and tone of shoulders, elbows, wrist, knee and ankles bilaterally.  Hip: Right ROM Continued to have atrophy on the right side IR: 5 Deg with severe pain With mild improvement ER: 25 Deg, Flexion: 100 Deg, Extension: 80 Deg, Abduction: 25 Deg, Adduction: 15 Deg Strength 4+/5 overall compared to 5 out of 5 on the contralateral side Pelvic alignment unremarkable to inspection and palpation. Standing hip rotation and gait without trendelenburg sign / unsteadiness. Greater trochanter without tenderness to palpation. No tenderness over piriformis and greater trochanter. No SI joint tenderness and normal minimal SI movement.  Positive straight leg test on the right side still present.   Impression and Recommendations:     This case required medical decision making of moderate complexity.      Note: This dictation was prepared with Dragon dictation along with smaller phrase technology. Any transcriptional errors that result from this process are unintentional.

## 2015-12-12 ENCOUNTER — Ambulatory Visit (INDEPENDENT_AMBULATORY_CARE_PROVIDER_SITE_OTHER): Payer: Managed Care, Other (non HMO) | Admitting: Family Medicine

## 2015-12-12 ENCOUNTER — Encounter: Payer: Self-pay | Admitting: Family Medicine

## 2015-12-12 DIAGNOSIS — M5416 Radiculopathy, lumbar region: Secondary | ICD-10-CM | POA: Diagnosis not present

## 2015-12-12 MED ORDER — DIAZEPAM 5 MG PO TABS: ORAL_TABLET | ORAL | 0 refills | Status: DC

## 2015-12-12 MED ORDER — NORTRIPTYLINE HCL 10 MG PO CAPS: 20.0000 mg | ORAL_CAPSULE | Freq: Every day | ORAL | 1 refills | Status: DC

## 2015-12-12 NOTE — Patient Instructions (Addendum)
Good to see you  Valium if we get the MRI.  Go to the chiropractor Stay active.  Ice 20 minutes 2 times daily. Usually after activity and before bed. Keep doing the exercises.  See me again in 4 weeks or call me sooner if you want the MRI.

## 2015-12-12 NOTE — Assessment & Plan Note (Signed)
Even though patient has mild arthritic changes on x-ray I do feel though this is more likely a lumbar radiculopathy with continued positive straight leg test. Still be hip is significantly improved. Differential is doing still quite broad. Decline formal physical therapy. We'll start with home exercises, icing protocol, which activities doing which ones to avoid. Patient will give this 4 weeks and see if this continues to improve. If worsening symptoms including potential weakness than we like patient to to call us and we will get a dance imaging. Especially the patient having weakness as well as atrophy of the musculature. Increased nortriptyline to 20mg  at night

## 2015-12-17 ENCOUNTER — Ambulatory Visit (INDEPENDENT_AMBULATORY_CARE_PROVIDER_SITE_OTHER): Payer: Managed Care, Other (non HMO) | Admitting: Family Medicine

## 2015-12-17 ENCOUNTER — Encounter: Payer: Self-pay | Admitting: Family Medicine

## 2015-12-17 VITALS — BP 110/72 | HR 118

## 2015-12-17 DIAGNOSIS — M79604 Pain in right leg: Secondary | ICD-10-CM

## 2015-12-17 DIAGNOSIS — M5416 Radiculopathy, lumbar region: Secondary | ICD-10-CM

## 2015-12-17 MED ORDER — TRAMADOL HCL 50 MG PO TABS: 50.0000 mg | ORAL_TABLET | Freq: Three times a day (TID) | ORAL | 0 refills | Status: DC | PRN

## 2015-12-17 MED ORDER — KETOROLAC TROMETHAMINE 60 MG/2ML IM SOLN
60.0000 mg | Freq: Once | INTRAMUSCULAR | Status: AC
  Administered 2015-12-17: 60 mg via INTRAMUSCULAR

## 2015-12-17 MED ORDER — METHYLPREDNISOLONE ACETATE 80 MG/ML IJ SUSP
80.0000 mg | Freq: Once | INTRAMUSCULAR | Status: AC
  Administered 2015-12-17: 80 mg via INTRAMUSCULAR

## 2015-12-17 NOTE — Assessment & Plan Note (Signed)
Patient is having worsening weakness as well as pain. Patient is some positive straight leg test that seems to be worsening. Mild antalgic gait as well as increasing weakness which is new. Discussed with patient at great length. Do feel that advance imaging is warranted. 2 injections given today to see if there be beneficial. We discussed tramadol and given up her prescription in. Patient will be set up for the MRI and if there is any nerve root impingement or avascular necrosis of the hip further treatment will be done.

## 2015-12-17 NOTE — Patient Instructions (Signed)
Good to see you  Corey [name] is your friend.  2 injections today  We will get MRI  Tramadol up to 2 times daily with 1 tylenol for severe pain .  We will discuss when we have the MRI or if worsening symptoms, bowel or bladder incontinence \\please  seek medical attention immediately.

## 2015-12-17 NOTE — Progress Notes (Signed)
[name] [address] [place], [place] [address] Phone: [phone] Subjective:    CC: Right hip and leg pain f/u   [id number]  Corey [name] is a 65 y.o. male coming in with complaint of right hip and leg pain. Patient's on primary care provider and was given an injection for a greater trochanteric bursitis. An states that the injection only last approximately 1 week.   Did see me and there was concern for more of a lumbar radiculopathy. Patient was given prednisone. States that didn't help out significantly and started to have increasing pain again though since he's been off of it.  Patient was doing 60% better at follow-up. Unfortunately bear patient went to a chiropractor. Patient states that within 15 minutes of seeing him he started having worsening pain, worsening pain going down the leg. More pain in the groin as well. Waking him up at night. Even difficult to ambulate at this time.   X-rays were ordered and independently visualized by me. Lumbar x-rays show mild to moderate osteophytic changes from L2-L4 X-ray of right hip shows very minimal degenerative changes.  Past Medical History:  Diagnosis Date  . Allergic rhinitis   . Asthma   . Barrett's esophagus   . GERD (gastroesophageal reflux disease)   . HTN (hypertension)   . Hyperlipidemia   . Nephrolithiasis    Past Surgical History:  Procedure Laterality Date  . ESOPHAGOGASTRODUODENOSCOPY  03/20/2006  . TONSILLECTOMY    . TRANSTHORACIC ECHOCARDIOGRAM  01/05/1998   Social History   Social History  . Marital status: Single    Spouse name: N/A  . Number of children: N/A  . Years of education: N/A   Occupational History  . 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 Smoking status: Never Smoker  . Smokeless tobacco: Never Used  . Alcohol use No  . Drug use: No  . Sexual activity: Not Currently   Other Topics Concern  . None   Social  History Narrative   Single   Regular Exercise-yes   Occupation: Financial controller for [employer]            Allergies  Allergen Reactions  . Albuterol     REACTION: "feels like drowning"  . Ciprofloxacin     REACTION: rash  . Clarithromycin     REACTION: swelling  . Colesevelam     REACTION: constip  . Enalapril Maleate     REACTION: Erectile dysfunction  . Esomeprazole Magnesium     REACTION: epigastric pain  . Niacin     REACTION: diarrhea  . Penicillins     REACTION: rash  . Pravastatin Sodium     REACTION: aching, listless  . Rosuvastatin     REACTION: achy   Family History  Problem Relation Age of Onset  . Colon polyps Mother   . Heart disease Mother   . Hypertension      Past medical history, social, surgical and family history all reviewed in electronic medical record.  No pertanent information unless stated regarding to the chief complaint.   Review of Systems: No headache, visual changes, nausea, vomiting, diarrhea, constipation, dizziness, abdominal pain, skin rash, fevers, chills, night sweats, weight loss, swollen lymph nodes, body aches, joint swelling, muscle aches, chest pain, shortness of breath, mood changes.   Objective  Blood pressure 110/72, pulse (!) 118, SpO2 95 %.  General: No apparent distress alert and oriented x3 mood  and affect normal, dressed appropriately.  HEENT: Pupils equal, extraocular movements intact  Respiratory: Patient's speak in full sentences and does not appear short of breath  Cardiovascular: No lower extremity edema, non tender, no erythema  Skin: Warm dry intact with no signs of infection or rash on extremities or on axial skeleton.  Abdomen: Soft nontender  Neuro: Cranial nerves II through XII are intact, neurovascularly intact in all extremities with 2+ DTRs and 2+ pulses.  Lymph: No lymphadenopathy of posterior or anterior cervical chain or axillae bilaterally.  Gait Severely antalgic gait  MSK:  Non tender  with full range of motion and good stability and symmetric strength and tone of shoulders, elbows, wrist, knee and ankles bilaterally.  Hip: Right ROM Continued to have atrophy on the right side IR: 5 Deg with severe pain t ER: 25 Deg, Flexion: 100 Deg, Extension: 80 Deg, Abduction: 25 Deg, Adduction: 15 Deg Strength 3/5 overall compared to 5 out of 5 on the contralateral side this is significantly worse than previous exam Pelvic alignment unremarkable to inspection and palpation. Standing hip rotation and gait without trendelenburg sign / unsteadiness. Greater trochanter without tenderness to palpation. No tenderness over piriformis and greater trochanter. No SI joint tenderness and normal minimal SI movement.  Positive straight leg test on the right side still present.   Impression and Recommendations:     This case required medical decision making of moderate complexity.      Note: This dictation was prepared with Dragon dictation along with smaller phrase technology. Any transcriptional errors that result from this process are unintentional.

## 2015-12-19 ENCOUNTER — Encounter (HOSPITAL_COMMUNITY): Payer: Self-pay

## 2015-12-19 ENCOUNTER — Emergency Department (HOSPITAL_COMMUNITY): Payer: Managed Care, Other (non HMO)

## 2015-12-19 ENCOUNTER — Emergency Department (HOSPITAL_COMMUNITY)
Admission: EM | Admit: 2015-12-19 | Discharge: 2015-12-19 | Disposition: A | Discharge status: Home / Self Care | Payer: Managed Care, Other (non HMO) | Attending: Emergency Medicine | Admitting: Emergency Medicine

## 2015-12-19 DIAGNOSIS — Y929 Unspecified place or not applicable: Secondary | ICD-10-CM | POA: Insufficient documentation

## 2015-12-19 DIAGNOSIS — Y999 Unspecified external cause status: Secondary | ICD-10-CM | POA: Diagnosis not present

## 2015-12-19 DIAGNOSIS — Y939 Activity, unspecified: Secondary | ICD-10-CM | POA: Insufficient documentation

## 2015-12-19 DIAGNOSIS — M25551 Pain in right hip: Secondary | ICD-10-CM | POA: Diagnosis present

## 2015-12-19 DIAGNOSIS — X500XXA Overexertion from strenuous movement or load, initial encounter: Secondary | ICD-10-CM | POA: Insufficient documentation

## 2015-12-19 DIAGNOSIS — Z79899 Other long term (current) drug therapy: Secondary | ICD-10-CM | POA: Insufficient documentation

## 2015-12-19 DIAGNOSIS — I10 Essential (primary) hypertension: Secondary | ICD-10-CM | POA: Insufficient documentation

## 2015-12-19 DIAGNOSIS — M25559 Pain in unspecified hip: Secondary | ICD-10-CM

## 2015-12-19 DIAGNOSIS — J45909 Unspecified asthma, uncomplicated: Secondary | ICD-10-CM | POA: Insufficient documentation

## 2015-12-19 MED ORDER — LORAZEPAM 2 MG/ML IJ SOLN: 1.0000 mg | INTRAMUSCULAR | Status: DC | PRN

## 2015-12-19 NOTE — ED Notes (Signed)
Patient transported to MRI 

## 2015-12-19 NOTE — Discharge Instructions (Signed)

## 2015-12-19 NOTE — ED Provider Notes (Signed)
[facility] DEPT Provider Note   CSN: [id number] Arrival date & time: 12/19/15  1046     History   Chief Complaint Chief Complaint  Patient presents with  . Leg Pain  . Hip Pain    HPI Corey [name] is a 65 y.o. male.  HPI  The pt has had hip pain on the R for the last 2 months since picking up a  300 lb statue - he has pain with even the most minor movements - he has had w/u with PCP and ortho with Dr. Naaman Plummer, but no acute findigns - MRI's ordered but pt has had increased pain and can't wait for outpt studies -  He notes that the pain is particularly bad with any flexion or rotation of the hip, he cannot get into a car and drive because of the severe pain. This is all totally new for him. He also has pain that radiates from his anterior thigh down through his calf into the foot. There is pain with range of motion of the foot at the ankle. He denies numbness or weakness of the leg.  Past Medical History:  Diagnosis Date  . Allergic rhinitis   . Asthma   . Barrett's esophagus   . GERD (gastroesophageal reflux disease)   . HTN (hypertension)   . Hyperlipidemia   . Nephrolithiasis     Patient Active Problem List   Diagnosis Date Noted  . Osteochondral defect of condyle of femur 12/21/2015  . Lumbar radiculopathy 12/12/2015  . Leg pain, right 11/21/2015  . BPH (benign prostatic hyperplasia) 04/12/2012  . Erectile dysfunction 03/25/2012  . Trochanteric bursitis of left hip 03/22/2012  . Well adult exam 03/13/2011  . Urticaria, chronic 10/03/2010  . Fatigue 07/19/2010  . LIVER FUNCTION TESTS, ABNORMAL, HX OF 01/04/2010  . BRONCHITIS, ACUTE 03/20/2009  . GASTROENTERITIS 07/14/2008  . BARRETTS ESOPHAGUS 01/29/2007  . NEPHROLITHIASIS, HX OF 01/29/2007  . HYPERLIPIDEMIA 01/19/2007  . Essential hypertension 01/19/2007  . ALLERGIC RHINITIS 01/19/2007  . ASTHMA 01/19/2007  . GERD 01/19/2007    Past Surgical History:  Procedure Laterality Date  .  ESOPHAGOGASTRODUODENOSCOPY  03/20/2006  . TONSILLECTOMY    . TRANSTHORACIC ECHOCARDIOGRAM  01/05/1998       Home Medications    Prior to Admission medications   Medication Sig Start Date End Date Taking? Authorizing Provider  alendronate (FOSAMAX) 70 MG tablet Take 1 tablet (70 mg total) by mouth every 7 (seven) days. Take with a full glass of water on an empty stomach. 12/21/15   Lyndal Pulley, DO  amLODipine-olmesartan (AZOR) 5-40 MG tablet Take 0.5 tablets by mouth daily. 04/06/15   Janith Lima, MD  cetirizine (ZYRTEC) 10 MG tablet Take 10 mg by mouth 2 (two) times daily.     Historical Provider, MD  Cholecalciferol (VITAMIN D3) 1000 UNITS tablet Take 1,000 Units by mouth daily.      Historical Provider, MD  cyclobenzaprine (FLEXERIL) 5 MG tablet Take 1 tablet (5 mg total) by mouth 3 (three) times daily as needed for muscle spasms. 11/02/15   Evie Lacks Plotnikov, MD  diazepam (VALIUM) 5 MG tablet One tab by mouth, 2 hours before procedure. 12/12/15   Lyndal Pulley, DO  diclofenac sodium (VOLTAREN) 1 % GEL Apply 4 g topically 3 (three) times daily as needed. 10/22/15   Flossie Buffy, NP  HYDROcodone-acetaminophen (NORCO/VICODIN) 5-325 MG tablet Take 1 tablet by mouth every 6 (six) hours as needed for moderate pain. 11/21/15 11/20/16  Cassandria Anger, MD  nortriptyline (PAMELOR) 10 MG capsule Take 2 capsules (20 mg total) by mouth at bedtime. 12/12/15   Lyndal Pulley, DO  pantoprazole (PROTONIX) 40 MG tablet Take 1 tablet (40 mg total) by mouth daily. 03/30/15   Evie Lacks Plotnikov, MD  Pramoxine-HC (PRAMOSONE) 1-2.5 % OINT Use bid 03/30/15   Cassandria Anger, MD  predniSONE (DELTASONE) 50 MG tablet Take 1 tablet (50 mg total) by mouth daily. 12/21/15   Lyndal Pulley, DO  promethazine (PHENERGAN) 12.5 MG tablet Take 2 tablets (25 mg total) by mouth every 8 (eight) hours as needed for nausea. 02/16/12   Jearld Fenton, NP  tadalafil (CIALIS) 5 MG tablet TAKE 1 TABLET IN THE  MORNING for BPH 11/21/15   Cassandria Anger, MD  tobramycin-dexamethasone [facility]) ophthalmic solution daily. 03/02/15   Historical Provider, MD  traMADol (ULTRAM) 50 MG tablet Take 1 tablet (50 mg total) by mouth every 8 (eight) hours as needed. 12/17/15   Lyndal Pulley, DO  Vitamin D, Ergocalciferol, (DRISDOL) 50000 units CAPS capsule Take 1 capsule (50,000 Units total) by mouth every 7 (seven) days. 12/05/15   Lyndal Pulley, DO    Family History Family History  Problem Relation Age of Onset  . Colon polyps Mother   . Heart disease Mother   . Hypertension      Social History Social History  Substance Use Topics  . Smoking status: Never Smoker  . Smokeless tobacco: Never Used  . Alcohol use No     Allergies   Albuterol; Ciprofloxacin; Clarithromycin; Colesevelam; Enalapril maleate; Esomeprazole magnesium; Niacin; Penicillins; Pravastatin sodium; and Rosuvastatin   Review of Systems Review of Systems  All other systems reviewed and are negative.    Physical Exam Updated Vital Signs BP 152/99   Pulse 110   Temp 97.9 F (36.6 C) (Oral)   Resp 18   SpO2 95%   Physical Exam  Constitutional: He appears well-developed and well-nourished. No distress.  HENT:  Head: Normocephalic and atraumatic.  Mouth/Throat: Oropharynx is clear and moist. No oropharyngeal exudate.  Eyes: Conjunctivae and EOM are normal. Pupils are equal, round, and reactive to light. Right eye exhibits no discharge. Left eye exhibits no discharge. No scleral icterus.  Neck: Normal range of motion. Neck supple. No JVD present. No thyromegaly present.  Cardiovascular: Normal rate, regular rhythm, normal heart sounds and intact distal pulses.  Exam reveals no gallop and no friction rub.   No murmur heard. Pulmonary/Chest: Effort normal and breath sounds normal. No respiratory distress. He has no wheezes. He has no rales.  Abdominal: Soft. Bowel sounds are normal. He exhibits no distension and no mass.  There is no tenderness.  Musculoskeletal: Normal range of motion. He exhibits tenderness. He exhibits no edema.  The patient has pain with even the smallest range of motion of the hip whether it is flexion and extension internal or external rotation. He is unable to bend at the hip without significant pain. He also has pain with any movement of the knee or the ankle. He seems to be very tense in his muscles, he has normal strength and sensation in all 4  Lymphadenopathy:    He has no cervical adenopathy.  Neurological: He is alert. Coordination normal.  Skin: Skin is warm and dry. No rash noted. No erythema.  Psychiatric: He has a normal mood and affect. His behavior is normal.  Nursing note and vitals reviewed.    ED Treatments / Results  Labs (all labs ordered are listed, but only abnormal results are displayed) Labs Reviewed - No data to display   Radiology Mr Lumbar Spine Wo Contrast  Result Date: 12/19/2015 CLINICAL DATA:  Low back pain right leg pain for 1 month. Injury lifting a heavy stand she. EXAM: MRI LUMBAR SPINE WITHOUT CONTRAST TECHNIQUE: Multiplanar, multisequence MR imaging of the lumbar spine was performed. No intravenous contrast was administered. COMPARISON:  12/05/2015 FINDINGS: Segmentation: The lowest lumbar type non-rib-bearing vertebra is labeled as L5. Alignment:  No vertebral subluxation is observed. Vertebrae: 1.8 cm hemangioma in the L5 vertebral body. 0.7 cm hemangioma in the L2 vertebral body. No significant vertebral marrow edema is identified. Conus medullaris: Extends to the lower L2 level and appears normal aside from being while in position. No tethering mass is identified. Paraspinal and other soft tissues: A fluid signal intensity lesion of the left kidney is partially characterized on today's exam. This is statistically likely to be a cyst but not technically specific. Disc levels: L1-2: Unremarkable. L2-3:  No impingement.  Mild disc bulge. L3-4:  Mild  bilateral foraminal stenosis due to diffuse disc bulge. L4-5: Mild bilateral foraminal stenosis and mild left subarticular lateral recess stenosis due to left paracentral disc protrusion, disc bulge, and facet arthropathy. L5-S1: Mild to moderate bilateral foraminal stenosis due to disc bulge and facet arthropathy. IMPRESSION: 1. Lumbar spondylosis and degenerative disc disease, causing mild to moderate impingement at L5-S1 and mild impingement at L3-4 and L4-5, as detailed above. 2. Low-lying conus at the lower L2 level, without a tethering mass identified. Electronically Signed   By: Van Clines M.D.   On: 12/19/2015 15:23   Mr Hip Right Wo Contrast  Result Date: 12/19/2015 CLINICAL DATA:  Low back pain right leg pain for 1 month. Injury lifting a heavy object. EXAM: MR OF THE RIGHT HIP WITHOUT CONTRAST TECHNIQUE: Multiplanar, multisequence MR imaging was performed. No intravenous contrast was administered. COMPARISON:  Multiple exams, including 12/05/2015 radiographs, and CT pelvis from 03/03/2008 FINDINGS: Bones: 1.1 cm osteochondral lesion posteriorly along the right femoral head, image 19/6, with low-level adjacent edema. I do not see overt fragmentation. There is likely some thinning of the overlying articular cartilage although in general there is fairly striking chondral thinning in the right hip. No other bony abnormality identified. Articular cartilage and labrum Articular cartilage: Moderate to prominent right and moderate left degenerative chondral thinning. Labrum: I do not see a well-defined labral tear despite the degenerative spurring along the right acetabulum. Joint or bursal effusion Joint effusion:  Small right hip joint effusion is present. Bursae:  No regional bursitis Muscles and tendons Muscles and tendons:  Unremarkable Other findings Miscellaneous:  Degenerative disc disease at the L4-5 level. IMPRESSION: 1. 11 mm osteochondral lesion posteriorly along the right femoral head,  with overlying chondral focal thinning but without overt bony fragmentation. There is a right hip joint effusion, and asymmetric moderate to prominent right degenerative chondral thinning. 2. Degenerative disc disease at L4-5. 3. Spurring of the right acetabulum. Electronically Signed   By: Van Clines M.D.   On: 12/19/2015 14:31    Procedures Procedures (including critical care time)  Medications Ordered in ED Medications - No data to display   Initial Impression / Assessment and Plan / ED Course  I have reviewed the triage vital signs and the nursing notes.  Pertinent labs & imaging results that were available during my care of the patient were reviewed by me and considered in my medical decision  making (see chart for details).  Clinical Course    Overall the patient is well-appearing but does have significant pain in his right hip which could be from a muscle spasm of the internal pelvic muscles, this could also be from flexors or internal or external rotating muscles.  At this time I feel prudent that an emergent MRI would be needed to further evaluate the patient's pain as he is having intractable and intolerable pain. He declines opiate pain medications.  MRI results reviewed with the patient, doubt infection causing the effusion given the history of prolonged pain,  Absence of fever, close follow-up outpatient, patient agreement  Final Clinical Impressions(s) / ED Diagnoses   Final diagnoses:  Hip pain  Right hip pain    New Prescriptions Discharge Medication List as of 12/19/2015  3:58 PM       Noemi Chapel, MD 12/21/15 1102

## 2015-12-19 NOTE — ED Triage Notes (Signed)
Pt with hip and leg pain x 2 months.  Pt told he needs an MRI.  States his insurance will not cover outpatient.  States meds given by MD are not working.

## 2015-12-19 NOTE — ED Notes (Signed)
MD at bedside. 

## 2015-12-19 NOTE — ED Notes (Signed)
Pt refused medication at this time. He states he will call if he thinks he needs it. He states he does not feel that he would be too anxious for the MRI at this time.

## 2015-12-20 NOTE — Progress Notes (Signed)
[facility] [address] [place], [place] [address] Phone: [phone] Subjective:    CC: Right hip and leg pain f/u   [id number]  Corey [name] is a 65 y.o. male coming in with complaint of right hip and leg pain. Patient's on primary care provider and was given an injection for a greater trochanteric bursitis. An states that the injection only last approximately 1 week.   Patient was seen by me and was having worsening radiculopathy. Patient was also having some mild weakness. Patient states that the pain and weakness seemed to get worse and went to the emergency room. In the emergency room patient did get an MRI of the lumbar spine as well as the hip.  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 Patient was found to have degenerative disc disease of the lumbar spine as well as mild to moderate impingement of the L5 and S1 foraminal stenosis  MRI of the hip showed patient did have an 11 mm osteochondral lesion posteriorly along the right femoral head without bony fragmentation that there is a large hip effusion and moderate to prominent right degenerative chondral thinning.  Continues to have significant amount pain. Keeping him from daily activities even. Feels like he is having more difficulty over the course of time. Just wants the pain to be resolved.     Past Medical History:  Diagnosis Date  . Allergic rhinitis   . Asthma   . Barrett's esophagus   . GERD (gastroesophageal reflux disease)   . HTN (hypertension)   . Hyperlipidemia   . Nephrolithiasis    Past Surgical History:  Procedure Laterality Date  . ESOPHAGOGASTRODUODENOSCOPY  03/20/2006  . TONSILLECTOMY    . TRANSTHORACIC ECHOCARDIOGRAM  01/05/1998   Social History   Social History  . Marital status: Single    Spouse name: N/A  . Number of children: N/A  . Years of education: N/A     Occupational History  . 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 Smoking status: Never Smoker  . Smokeless tobacco: Never Used  . Alcohol use No  . Drug use: No  . Sexual activity: Not Currently   Other Topics Concern  . None   Social History Narrative   Single   Regular Exercise-yes   Occupation: Financial controller for [employer]            Allergies  Allergen Reactions  . Albuterol     REACTION: "feels like drowning"  . Ciprofloxacin     REACTION: rash  . Clarithromycin     REACTION: swelling  . Colesevelam     REACTION: constip  . Enalapril Maleate     REACTION: Erectile dysfunction  . Esomeprazole Magnesium     REACTION: epigastric pain  . Niacin     REACTION: diarrhea  . Penicillins     REACTION: rash  . Pravastatin Sodium     REACTION: aching, listless  . Rosuvastatin     REACTION: achy   Family History  Problem Relation Age of Onset  . Colon polyps Mother   . Heart disease Mother   . Hypertension      Past medical history, social, surgical and family history all reviewed in electronic medical record.  No pertanent information unless stated regarding to the chief complaint.   Review of Systems: No  headache, visual changes, nausea, vomiting, diarrhea, constipation, dizziness, abdominal pain, skin rash, fevers, chills, night sweats, weight loss, swollen lymph nodes, body aches, joint swelling, muscle aches, chest pain, shortness of breath, mood changes.   Objective  Blood pressure 122/70.  General: No apparent distress alert and oriented x3 mood and affect normal, dressed appropriately.  HEENT: Pupils equal, extraocular movements intact  Respiratory: Patient's speak in full sentences and does not appear short of breath  Cardiovascular: No lower extremity edema, non tender, no erythema  Skin: Warm dry intact with no signs of infection or rash on extremities or on axial skeleton.  Abdomen: Soft nontender   Neuro: Cranial nerves II through XII are intact, neurovascularly intact in all extremities with 2+ DTRs and 2+ pulses.  Lymph: No lymphadenopathy of posterior or anterior cervical chain or axillae bilaterally.  Gait Severely antalgic gait  MSK:  Non tender with full range of motion and good stability and symmetric strength and tone of shoulders, elbows, wrist, knee and ankles bilaterally.  Hip: Right ROM Continued to have atrophy on the right side IR: 5 Deg with severe pain and worsening ER: 25 Deg, Flexion: 100 Deg, Extension: 80 Deg, Abduction: 25 Deg, Adduction: 15 Deg Strength 3/5 overall compared to 5 out of 5 on the contralateral side this is significantly worse than previous exam Pelvic alignment unremarkable to inspection and palpation. Standing hip rotation and gait without trendelenburg sign / unsteadiness. Greater trochanter without tenderness to palpation. No tenderness over piriformis and greater trochanter. No SI joint tenderness and normal minimal SI movement.  Positive straight leg test on the right side still present as well.    Impression and Recommendations:     This case required medical decision making of moderate complexity.      Note: This dictation was prepared with Dragon dictation along with smaller phrase technology. Any transcriptional errors that result from this process are unintentional.

## 2015-12-21 ENCOUNTER — Encounter: Payer: Self-pay | Admitting: Family Medicine

## 2015-12-21 ENCOUNTER — Ambulatory Visit (INDEPENDENT_AMBULATORY_CARE_PROVIDER_SITE_OTHER): Payer: Managed Care, Other (non HMO) | Admitting: Family Medicine

## 2015-12-21 DIAGNOSIS — M958 Other specified acquired deformities of musculoskeletal system: Secondary | ICD-10-CM | POA: Insufficient documentation

## 2015-12-21 MED ORDER — ALENDRONATE SODIUM 70 MG PO TABS: 70.0000 mg | ORAL_TABLET | ORAL | 11 refills | Status: DC

## 2015-12-21 MED ORDER — PREDNISONE 50 MG PO TABS: 50.0000 mg | ORAL_TABLET | Freq: Every day | ORAL | 0 refills | Status: DC

## 2015-12-21 NOTE — Patient Instructions (Addendum)
Good to see you  Fosamax weekly for next 8 weeks OCntinue the vitamin D Prednisone daily for 5 days  crutches if you can for 2 weeks See me again in 2-3 weeks.

## 2015-12-21 NOTE — Assessment & Plan Note (Signed)
Worsening. Patient knows that likely this will need a hip replacement in the long run. Patient does not want it until the first year. 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 Continue the once weekly vitamin D, discussed icing regimen, given prednisone to help with the effusion. Discuss if worsening symptoms we can consider aspiration. Follow-up again in 2 weeks.  Discussed symptoms and when to go to ED if needed.

## 2016-01-03 NOTE — Progress Notes (Signed)
[facility] [place] [place], [place] [address] Phone: [phone] Subjective:    CC: Right hip and leg pain f/u   [id number]  Corey [name] is a 65 y.o. male coming in with complaint of right hip and leg pain.   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 Patient did not want to have any type of surgical intervention. Was put on prednisone, vitamin D once weekly, Fosamax and was to be nonweightbearing for 2 weeks on this leg. Patient states He is feeling better at this time. Feels the prednisone did help with the inflammation. Is able to bear weight at this time. Can do most daily activities. Still some pain when he moves a certain direction but no increase in instability. Can sleep more comfortably at night as well. No side effects to any other medications at this time. No wants to wait for replacement until the first a year.   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 Patient was found to have degenerative disc disease of the lumbar spine as well as mild to moderate impingement of the L5 and S1 foraminal stenosis  MRI of the hip showed patient did have an 11 mm osteochondral lesion posteriorly along the right femoral head without bony fragmentation that there is a large hip effusion and moderate to prominent right degenerative chondral thinning.   Past Medical History:  Diagnosis Date  . Allergic rhinitis   . Asthma   . Barrett's esophagus   . GERD (gastroesophageal reflux disease)   . HTN (hypertension)   . Hyperlipidemia   . Nephrolithiasis    Past Surgical History:  Procedure Laterality Date  . ESOPHAGOGASTRODUODENOSCOPY  03/20/2006  . TONSILLECTOMY    . TRANSTHORACIC ECHOCARDIOGRAM  01/05/1998   Social History   Social History  . Marital  status: Single    Spouse name: N/A  . Number of children: N/A  . Years of education: N/A   Occupational History  . 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 Smoking status: Never Smoker  . Smokeless tobacco: Never Used  . Alcohol use No  . Drug use: No  . Sexual activity: Not Currently   Other Topics Concern  . None   Social History Narrative   Single   Regular Exercise-yes   Occupation: Financial controller for [employer]            Allergies  Allergen Reactions  . Albuterol     REACTION: "feels like drowning"  . Ciprofloxacin     REACTION: rash  . Clarithromycin     REACTION: swelling  . Colesevelam     REACTION: constip  . Enalapril Maleate     REACTION: Erectile dysfunction  . Esomeprazole Magnesium     REACTION: epigastric pain  . Niacin     REACTION: diarrhea  . Penicillins     REACTION: rash  . Pravastatin Sodium     REACTION: aching, listless  . Rosuvastatin     REACTION: achy   Family History  Problem Relation Age of Onset  . Colon polyps Mother   . Heart disease Mother   . Hypertension      Past medical history, social, surgical and family history all reviewed in electronic medical  record.  No pertanent information unless stated regarding to the chief complaint.   Review of Systems: No headache, visual changes, nausea, vomiting, diarrhea, constipation, dizziness, abdominal pain, skin rash, fevers, chills, night sweats, weight loss, swollen lymph nodes, body aches, joint swelling, muscle aches, chest pain, shortness of breath, mood changes.   Objective  Blood pressure 124/72, pulse (!) 120, height 6\' 2"  (1.88 m), weight 222 lb (100.7 kg), SpO2 97 %.  Systems examined below as of 01/04/16 General: NAD A&O x3 mood, affect normal  HEENT: Pupils equal, extraocular movements intact no nystagmus Respiratory: not short of breath at rest or with speaking Cardiovascular: No lower extremity edema, non tender Skin:  Warm dry intact with no signs of infection or rash on extremities or on axial skeleton. Abdomen: Soft nontender, no masses Neuro: Cranial nerves  intact, neurovascularly intact in all extremities with 2+ DTRs and 2+ pulses. Lymph: No lymphadenopathy appreciated today  Gait continued antalgic gait  MSK:  Non tender with full range of motion and good stability and symmetric strength and tone of shoulders, elbows, wrist, knee and ankles bilaterally.  Hip: Right ROM atrophy on the right side still present IR: 5 Degrees ER: 25 Deg, Flexion: 100 Deg, Extension: 80 Deg, Abduction: 25 Deg, Adduction: 15 Deg Strength 3+/5 overall compared to 5 out of 5 on the contralateral side mild improvement from previous exam Pelvic alignment unremarkable to inspection and palpation. Standing hip rotation and gait without trendelenburg sign / unsteadiness. Greater trochanter without tenderness to palpation. No tenderness over piriformis and greater trochanter. No SI joint tenderness and normal minimal SI movement.     Impression and Recommendations:     This case required medical decision making of moderate complexity.      Note: This dictation was prepared with Dragon dictation along with smaller phrase technology. Any transcriptional errors that result from this process are unintentional.

## 2016-01-04 ENCOUNTER — Ambulatory Visit (INDEPENDENT_AMBULATORY_CARE_PROVIDER_SITE_OTHER): Payer: Managed Care, Other (non HMO) | Admitting: Family Medicine

## 2016-01-04 ENCOUNTER — Encounter: Payer: Self-pay | Admitting: Family Medicine

## 2016-01-04 VITALS — BP 124/72 | HR 120 | Ht 74.0 in | Wt 222.0 lb

## 2016-01-04 DIAGNOSIS — M1611 Unilateral primary osteoarthritis, right hip: Secondary | ICD-10-CM

## 2016-01-04 DIAGNOSIS — M958 Other specified acquired deformities of musculoskeletal system: Secondary | ICD-10-CM

## 2016-01-04 MED ORDER — MELOXICAM 15 MG PO TABS: 15.0000 mg | ORAL_TABLET | Freq: Every day | ORAL | 0 refills | Status: DC

## 2016-01-04 NOTE — Patient Instructions (Signed)
Great to see you  Corey [name] is your friend  We will get you in to see Corey [name].  Meloxicam daily for 5 days whenever the pain gets worse See me again in 4 weeks to make sure everything is moving forward.

## 2016-01-04 NOTE — Assessment & Plan Note (Signed)
Discussed with patient again at great length. We discussed on prednisone would not be able to be continued but was given oral anti-inflammatories. We discussed if any worsening symptoms again though is consider aspiration of the hip. We discussed that the only thing that will be curative would be a hip replacement. Past medical history and his family is consistent with 2 of his sisters having also the right hip replaced in their 71s to 30s. Patient encouraged to continue to only weight-bear as tolerated and not do any excessive amount of activity. Patient will come back again in 4 weeks for further evaluation.  Spent  25 minutes with patient face-to-face and had greater than 50% of counseling including as described above in assessment and plan.

## 2016-01-15 ENCOUNTER — Telehealth: Payer: Self-pay | Admitting: Family Medicine

## 2016-01-15 MED ORDER — CYCLOBENZAPRINE HCL 5 MG PO TABS: 5.0000 mg | ORAL_TABLET | Freq: Three times a day (TID) | ORAL | 0 refills | Status: DC | PRN

## 2016-01-15 MED ORDER — NORTRIPTYLINE HCL 10 MG PO CAPS: 20.0000 mg | ORAL_CAPSULE | Freq: Every day | ORAL | 1 refills | Status: DC

## 2016-01-15 NOTE — Telephone Encounter (Signed)
Discussed with pt. He also wanted to know what could he take to help him sleep.

## 2016-01-15 NOTE — Telephone Encounter (Signed)
Discussed with pt

## 2016-01-15 NOTE — Telephone Encounter (Signed)
Refilled flexeril and make sure taking nortriptyline.

## 2016-01-15 NOTE — Telephone Encounter (Signed)
Patient states he is in a lot of pain.  States he can't take tramadol bc of the side effects.  States that he can not take NSAIDS.  Also states that Dr. Tamala Julian said something about taking a tylenol arthritis medication.  Patient would like to know if this would be a good route for him to go.

## 2016-01-15 NOTE — Telephone Encounter (Signed)
Yes If not taking the pain medicine then would consider tylenol 650mg  3 times daily.

## 2016-02-01 ENCOUNTER — Ambulatory Visit: Payer: Managed Care, Other (non HMO) | Admitting: Family Medicine

## 2016-03-03 ENCOUNTER — Telehealth: Payer: Self-pay | Admitting: *Deleted

## 2016-03-03 MED ORDER — ALENDRONATE SODIUM 70 MG PO TABS: 70.0000 mg | ORAL_TABLET | ORAL | 0 refills | Status: DC

## 2016-03-03 MED ORDER — AMLODIPINE-OLMESARTAN 5-40 MG PO TABS: 0.5000 | ORAL_TABLET | Freq: Every day | ORAL | 0 refills | Status: DC

## 2016-03-03 MED ORDER — TADALAFIL 5 MG PO TABS: ORAL_TABLET | ORAL | 0 refills | Status: DC

## 2016-03-03 NOTE — Telephone Encounter (Signed)
Rec'd call pt states his mail service has change to [employer]. Needing Azor, fosamax, and cialis sent to mail service. Inform pt he is due for CPX in February will go ahead and send but w/o refill until he come in for CPX...Johny Chess

## 2016-03-04 ENCOUNTER — Other Ambulatory Visit: Payer: Self-pay | Admitting: General Practice

## 2016-03-04 MED ORDER — PANTOPRAZOLE SODIUM 40 MG PO TBEC: 40.0000 mg | DELAYED_RELEASE_TABLET | Freq: Every day | ORAL | 0 refills | Status: DC

## 2016-03-14 ENCOUNTER — Telehealth: Payer: Self-pay | Admitting: *Deleted

## 2016-03-14 NOTE — Telephone Encounter (Signed)
Left msg on triage stating needing a PA on Cialis 5 mg for BPH. Can call [phone] to get PA started w/ 90 day supply...Corey [name]

## 2016-03-17 NOTE — Telephone Encounter (Signed)
Pt called again request PA status for his Cialis...Corey [name]

## 2016-03-17 NOTE — Telephone Encounter (Addendum)
PA approved for cialis. Good for 36 months. The quantity allowed is # 30 for a 30 day supply. Approval code [id number]. Over ride for pharmacy. Customer care [phone]. I called to have the 90 day supply over ridden to a 30 day supply. Cannot be filled until 03/21/15. Please send through on that day.

## 2016-03-17 NOTE — Telephone Encounter (Signed)
Started PA w/cover my meds. PA was sent to [facility]. Waiting to hear back from insurance for approval status...Corey [name]

## 2016-03-18 NOTE — Telephone Encounter (Signed)
Rec'd PA back med has been approved for dates 03/17/16-03/18/2019. Called pt inform him PA was approved...Corey [name]

## 2016-03-25 ENCOUNTER — Ambulatory Visit (INDEPENDENT_AMBULATORY_CARE_PROVIDER_SITE_OTHER): Payer: Managed Care, Other (non HMO) | Admitting: Internal Medicine

## 2016-03-25 ENCOUNTER — Encounter: Payer: Self-pay | Admitting: Internal Medicine

## 2016-03-25 VITALS — BP 118/62 | HR 99 | Temp 98.0°F | Ht 74.0 in | Wt 227.0 lb

## 2016-03-25 DIAGNOSIS — Z0000 Encounter for general adult medical examination without abnormal findings: Secondary | ICD-10-CM | POA: Diagnosis not present

## 2016-03-25 DIAGNOSIS — I10 Essential (primary) hypertension: Secondary | ICD-10-CM | POA: Diagnosis not present

## 2016-03-25 MED ORDER — PRAMOXINE-HC 1-2.5 % EX OINT: TOPICAL_OINTMENT | CUTANEOUS | 4 refills | Status: DC

## 2016-03-25 MED ORDER — PANTOPRAZOLE SODIUM 40 MG PO TBEC
40.0000 mg | DELAYED_RELEASE_TABLET | Freq: Every day | ORAL | 3 refills | Status: DC
Start: 2016-03-25 — End: 2016-03-25

## 2016-03-25 MED ORDER — AMLODIPINE-OLMESARTAN 5-40 MG PO TABS: 0.5000 | ORAL_TABLET | Freq: Every day | ORAL | 3 refills | Status: DC

## 2016-03-25 MED ORDER — ALENDRONATE SODIUM 70 MG PO TABS: 70.0000 mg | ORAL_TABLET | ORAL | 3 refills | Status: DC

## 2016-03-25 MED ORDER — TADALAFIL 5 MG PO TABS: ORAL_TABLET | ORAL | 1 refills | Status: DC

## 2016-03-25 MED ORDER — PANTOPRAZOLE SODIUM 40 MG PO TBEC: 40.0000 mg | DELAYED_RELEASE_TABLET | Freq: Every day | ORAL | 3 refills | Status: DC

## 2016-03-25 MED ORDER — TADALAFIL 5 MG PO TABS: ORAL_TABLET | ORAL | 2 refills | Status: DC

## 2016-03-25 NOTE — Progress Notes (Signed)
Pre visit review using our clinic review tool, if applicable. No additional management support is needed unless otherwise documented below in the visit note. 

## 2016-03-25 NOTE — Assessment & Plan Note (Signed)
On Azor  

## 2016-03-25 NOTE — Progress Notes (Signed)
Subjective:  Patient ID: Corey [name], male    DOB: 1950-11-21  Age: 66 y.o. MRN: [id number]  CC: Annual Exam   HPI Corey [name] presents for a well exam. F/u the hip pain - THR in May 2018; OA, HTN   Outpatient Medications Prior to Visit  Medication Sig Dispense Refill  . cetirizine (ZYRTEC) 10 MG tablet Take 10 mg by mouth 2 (two) times daily.     . Cholecalciferol (VITAMIN D3) 1000 UNITS tablet Take 1,000 Units by mouth daily.      . cyclobenzaprine (FLEXERIL) 5 MG tablet Take 1 tablet (5 mg total) by mouth 3 (three) times daily as needed for muscle spasms. 90 tablet 0  . diazepam (VALIUM) 5 MG tablet One tab by mouth, 2 hours before procedure. 2 tablet 0  . diclofenac sodium (VOLTAREN) 1 % GEL Apply 4 g topically 3 (three) times daily as needed. 100 g 3  . HYDROcodone-acetaminophen (NORCO/VICODIN) 5-325 MG tablet Take 1 tablet by mouth every 6 (six) hours as needed for moderate pain. 60 tablet 0  . meloxicam (MOBIC) 15 MG tablet Take 1 tablet (15 mg total) by mouth daily. 30 tablet 0  . nortriptyline (PAMELOR) 10 MG capsule Take 2 capsules (20 mg total) by mouth at bedtime. 60 capsule 1  . Pramoxine-HC (PRAMOSONE) 1-2.5 % OINT Use bid 28.4 g 4  . promethazine (PHENERGAN) 12.5 MG tablet Take 2 tablets (25 mg total) by mouth every 8 (eight) hours as needed for nausea. 20 tablet 0  . tobramycin-dexamethasone (TOBRADEX) ophthalmic solution daily.    . traMADol (ULTRAM) 50 MG tablet Take 1 tablet (50 mg total) by mouth every 8 (eight) hours as needed. 30 tablet 0  . Vitamin D, Ergocalciferol, (DRISDOL) 50000 units CAPS capsule Take 1 capsule (50,000 Units total) by mouth every 7 (seven) days. 12 capsule 0  . alendronate (FOSAMAX) 70 MG tablet Take 1 tablet (70 mg total) by mouth every 7 (seven) days. Take with a full glass of water on an empty stomach. 12 tablet 0  . amLODipine-olmesartan (AZOR) 5-40 MG tablet Take 0.5 tablets by mouth daily. Yearly physical w/labs due in February  must see MD for refills 45 tablet 0  . pantoprazole (PROTONIX) 40 MG tablet Take 1 tablet (40 mg total) by mouth daily. 90 tablet 0  . tadalafil (CIALIS) 5 MG tablet Take 1 tablet in the am for BPH. Yearly physical due in February must see MD for refills 90 tablet 0   Facility-Administered Medications Prior to Visit  Medication Dose Route Frequency Provider Last Rate Last Dose  . lidocaine (PF) (XYLOCAINE) 1 % injection 1 mL  1 mL Other Once Flossie Buffy, NP      . methylPREDNISolone acetate (DEPO-MEDROL) injection 80 mg  80 mg Intra-articular Once Aleksei Plotnikov V, MD        ROS Review of Systems  Constitutional: Negative for appetite change, fatigue and unexpected weight change.  HENT: Negative for congestion, nosebleeds, sneezing, sore throat and trouble swallowing.   Eyes: Negative for itching and visual disturbance.  Respiratory: Negative for cough.   Cardiovascular: Negative for chest pain, palpitations and leg swelling.  Gastrointestinal: Negative for abdominal distention, blood in stool, diarrhea and nausea.  Genitourinary: Negative for frequency and hematuria.  Musculoskeletal: Positive for arthralgias and gait problem. Negative for back pain, joint swelling and neck pain.  Skin: Negative for rash.  Neurological: Negative for dizziness, tremors, speech difficulty and weakness.  Psychiatric/Behavioral: Negative for agitation,  dysphoric mood and sleep disturbance. The patient is not nervous/anxious.     Objective:  BP 118/62 (BP Location: Left Arm)   Pulse 99   Temp 98 F (36.7 C) (Oral)   Ht 6\' 2"  (1.88 m)   Wt 227 lb (103 kg)   BMI 29.15 kg/m   BP Readings from Last 3 Encounters:  03/25/16 118/62  01/04/16 124/72  12/21/15 122/70    Wt Readings from Last 3 Encounters:  03/25/16 227 lb (103 kg)  01/04/16 222 lb (100.7 kg)  12/12/15 221 lb (100.2 kg)    Physical Exam  Constitutional: He is oriented to person, place, and time. He appears well-developed.  No distress.  NAD  HENT:  Mouth/Throat: Oropharynx is clear and moist.  Eyes: Conjunctivae are normal. Pupils are equal, round, and reactive to light.  Neck: Normal range of motion. No JVD present. No thyromegaly present.  Cardiovascular: Normal rate, regular rhythm and intact distal pulses.  Exam reveals no gallop and no friction rub.   Murmur heard. Pulmonary/Chest: Effort normal and breath sounds normal. No respiratory distress. He has no wheezes. He has no rales. He exhibits no tenderness.  Abdominal: Soft. Bowel sounds are normal. He exhibits no distension and no mass. There is no tenderness. There is no rebound and no guarding.  Genitourinary: Rectum normal. Rectal exam shows guaiac negative stool.  Musculoskeletal: Normal range of motion. He exhibits tenderness. He exhibits no edema.  Lymphadenopathy:    He has no cervical adenopathy.  Neurological: He is alert and oriented to person, place, and time. He has normal reflexes. No cranial nerve deficit. He exhibits normal muscle tone. He displays a negative Romberg sign. Coordination and gait normal.  Skin: Skin is warm and dry. No rash noted.  Psychiatric: He has a normal mood and affect. His behavior is normal. Judgment and thought content normal.  prostate 1+  Procedure: EKG Indication: chest pain Impression: NSR. Rare PACs. No acute changes.   Lab Results  Component Value Date   WBC 7.8 11/21/2015   HGB 13.9 11/21/2015   HCT 40.6 11/21/2015   PLT 256.0 11/21/2015   GLUCOSE 160 (H) 11/21/2015   CHOL 239 (H) 05/05/2014   TRIG 164.0 (H) 05/05/2014   HDL 42.50 05/05/2014   LDLDIRECT 191.5 04/15/2013   LDLDIRECT 193.6 04/15/2013   LDLCALC 164 (H) 05/05/2014   ALT 56 (H) 05/05/2014   AST 34 05/05/2014   NA 138 11/21/2015   K 4.3 11/21/2015   CL 98 11/21/2015   CREATININE 1.05 11/21/2015   BUN 21 11/21/2015   CO2 33 (H) 11/21/2015   TSH 1.04 05/05/2014   PSA 0.95 05/05/2014   HGBA1C 5.9 05/16/2009    Mr Lumbar Spine  Wo Contrast  Result Date: 12/19/2015 CLINICAL DATA:  Low back pain right leg pain for 1 month. Injury lifting a heavy stand she. EXAM: MRI LUMBAR SPINE WITHOUT CONTRAST TECHNIQUE: Multiplanar, multisequence MR imaging of the lumbar spine was performed. No intravenous contrast was administered. COMPARISON:  12/05/2015 FINDINGS: Segmentation: The lowest lumbar type non-rib-bearing vertebra is labeled as L5. Alignment:  No vertebral subluxation is observed. Vertebrae: 1.8 cm hemangioma in the L5 vertebral body. 0.7 cm hemangioma in the L2 vertebral body. No significant vertebral marrow edema is identified. Conus medullaris: Extends to the lower L2 level and appears normal aside from being while in position. No tethering mass is identified. Paraspinal and other soft tissues: A fluid signal intensity lesion of the left kidney is partially characterized on today's  exam. This is statistically likely to be a cyst but not technically specific. Disc levels: L1-2: Unremarkable. L2-3:  No impingement.  Mild disc bulge. L3-4:  Mild bilateral foraminal stenosis due to diffuse disc bulge. L4-5: Mild bilateral foraminal stenosis and mild left subarticular lateral recess stenosis due to left paracentral disc protrusion, disc bulge, and facet arthropathy. L5-S1: Mild to moderate bilateral foraminal stenosis due to disc bulge and facet arthropathy. IMPRESSION: 1. Lumbar spondylosis and degenerative disc disease, causing mild to moderate impingement at L5-S1 and mild impingement at L3-4 and L4-5, as detailed above. 2. Low-lying conus at the lower L2 level, without a tethering mass identified. Electronically Signed   By: Van Clines M.D.   On: 12/19/2015 15:23   Mr Hip Right Wo Contrast  Result Date: 12/19/2015 CLINICAL DATA:  Low back pain right leg pain for 1 month. Injury lifting a heavy object. EXAM: MR OF THE RIGHT HIP WITHOUT CONTRAST TECHNIQUE: Multiplanar, multisequence MR imaging was performed. No intravenous  contrast was administered. COMPARISON:  Multiple exams, including 12/05/2015 radiographs, and CT pelvis from 03/03/2008 FINDINGS: Bones: 1.1 cm osteochondral lesion posteriorly along the right femoral head, image 19/6, with low-level adjacent edema. I do not see overt fragmentation. There is likely some thinning of the overlying articular cartilage although in general there is fairly striking chondral thinning in the right hip. No other bony abnormality identified. Articular cartilage and labrum Articular cartilage: Moderate to prominent right and moderate left degenerative chondral thinning. Labrum: I do not see a well-defined labral tear despite the degenerative spurring along the right acetabulum. Joint or bursal effusion Joint effusion:  Small right hip joint effusion is present. Bursae:  No regional bursitis Muscles and tendons Muscles and tendons:  Unremarkable Other findings Miscellaneous:  Degenerative disc disease at the L4-5 level. IMPRESSION: 1. 11 mm osteochondral lesion posteriorly along the right femoral head, with overlying chondral focal thinning but without overt bony fragmentation. There is a right hip joint effusion, and asymmetric moderate to prominent right degenerative chondral thinning. 2. Degenerative disc disease at L4-5. 3. Spurring of the right acetabulum. Electronically Signed   By: Van Clines M.D.   On: 12/19/2015 14:31    Assessment & Plan:   There are no diagnoses linked to this encounter. I have changed Mr. [place] amLODipine-olmesartan and tadalafil. I am also having him maintain his cholecalciferol, cetirizine, promethazine, tobramycin-dexamethasone, Pramoxine-HC, diclofenac sodium, HYDROcodone-acetaminophen, Vitamin D (Ergocalciferol), diazepam, traMADol, meloxicam, cyclobenzaprine, nortriptyline, pantoprazole, and alendronate. We will continue to administer methylPREDNISolone acetate and lidocaine (PF).  Meds ordered this encounter  Medications  .  amLODipine-olmesartan (AZOR) 5-40 MG tablet    Sig: Take 0.5 tablets by mouth daily.    Dispense:  45 tablet    Refill:  3  . tadalafil (CIALIS) 5 MG tablet    Sig: Take 1 tablet in the am for BPH.    Dispense:  90 tablet    Refill:  1  . pantoprazole (PROTONIX) 40 MG tablet    Sig: Take 1 tablet (40 mg total) by mouth daily.    Dispense:  90 tablet    Refill:  3  . alendronate (FOSAMAX) 70 MG tablet    Sig: Take 1 tablet (70 mg total) by mouth every 7 (seven) days. Take with a full glass of water on an empty stomach.    Dispense:  12 tablet    Refill:  3     Follow-up: No Follow-up on file.  Walker Kehr, MD

## 2016-03-25 NOTE — Assessment & Plan Note (Addendum)
We discussed age appropriate health related issues, including available/recomended screening tests and vaccinations. We discussed a need for adhering to healthy diet and exercise. Labs/EKG were reviewed/ordered. All questions were answered. Colon due in 2020 w/Dr Fuller Plan

## 2016-03-28 ENCOUNTER — Other Ambulatory Visit (INDEPENDENT_AMBULATORY_CARE_PROVIDER_SITE_OTHER): Payer: Managed Care, Other (non HMO)

## 2016-03-28 DIAGNOSIS — Z0000 Encounter for general adult medical examination without abnormal findings: Secondary | ICD-10-CM

## 2016-03-28 LAB — URINALYSIS
BILIRUBIN URINE: NEGATIVE
HGB URINE DIPSTICK: NEGATIVE
Ketones, ur: NEGATIVE
LEUKOCYTES UA: NEGATIVE
NITRITE: NEGATIVE
Specific Gravity, Urine: 1.02 (ref 1.000–1.030)
Total Protein, Urine: NEGATIVE
Urine Glucose: NEGATIVE
Urobilinogen, UA: 0.2 (ref 0.0–1.0)
pH: 6 (ref 5.0–8.0)

## 2016-03-28 LAB — CBC WITH DIFFERENTIAL/PLATELET
BASOS PCT: 0.4 % (ref 0.0–3.0)
Basophils Absolute: 0 10*3/uL (ref 0.0–0.1)
EOS ABS: 0.1 10*3/uL (ref 0.0–0.7)
EOS PCT: 1.7 % (ref 0.0–5.0)
HCT: 42.1 % (ref 39.0–52.0)
HEMOGLOBIN: 14 g/dL (ref 13.0–17.0)
LYMPHS ABS: 1 10*3/uL (ref 0.7–4.0)
Lymphocytes Relative: 17.6 % (ref 12.0–46.0)
MCHC: 33.3 g/dL (ref 30.0–36.0)
MCV: 89.5 fl (ref 78.0–100.0)
MONO ABS: 0.6 10*3/uL (ref 0.1–1.0)
Monocytes Relative: 10.5 % (ref 3.0–12.0)
NEUTROS ABS: 3.9 10*3/uL (ref 1.4–7.7)
Neutrophils Relative %: 69.8 % (ref 43.0–77.0)
PLATELETS: 226 10*3/uL (ref 150.0–400.0)
RBC: 4.71 Mil/uL (ref 4.22–5.81)
RDW: 13.8 % (ref 11.5–15.5)
WBC: 5.6 10*3/uL (ref 4.0–10.5)

## 2016-03-28 LAB — BASIC METABOLIC PANEL
BUN: 15 mg/dL (ref 6–23)
CHLORIDE: 103 meq/L (ref 96–112)
CO2: 27 meq/L (ref 19–32)
CREATININE: 1.09 mg/dL (ref 0.40–1.50)
Calcium: 9.5 mg/dL (ref 8.4–10.5)
GFR: 72.57 mL/min (ref >60.00)
GLUCOSE: 109 mg/dL — AB (ref 70–99)
POTASSIUM: 4.1 meq/L (ref 3.5–5.1)
Sodium: 139 mEq/L (ref 135–145)

## 2016-03-28 LAB — LIPID PANEL
CHOLESTEROL: 238 mg/dL — AB (ref 0–200)
HDL: 46.1 mg/dL (ref >39.00)
LDL CALC: 153 mg/dL — AB (ref 0–99)
NonHDL: 191.59
TRIGLYCERIDES: 194 mg/dL — AB (ref 0.0–149.0)
Total CHOL/HDL Ratio: 5
VLDL: 38.8 mg/dL (ref 0.0–40.0)

## 2016-03-28 LAB — HEPATIC FUNCTION PANEL
ALT: 59 U/L — AB (ref 0–53)
AST: 40 U/L — ABNORMAL HIGH (ref 0–37)
Albumin: 4.4 g/dL (ref 3.5–5.2)
Alkaline Phosphatase: 96 U/L (ref 39–117)
BILIRUBIN DIRECT: 0.2 mg/dL (ref 0.0–0.3)
BILIRUBIN TOTAL: 0.7 mg/dL (ref 0.2–1.2)
Total Protein: 7.3 g/dL (ref 6.0–8.3)

## 2016-03-28 LAB — TSH: TSH: 1.13 u[IU]/mL (ref 0.35–4.50)

## 2016-03-28 LAB — PSA: PSA: 0.98 ng/mL (ref 0.10–4.00)

## 2016-04-17 ENCOUNTER — Telehealth: Payer: Self-pay | Admitting: Internal Medicine

## 2016-04-17 ENCOUNTER — Ambulatory Visit: Payer: Self-pay | Admitting: Orthopedic Surgery

## 2016-04-17 NOTE — Telephone Encounter (Signed)
He is clear. I think I've signed it already Thx

## 2016-04-17 NOTE — Telephone Encounter (Signed)
Routing to dr [employer], have you seen any papers for sx clearance---please advise, thanks

## 2016-04-17 NOTE — Telephone Encounter (Signed)
Patient needs to be cleared for surgery. It was in May now It will be March 7. He states they sent [place] paperwork but have not heard anything. Have you got this paperwork? Please follow up with him once complete. Also Cecille Rubin told him at the surgeon office to have [place] follow up with her. Thank you.   Corey [name]

## 2016-04-21 NOTE — Telephone Encounter (Signed)
Patient was cleared and faxed

## 2016-04-25 ENCOUNTER — Encounter (HOSPITAL_COMMUNITY)
Admission: RE | Admit: 2016-04-25 | Discharge: 2016-04-25 | Disposition: A | Discharge status: Home / Self Care | Payer: Managed Care, Other (non HMO) | Source: Ambulatory Visit | Attending: Orthopedic Surgery | Admitting: Orthopedic Surgery

## 2016-04-25 ENCOUNTER — Encounter (HOSPITAL_COMMUNITY): Payer: Self-pay

## 2016-04-25 DIAGNOSIS — Z01812 Encounter for preprocedural laboratory examination: Secondary | ICD-10-CM | POA: Insufficient documentation

## 2016-04-25 HISTORY — DX: Cardiac murmur, unspecified: R01.1

## 2016-04-25 HISTORY — DX: Adverse effect of unspecified anesthetic, initial encounter: T41.45XA

## 2016-04-25 HISTORY — DX: Other complications of anesthesia, initial encounter: T88.59XA

## 2016-04-25 HISTORY — DX: Pneumonia, unspecified organism: J18.9

## 2016-04-25 HISTORY — DX: Personal history of urinary calculi: Z87.442

## 2016-04-25 HISTORY — DX: Unspecified osteoarthritis, unspecified site: M19.90

## 2016-04-25 LAB — COMPREHENSIVE METABOLIC PANEL
ALK PHOS: 102 U/L (ref 38–126)
ALT: 62 U/L (ref 17–63)
AST: 47 U/L — AB (ref 15–41)
Albumin: 4.8 g/dL (ref 3.5–5.0)
Anion gap: 8 (ref 5–15)
BILIRUBIN TOTAL: 0.9 mg/dL (ref 0.3–1.2)
BUN: 15 mg/dL (ref 6–20)
CALCIUM: 9.7 mg/dL (ref 8.9–10.3)
CO2: 26 mmol/L (ref 22–32)
CREATININE: 1.02 mg/dL (ref 0.61–1.24)
Chloride: 104 mmol/L (ref 101–111)
GFR calc Af Amer: >60 mL/min (ref >60)
Glucose, Bld: 99 mg/dL (ref 65–99)
Potassium: 4 mmol/L (ref 3.5–5.1)
Sodium: 138 mmol/L (ref 135–145)
TOTAL PROTEIN: 8.4 g/dL — AB (ref 6.5–8.1)

## 2016-04-25 LAB — SURGICAL PCR SCREEN
MRSA, PCR: NEGATIVE
STAPHYLOCOCCUS AUREUS: NEGATIVE

## 2016-04-25 LAB — APTT: aPTT: 32 seconds (ref 24–36)

## 2016-04-25 LAB — CBC
HEMATOCRIT: 41.6 % (ref 39.0–52.0)
Hemoglobin: 14.3 g/dL (ref 13.0–17.0)
MCH: 29.9 pg (ref 26.0–34.0)
MCHC: 34.4 g/dL (ref 30.0–36.0)
MCV: 87 fL (ref 78.0–100.0)
Platelets: 235 10*3/uL (ref 150–400)
RBC: 4.78 MIL/uL (ref 4.22–5.81)
RDW: 12.8 % (ref 11.5–15.5)
WBC: 5.7 10*3/uL (ref 4.0–10.5)

## 2016-04-25 LAB — ABO/RH: ABO/RH(D): B NEG

## 2016-04-25 LAB — PROTIME-INR
INR: 0.95
PROTHROMBIN TIME: 12.6 s (ref 11.4–15.2)

## 2016-04-25 NOTE — Patient Instructions (Addendum)
Corey [name]  04/25/2016   Your procedure is scheduled on: 04/30/16  Report to [facility]  Entrance take [facility]  elevators to 3rd floor to  [place] at 8:00 AM.  Call this number if you have problems the morning of surgery [phone]   Remember: ONLY 1 PERSON MAY GO WITH YOU TO SHORT STAY TO GET  READY MORNING OF [place].  Do not eat food or drink liquids :After Midnight.     Take these medicines the morning of surgery with A SIP OF WATER: Cetirizine, Pantoprazole, Tadalafil, Acetaminophen if needed.                               You may not have any metal on your body including hair pins and              piercings  Do not wear jewelry, make-up, lotions, powders or perfumes, deodorant             Do not wear nail polish.  Do not shave  48 hours prior to surgery.              Men may shave face and neck.   Do not bring valuables to the hospital. [place].  Contacts, dentures or bridgework may not be worn into surgery.  Leave suitcase in the car. After surgery it may be brought to your room.              Please read over the following fact sheets you were given: _____________________________________________________________________             [facility] - Preparing for Surgery Before surgery, you can play an important role.  Because skin is not sterile, your skin needs to be as free of germs as possible.  You can reduce the number of germs on your skin by washing with CHG (chlorahexidine gluconate) soap before surgery.  CHG is an antiseptic cleaner which kills germs and bonds with the skin to continue killing germs even after washing. Please DO NOT use if you have an allergy to CHG or antibacterial soaps.  If your skin becomes reddened/irritated stop using the CHG and inform your nurse when you arrive at Short Stay. Do not shave (including legs and underarms) for at least 48 hours prior to  the first CHG shower.  You may shave your face/neck. Please follow these instructions carefully:  1.  Shower with CHG Soap the night before surgery and the  morning of Surgery.  2.  If you choose to wash your hair, wash your hair first as usual with your  normal  shampoo.  3.  After you shampoo, rinse your hair and body thoroughly to remove the  shampoo.                           4.  Use CHG as you would any other liquid soap.  You can apply chg directly  to the skin and wash                       Gently with a scrungie or clean washcloth.  5.  Apply the CHG Soap  to your body ONLY FROM THE NECK DOWN.   Do not use on face/ open                           Wound or open sores. Avoid contact with eyes, ears mouth and genitals (private parts).                       Wash face,  Genitals (private parts) with your normal soap.             6.  Wash thoroughly, paying special attention to the area where your surgery  will be performed.  7.  Thoroughly rinse your body with warm water from the neck down.  8.  DO NOT shower/wash with your normal soap after using and rinsing off  the CHG Soap.                9.  Pat yourself dry with a clean towel.            10.  Wear clean pajamas.            11.  Place clean sheets on your bed the night of your first shower and do not  sleep with pets. Day of Surgery : Do not apply any lotions/deodorants the morning of surgery.  Please wear clean clothes to the hospital/surgery center.  FAILURE TO FOLLOW THESE INSTRUCTIONS MAY RESULT IN THE CANCELLATION OF YOUR SURGERY PATIENT SIGNATURE_________________________________  NURSE SIGNATURE__________________________________  ________________________________________________________________________   Adam Phenix  An incentive spirometer is a tool that can help keep your lungs clear and active. This tool measures how well you are filling your lungs with each breath. Taking long deep breaths may help reverse or  decrease the chance of developing breathing (pulmonary) problems (especially infection) following:  A long period of time when you are unable to move or be active. BEFORE THE PROCEDURE   If the spirometer includes an indicator to show your best effort, your nurse or respiratory therapist will set it to a desired goal.  If possible, sit up straight or lean slightly forward. Try not to slouch.  Hold the incentive spirometer in an upright position. INSTRUCTIONS FOR USE  1. Sit on the edge of your bed if possible, or sit up as far as you can in bed or on a chair. 2. Hold the incentive spirometer in an upright position. 3. Breathe out normally. 4. Place the mouthpiece in your mouth and seal your lips tightly around it. 5. Breathe in slowly and as deeply as possible, raising the piston or the ball toward the top of the column. 6. Hold your breath for 3-5 seconds or for as long as possible. Allow the piston or ball to fall to the bottom of the column. 7. Remove the mouthpiece from your mouth and breathe out normally. 8. Rest for a few seconds and repeat Steps 1 through 7 at least 10 times every 1-2 hours when you are awake. Take your time and take a few normal breaths between deep breaths. 9. The spirometer may include an indicator to show your best effort. Use the indicator as a goal to work toward during each repetition. 10. After each set of 10 deep breaths, practice coughing to be sure your lungs are clear. If you have an incision (the cut made at the time of surgery), support your incision when coughing by placing a pillow or rolled up towels firmly  against it. Once you are able to get out of bed, walk around indoors and cough well. You may stop using the incentive spirometer when instructed by your caregiver.  RISKS AND COMPLICATIONS  Take your time so you do not get dizzy or light-headed.  If you are in pain, you may need to take or ask for pain medication before doing incentive spirometry.  It is harder to take a deep breath if you are having pain. AFTER USE  Rest and breathe slowly and easily.  It can be helpful to keep track of a log of your progress. Your caregiver can provide you with a simple table to help with this. If you are using the spirometer at home, follow these instructions: [place] IF:   You are having difficultly using the spirometer.  You have trouble using the spirometer as often as instructed.  Your pain medication is not giving enough relief while using the spirometer.  You develop fever of 100.5 F (38.1 C) or higher. SEEK IMMEDIATE MEDICAL CARE IF:   You cough up bloody sputum that had not been present before.  You develop fever of 102 F (38.9 C) or greater.  You develop worsening pain at or near the incision site. MAKE SURE YOU:   Understand these instructions.  Will watch your condition.  Will get help right away if you are not doing well or get worse. Document Released: 06/23/2006 Document Revised: 05/05/2011 Document Reviewed: 08/24/2006 Exit[employer] Patient Information 2014 [employer], [place].   ________________________________________________________________________  WHAT IS A BLOOD TRANSFUSION? Blood Transfusion Information  A transfusion is the replacement of blood or some of its parts. Blood is made up of multiple cells which provide different functions.  Red blood cells carry oxygen and are used for blood loss replacement.  White blood cells fight against infection.  Platelets control bleeding.  Plasma helps clot blood.  Other blood products are available for specialized needs, such as hemophilia or other clotting disorders. BEFORE THE TRANSFUSION  Who gives blood for transfusions?   Healthy volunteers who are fully evaluated to make sure their blood is safe. This is blood bank blood. Transfusion therapy is the safest it has ever been in the practice of medicine. Before blood is taken from a donor, a complete  history is taken to make sure that person has no history of diseases nor engages in risky social behavior (examples are intravenous drug use or sexual activity with multiple partners). The donor's travel history is screened to minimize risk of transmitting infections, such as malaria. The donated blood is tested for signs of infectious diseases, such as HIV and hepatitis. The blood is then tested to be sure it is compatible with you in order to minimize the chance of a transfusion reaction. If you or a relative donates blood, this is often done in anticipation of surgery and is not appropriate for emergency situations. It takes many days to process the donated blood. RISKS AND COMPLICATIONS Although transfusion therapy is very safe and saves many lives, the main dangers of transfusion include:   Getting an infectious disease.  Developing a transfusion reaction. This is an allergic reaction to something in the blood you were given. Every precaution is taken to prevent this. The decision to have a blood transfusion has been considered carefully by your caregiver before blood is given. Blood is not given unless the benefits outweigh the risks. AFTER THE TRANSFUSION  Right after receiving a blood transfusion, you will usually feel much better and  more energetic. This is especially true if your red blood cells have gotten low (anemic). The transfusion raises the level of the red blood cells which carry oxygen, and this usually causes an energy increase.  The nurse administering the transfusion will monitor you carefully for complications. HOME CARE INSTRUCTIONS  No special instructions are needed after a transfusion. You may find your energy is better. Speak with your caregiver about any limitations on activity for underlying diseases you may have. SEEK MEDICAL CARE IF:   Your condition is not improving after your transfusion.  You develop redness or irritation at the intravenous (IV) site. SEEK  IMMEDIATE MEDICAL CARE IF:  Any of the following symptoms occur over the next 12 hours:  Shaking chills.  You have a temperature by mouth above 102 F (38.9 C), not controlled by medicine.  Chest, back, or muscle pain.  People around you feel you are not acting correctly or are confused.  Shortness of breath or difficulty breathing.  Dizziness and fainting.  You get a rash or develop hives.  You have a decrease in urine output.  Your urine turns a dark color or changes to pink, red, or brown. Any of the following symptoms occur over the next 10 days:  You have a temperature by mouth above 102 F (38.9 C), not controlled by medicine.  Shortness of breath.  Weakness after normal activity.  The white part of the eye turns yellow (jaundice).  You have a decrease in the amount of urine or are urinating less often.  Your urine turns a dark color or changes to pink, red, or brown. Document Released: 02/08/2000 Document Revised: 05/05/2011 Document Reviewed: 09/27/2007 [facility] Patient Information 2014 [place], [place].  _______________________________________________________________________

## 2016-04-25 NOTE — Pre-Procedure Instructions (Signed)
LOV Dr. Alain Marion 03-25-16 epic Clearance Dr. Alain Marion on chart EKG 03-25-16 epic

## 2016-04-29 ENCOUNTER — Ambulatory Visit: Payer: Self-pay | Admitting: Orthopedic Surgery

## 2016-04-29 NOTE — H&P (Signed)
Corey [name] DOB: 11/28/1950 Divorced / Language: English / Race: White Male Date of Admission:  04/30/2016 CC:  Right hip pain History of Present Illness  The patient is a 65 year old male who comes in  for a preoperative History and Physical. The patient is scheduled for a right total hip arthroplasty (anterior) to be performed by Dr. Frank V. Aluisio, MD at [facility] on 04/30/2016. The patient reports right hip problems including pain symptoms that have been present for many month(s). The symptoms began without any known injury. The patient reports symptoms radiating to the: right groin, right thigh and right calf. The patient feels as if their symptoms are does feel they are worsening. Symptoms are exacerbated by movement and flexing hip. Current treatment includes non-opioid analgesics (Tylenol PRN). Prior to being seen the patient was previously evaluated by a primary physician. Previous workup for this problem has included hip x-rays and hip MRI. Corey [name] has been treated by Dr. Zachary Smith. He had a regular x-ray and MRI which showed a significant amount of early arthritis in the hip with a lot of edema in the joint and a large effusion. He said his hip is hurting all the time. It is hurting at day and night. It is getting progressively worse every day. This is limiting what he can and cannot do. He does not recall any previous injury to that hip. He has not had any problems with the hip prior to this episode for the past 4 months. He is not having any left hip pain. He is not having any back pain, lower extremity weakness or paresthesia. His plain x-rays from a few months ago and he still has a decent joint space left. No where near bone on bone. I reviewed his MRI and he has got a very impressive MRI. 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 He has very large effusion. He does have a pretty significant secondary degenerative  change in the hip. It is felt that he has developped a rapidly progressive osteoarthritis. I think that fragmentation is occurring with this rapidly progressive OA. At this point, we can get some short-term temporary relief with an intraarticular injection, but long-term the only thing is going to get him better is a total hip arthroplasty. He would like to proceed with surgery at this time. They have been treated conservatively in the past for the above stated problem and despite conservative measures, they continue to have progressive pain and severe functional limitations and dysfunction. They have failed non-operative management including home exercise, medications. It is felt that they would benefit from undergoing total joint replacement. Risks and benefits of the procedure have been discussed with the patient and they elect to proceed with surgery. There are no active contraindications to surgery such as ongoing infection or rapidly progressive neurological disease.  Problem List/Past Medical  Primary osteoarthritis of right hip (M16.11)  Heart murmur  Hypertension  Gastroesophageal Reflux Disease  Eczema  Allergies Penicillin V *PENICILLINS*  Cipro *FLUOROQUINOLONES*  Statins Peanuts  Seafood  Tomato  Corn (Syrup)   Family History First Degree Relatives  reported No pertinent family history   Social History  Children  2 Current work status  working full time Exercise  Exercises weekly; does running / walking, individual sport and gym / weights Marital status  divorced Never consumed alcohol  01/23/2016: Never consumed alcohol No history of drug/alcohol rehab  Not under pain   contract  Number of flights of stairs before winded  greater than 5 Tobacco / smoke exposure  01/23/2016: no Tobacco use  Never smoker. 01/23/2016 Advance Directives  Living Will, Healthcare POA  Medication History  Azor (Oral) Specific strength unknown - Active. Protonix  (Oral) Specific strength unknown - Active. Cialis (Oral) Specific strength unknown - Active.  Past Surgical History  Tonsillectomy  Kidney Stone Surgery  Date: 1995.   Review of Systems General Not Present- Chills, Fatigue, Fever, Memory Loss, Night Sweats, Weight Gain and Weight Loss. Skin Not Present- Eczema, Hives, Itching, Lesions and Rash. HEENT Not Present- Dentures, Double Vision, Headache, Hearing Loss, Tinnitus and Visual Loss. Respiratory Not Present- Allergies, Chronic Cough, Coughing up blood, Shortness of breath at rest and Shortness of breath with exertion. Cardiovascular Not Present- Chest Pain, Difficulty Breathing Lying Down, Murmur, Palpitations, Racing/skipping heartbeats and Swelling. Gastrointestinal Not Present- Abdominal Pain, Bloody Stool, Constipation, Diarrhea, Difficulty Swallowing, Heartburn, Jaundice, Loss of appetitie, Nausea and Vomiting. Male Genitourinary Not Present- Blood in Urine, Discharge, Flank Pain, Incontinence, Painful Urination, Urgency, Urinary frequency, Urinary Retention, Urinating at Night and Weak urinary stream. Musculoskeletal Present- Joint Pain. Not Present- Back Pain, Joint Swelling, Morning Stiffness, Muscle Pain, Muscle Weakness and Spasms. Neurological Not Present- Blackout spells, Difficulty with balance, Dizziness, Paralysis, Tremor and Weakness. Psychiatric Not Present- Insomnia.  Vitals  Weight: 216 lb Height: 74in Weight was reported by patient. Body Surface Area: 2.25 m Body Mass Index: 27.73 kg/m  Pulse: 84 (Regular)  BP: 128/78 (Sitting, Left Arm, Standard)  Physical Exam General Mental Status -Alert, cooperative and good historian. General Appearance-pleasant, Not in acute distress. Orientation-Oriented X3. Build & Nutrition-Well nourished and Well developed.  Head and Neck Head-normocephalic, atraumatic . Neck Global Assessment - supple, no bruit auscultated on the right, no bruit  auscultated on the left.  Eye Vision-Wears contact lenses. Pupil - Bilateral-Regular and Round. Motion - Bilateral-EOMI.  Chest and Lung Exam Auscultation Breath sounds - clear at anterior chest wall and clear at posterior chest wall. Adventitious sounds - No Adventitious sounds.  Cardiovascular Auscultation Rhythm - Regular rate and rhythm. Heart Sounds - S1 WNL and S2 WNL. Murmurs & Other Heart Sounds - Auscultation of the heart reveals - No Murmurs.  Abdomen Palpation/Percussion Tenderness - Abdomen is non-tender to palpation. Rigidity (guarding) - Abdomen is soft. Auscultation Auscultation of the abdomen reveals - Bowel sounds normal.  Musculoskeletal Note: Well-developed male, alert and oriented, in no apparent distress. Evaluation of his right hip shows flexion about 90, minimal internal rotation, only about 10 degrees of external rotation and 10 to 20 degrees of abduction. He has significant pain on range of motion of that hip. Left hip flexion 130, rotation in 30, out 40, abduction 40 without pain. He has a significantly antalgic gait pattern on the right.  I reviewed his plain x-rays from a few months ago and he still has a decent joint space left. No where near bone on bone. I reviewed his MRI and he has got a very impressive MRI. 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 He has very large effusion. He does have a pretty significant secondary degenerative change in the hip.  Assessment & Plan Primary osteoarthritis of right hip (M16.11)  Note:Surgical Plans: Right Total Hip Replacement - Anterior Approach  Disposition: Home with help from a caregiver, Start HHPT following the hospital stay.  PCP: Dr. Plotnikov - Patient has been seen preoperatively and felt to   be stable for surgery.  IV TXA  Anesthesia Issues: Very difficult to wake up following one kidney stone surgery, but has had a couple of kidney  stone srugeries since and did fine.  Patient was instructed on what medications to stop prior to surgery.  Signed electronically by Alezandrew L Perkins, III PA-C  

## 2016-04-30 ENCOUNTER — Encounter (HOSPITAL_COMMUNITY): Payer: Self-pay

## 2016-04-30 ENCOUNTER — Inpatient Hospital Stay (HOSPITAL_COMMUNITY): Payer: Managed Care, Other (non HMO)

## 2016-04-30 ENCOUNTER — Inpatient Hospital Stay (HOSPITAL_COMMUNITY): Payer: Managed Care, Other (non HMO) | Admitting: Anesthesiology

## 2016-04-30 ENCOUNTER — Inpatient Hospital Stay (HOSPITAL_COMMUNITY)
Admission: RE | Admit: 2016-04-30 | Discharge: 2016-05-01 | DRG: 470 | Disposition: A | Discharge status: Home Health | Payer: Managed Care, Other (non HMO) | Source: Ambulatory Visit | Attending: Orthopedic Surgery | Admitting: Orthopedic Surgery

## 2016-04-30 ENCOUNTER — Encounter (HOSPITAL_COMMUNITY)
Admission: RE | Disposition: A | Discharge status: Home Health | Payer: Self-pay | Source: Ambulatory Visit | Attending: Orthopedic Surgery

## 2016-04-30 DIAGNOSIS — I10 Essential (primary) hypertension: Secondary | ICD-10-CM | POA: Diagnosis present

## 2016-04-30 DIAGNOSIS — K219 Gastro-esophageal reflux disease without esophagitis: Secondary | ICD-10-CM | POA: Diagnosis present

## 2016-04-30 DIAGNOSIS — E785 Hyperlipidemia, unspecified: Secondary | ICD-10-CM | POA: Diagnosis present

## 2016-04-30 DIAGNOSIS — Z87442 Personal history of urinary calculi: Secondary | ICD-10-CM

## 2016-04-30 DIAGNOSIS — M25551 Pain in right hip: Secondary | ICD-10-CM | POA: Diagnosis present

## 2016-04-30 DIAGNOSIS — M169 Osteoarthritis of hip, unspecified: Secondary | ICD-10-CM | POA: Diagnosis present

## 2016-04-30 DIAGNOSIS — J45909 Unspecified asthma, uncomplicated: Secondary | ICD-10-CM | POA: Diagnosis present

## 2016-04-30 DIAGNOSIS — Z96649 Presence of unspecified artificial hip joint: Secondary | ICD-10-CM

## 2016-04-30 DIAGNOSIS — M1611 Unilateral primary osteoarthritis, right hip: Secondary | ICD-10-CM | POA: Diagnosis present

## 2016-04-30 HISTORY — PX: TOTAL HIP ARTHROPLASTY: SHX124

## 2016-04-30 SURGERY — ARTHROPLASTY, HIP, TOTAL, ANTERIOR APPROACH
Anesthesia: Spinal | Site: Hip | Laterality: Right

## 2016-04-30 MED ORDER — AMLODIPINE BESYLATE 5 MG PO TABS
5.0000 mg | ORAL_TABLET | Freq: Once | ORAL | Status: AC
  Administered 2016-04-30: 5 mg via ORAL
  Filled 2016-04-30: qty 1

## 2016-04-30 MED ORDER — METHOCARBAMOL 500 MG PO TABS
500.0000 mg | ORAL_TABLET | Freq: Four times a day (QID) | ORAL | Status: DC | PRN
  Administered 2016-04-30 – 2016-05-01 (×2): 500 mg via ORAL
  Filled 2016-04-30 (×2): qty 1

## 2016-04-30 MED ORDER — ONDANSETRON HCL 4 MG/2ML IJ SOLN
INTRAMUSCULAR | Status: AC
  Filled 2016-04-30: qty 2

## 2016-04-30 MED ORDER — OXYCODONE HCL 5 MG PO TABS
5.0000 mg | ORAL_TABLET | ORAL | Status: DC | PRN
  Administered 2016-04-30: 5 mg via ORAL
  Administered 2016-04-30: 19:00:00 10 mg via ORAL
  Administered 2016-04-30: 15:00:00 5 mg via ORAL
  Administered 2016-05-01 (×3): 10 mg via ORAL
  Filled 2016-04-30: qty 2
  Filled 2016-04-30: qty 1
  Filled 2016-04-30: qty 2
  Filled 2016-04-30: qty 1
  Filled 2016-04-30 (×2): qty 2

## 2016-04-30 MED ORDER — SODIUM CHLORIDE 0.9 % IV SOLN
INTRAVENOUS | Status: DC
  Administered 2016-04-30 – 2016-05-01 (×2): via INTRAVENOUS

## 2016-04-30 MED ORDER — MIDAZOLAM HCL 2 MG/2ML IJ SOLN
INTRAMUSCULAR | Status: AC
  Filled 2016-04-30: qty 2

## 2016-04-30 MED ORDER — DOCUSATE SODIUM 100 MG PO CAPS
100.0000 mg | ORAL_CAPSULE | Freq: Two times a day (BID) | ORAL | Status: DC
  Administered 2016-04-30 – 2016-05-01 (×2): 100 mg via ORAL
  Filled 2016-04-30 (×2): qty 1

## 2016-04-30 MED ORDER — PANTOPRAZOLE SODIUM 40 MG PO PACK
10.0000 mg | PACK | Freq: Every day | ORAL | Status: DC
  Administered 2016-05-01: 10 mg via ORAL
  Filled 2016-04-30: qty 20

## 2016-04-30 MED ORDER — DEXAMETHASONE SODIUM PHOSPHATE 10 MG/ML IJ SOLN
INTRAMUSCULAR | Status: AC
  Filled 2016-04-30: qty 1

## 2016-04-30 MED ORDER — MORPHINE SULFATE (PF) 4 MG/ML IV SOLN
1.0000 mg | INTRAVENOUS | Status: DC | PRN
  Administered 2016-04-30 (×2): 1 mg via INTRAVENOUS
  Filled 2016-04-30 (×2): qty 1

## 2016-04-30 MED ORDER — BUPIVACAINE HCL (PF) 0.25 % IJ SOLN
INTRAMUSCULAR | Status: AC
  Filled 2016-04-30: qty 30

## 2016-04-30 MED ORDER — LORATADINE 10 MG PO TABS
10.0000 mg | ORAL_TABLET | Freq: Two times a day (BID) | ORAL | Status: DC
  Administered 2016-04-30 – 2016-05-01 (×2): 10 mg via ORAL
  Filled 2016-04-30 (×2): qty 1

## 2016-04-30 MED ORDER — DEXAMETHASONE SODIUM PHOSPHATE 10 MG/ML IJ SOLN
10.0000 mg | Freq: Once | INTRAMUSCULAR | Status: AC
  Administered 2016-04-30: 10 mg via INTRAVENOUS

## 2016-04-30 MED ORDER — VANCOMYCIN HCL 10 G IV SOLR
1500.0000 mg | Freq: Once | INTRAVENOUS | Status: AC
  Administered 2016-04-30: 1500 mg via INTRAVENOUS
  Filled 2016-04-30: qty 1500

## 2016-04-30 MED ORDER — TRANEXAMIC ACID 1000 MG/10ML IV SOLN
1000.0000 mg | Freq: Once | INTRAVENOUS | Status: AC
  Administered 2016-04-30: 15:00:00 1000 mg via INTRAVENOUS
  Filled 2016-04-30: qty 1100

## 2016-04-30 MED ORDER — FENTANYL CITRATE (PF) 100 MCG/2ML IJ SOLN
25.0000 ug | INTRAMUSCULAR | Status: DC | PRN
  Administered 2016-04-30 (×2): 50 ug via INTRAVENOUS

## 2016-04-30 MED ORDER — PHENYLEPHRINE 40 MCG/ML (10ML) SYRINGE FOR IV PUSH (FOR BLOOD PRESSURE SUPPORT)
PREFILLED_SYRINGE | INTRAVENOUS | Status: DC | PRN
  Administered 2016-04-30 (×2): 120 ug via INTRAVENOUS
  Administered 2016-04-30 (×4): 80 ug via INTRAVENOUS

## 2016-04-30 MED ORDER — DIPHENHYDRAMINE HCL 12.5 MG/5ML PO ELIX: 12.5000 mg | ORAL_SOLUTION | ORAL | Status: DC | PRN

## 2016-04-30 MED ORDER — ACETAMINOPHEN 500 MG PO TABS
1000.0000 mg | ORAL_TABLET | Freq: Four times a day (QID) | ORAL | Status: DC
  Administered 2016-04-30 – 2016-05-01 (×3): 1000 mg via ORAL
  Filled 2016-04-30 (×3): qty 2

## 2016-04-30 MED ORDER — CHLORHEXIDINE GLUCONATE 4 % EX LIQD: 60.0000 mL | Freq: Once | CUTANEOUS | Status: DC

## 2016-04-30 MED ORDER — IRBESARTAN 150 MG PO TABS
300.0000 mg | ORAL_TABLET | Freq: Every day | ORAL | Status: DC
  Administered 2016-05-01: 300 mg via ORAL
  Filled 2016-04-30: qty 2

## 2016-04-30 MED ORDER — ONDANSETRON HCL 4 MG/2ML IJ SOLN
4.0000 mg | Freq: Four times a day (QID) | INTRAMUSCULAR | Status: DC | PRN
  Administered 2016-04-30 – 2016-05-01 (×2): 4 mg via INTRAVENOUS
  Filled 2016-04-30 (×2): qty 2

## 2016-04-30 MED ORDER — POLYETHYLENE GLYCOL 3350 17 G PO PACK: 17.0000 g | PACK | Freq: Every day | ORAL | Status: DC | PRN

## 2016-04-30 MED ORDER — ONDANSETRON HCL 4 MG PO TABS: 4.0000 mg | ORAL_TABLET | Freq: Four times a day (QID) | ORAL | Status: DC | PRN

## 2016-04-30 MED ORDER — FENTANYL CITRATE (PF) 100 MCG/2ML IJ SOLN
INTRAMUSCULAR | Status: DC | PRN
  Administered 2016-04-30 (×2): 50 ug via INTRAVENOUS

## 2016-04-30 MED ORDER — DEXAMETHASONE SODIUM PHOSPHATE 10 MG/ML IJ SOLN
10.0000 mg | Freq: Once | INTRAMUSCULAR | Status: AC
  Administered 2016-05-01: 10 mg via INTRAVENOUS
  Filled 2016-04-30: qty 1

## 2016-04-30 MED ORDER — PHENYLEPHRINE HCL 10 MG/ML IJ SOLN
INTRAVENOUS | Status: DC | PRN
  Administered 2016-04-30: 25 ug/min via INTRAVENOUS

## 2016-04-30 MED ORDER — BUPIVACAINE HCL (PF) 0.25 % IJ SOLN
INTRAMUSCULAR | Status: DC | PRN
  Administered 2016-04-30: 30 mL

## 2016-04-30 MED ORDER — RIVAROXABAN 10 MG PO TABS
10.0000 mg | ORAL_TABLET | Freq: Every day | ORAL | Status: DC
  Administered 2016-05-01: 10 mg via ORAL
  Filled 2016-04-30: qty 1

## 2016-04-30 MED ORDER — MIDAZOLAM HCL 2 MG/2ML IJ SOLN
INTRAMUSCULAR | Status: DC | PRN
  Administered 2016-04-30: 2 mg via INTRAVENOUS

## 2016-04-30 MED ORDER — PROMETHAZINE HCL 25 MG/ML IJ SOLN: 6.2500 mg | INTRAMUSCULAR | Status: DC | PRN

## 2016-04-30 MED ORDER — PROPOFOL 10 MG/ML IV BOLUS
INTRAVENOUS | Status: AC
  Filled 2016-04-30: qty 20

## 2016-04-30 MED ORDER — METOCLOPRAMIDE HCL 5 MG/ML IJ SOLN
5.0000 mg | Freq: Three times a day (TID) | INTRAMUSCULAR | Status: DC | PRN
  Administered 2016-05-01: 11:00:00 10 mg via INTRAVENOUS
  Filled 2016-04-30: qty 2

## 2016-04-30 MED ORDER — TRANEXAMIC ACID 1000 MG/10ML IV SOLN
1000.0000 mg | INTRAVENOUS | Status: AC
  Administered 2016-04-30: 1000 mg via INTRAVENOUS
  Filled 2016-04-30: qty 1100

## 2016-04-30 MED ORDER — TRAMADOL HCL 50 MG PO TABS: 50.0000 mg | ORAL_TABLET | Freq: Four times a day (QID) | ORAL | Status: DC | PRN

## 2016-04-30 MED ORDER — HYDROMORPHONE HCL 1 MG/ML IJ SOLN
2.0000 mg | INTRAMUSCULAR | Status: DC | PRN
  Administered 2016-04-30: 2 mg via INTRAVENOUS
  Filled 2016-04-30: qty 2

## 2016-04-30 MED ORDER — FLEET ENEMA 7-19 GM/118ML RE ENEM: 1.0000 | ENEMA | Freq: Once | RECTAL | Status: DC | PRN

## 2016-04-30 MED ORDER — PHENYLEPHRINE 40 MCG/ML (10ML) SYRINGE FOR IV PUSH (FOR BLOOD PRESSURE SUPPORT)
PREFILLED_SYRINGE | INTRAVENOUS | Status: AC
  Filled 2016-04-30: qty 10

## 2016-04-30 MED ORDER — ACETAMINOPHEN 650 MG RE SUPP: 650.0000 mg | Freq: Four times a day (QID) | RECTAL | Status: DC | PRN

## 2016-04-30 MED ORDER — BISACODYL 10 MG RE SUPP: 10.0000 mg | Freq: Every day | RECTAL | Status: DC | PRN

## 2016-04-30 MED ORDER — MENTHOL 3 MG MT LOZG: 1.0000 | LOZENGE | OROMUCOSAL | Status: DC | PRN

## 2016-04-30 MED ORDER — LIDOCAINE 2% (20 MG/ML) 5 ML SYRINGE
INTRAMUSCULAR | Status: AC
  Filled 2016-04-30: qty 5

## 2016-04-30 MED ORDER — ACETAMINOPHEN 10 MG/ML IV SOLN
INTRAVENOUS | Status: AC
  Filled 2016-04-30: qty 100

## 2016-04-30 MED ORDER — ACETAMINOPHEN 10 MG/ML IV SOLN
1000.0000 mg | Freq: Once | INTRAVENOUS | Status: AC
  Administered 2016-04-30: 1000 mg via INTRAVENOUS

## 2016-04-30 MED ORDER — METOCLOPRAMIDE HCL 5 MG PO TABS: 5.0000 mg | ORAL_TABLET | Freq: Three times a day (TID) | ORAL | Status: DC | PRN

## 2016-04-30 MED ORDER — ACETAMINOPHEN 325 MG PO TABS: 650.0000 mg | ORAL_TABLET | Freq: Four times a day (QID) | ORAL | Status: DC | PRN

## 2016-04-30 MED ORDER — PANTOPRAZOLE SODIUM 40 MG PO PACK: 10.0000 mg | PACK | Freq: Every day | ORAL | Status: DC

## 2016-04-30 MED ORDER — PROPOFOL 500 MG/50ML IV EMUL
INTRAVENOUS | Status: DC | PRN
  Administered 2016-04-30: 75 ug/kg/min via INTRAVENOUS

## 2016-04-30 MED ORDER — FENTANYL CITRATE (PF) 100 MCG/2ML IJ SOLN
INTRAMUSCULAR | Status: AC
  Filled 2016-04-30: qty 2

## 2016-04-30 MED ORDER — METHOCARBAMOL 1000 MG/10ML IJ SOLN
500.0000 mg | Freq: Four times a day (QID) | INTRAVENOUS | Status: DC | PRN
  Filled 2016-04-30: qty 5

## 2016-04-30 MED ORDER — LIDOCAINE 2% (20 MG/ML) 5 ML SYRINGE
INTRAMUSCULAR | Status: DC | PRN
  Administered 2016-04-30: 40 mg via INTRAVENOUS

## 2016-04-30 MED ORDER — ONDANSETRON HCL 4 MG/2ML IJ SOLN
INTRAMUSCULAR | Status: DC | PRN
Start: 2016-04-30 — End: 2016-04-30
  Administered 2016-04-30: 4 mg via INTRAVENOUS

## 2016-04-30 MED ORDER — PROPOFOL 10 MG/ML IV BOLUS
INTRAVENOUS | Status: DC | PRN
  Administered 2016-04-30: 20 mg via INTRAVENOUS
  Administered 2016-04-30: 10 mg via INTRAVENOUS
  Administered 2016-04-30 (×5): 20 mg via INTRAVENOUS

## 2016-04-30 MED ORDER — BUPIVACAINE IN DEXTROSE 0.75-8.25 % IT SOLN
INTRATHECAL | Status: DC | PRN
  Administered 2016-04-30: 2 mL via INTRATHECAL

## 2016-04-30 MED ORDER — AMLODIPINE BESYLATE 5 MG PO TABS
5.0000 mg | ORAL_TABLET | Freq: Every day | ORAL | Status: DC
  Administered 2016-05-01: 08:00:00 5 mg via ORAL
  Filled 2016-04-30: qty 1

## 2016-04-30 MED ORDER — LACTATED RINGERS IV SOLN
INTRAVENOUS | Status: DC
  Administered 2016-04-30 (×4): via INTRAVENOUS

## 2016-04-30 MED ORDER — PHENOL 1.4 % MT LIQD: 1.0000 | OROMUCOSAL | Status: DC | PRN

## 2016-04-30 MED ORDER — PANTOPRAZOLE SODIUM 20 MG PO TBEC: 20.0000 mg | DELAYED_RELEASE_TABLET | Freq: Every day | ORAL | Status: DC

## 2016-04-30 MED ORDER — VANCOMYCIN HCL IN DEXTROSE 1-5 GM/200ML-% IV SOLN
1000.0000 mg | Freq: Two times a day (BID) | INTRAVENOUS | Status: AC
  Administered 2016-04-30: 22:00:00 1000 mg via INTRAVENOUS
  Filled 2016-04-30: qty 200

## 2016-04-30 MED ORDER — AMLODIPINE-OLMESARTAN 5-40 MG PO TABS: 0.5000 | ORAL_TABLET | Freq: Every day | ORAL | Status: DC

## 2016-04-30 SURGICAL SUPPLY — 35 items
BAG DECANTER FOR FLEXI CONT (MISCELLANEOUS) ×3 IMPLANT
BAG ZIPLOCK 12X15 (MISCELLANEOUS) IMPLANT
BLADE SAG 18X100X1.27 (BLADE) ×3 IMPLANT
CAPT HIP TOTAL 2 ×3 IMPLANT
CLOSURE WOUND 1/2 X4 (GAUZE/BANDAGES/DRESSINGS) ×1
CLOTH BEACON ORANGE TIMEOUT ST (SAFETY) ×3 IMPLANT
COVER PERINEAL POST (MISCELLANEOUS) ×3 IMPLANT
DECANTER SPIKE VIAL GLASS SM (MISCELLANEOUS) ×3 IMPLANT
DRAPE STERI IOBAN 125X83 (DRAPES) ×3 IMPLANT
DRAPE U-SHAPE 47X51 STRL (DRAPES) ×6 IMPLANT
DRSG ADAPTIC 3X8 NADH LF (GAUZE/BANDAGES/DRESSINGS) ×3 IMPLANT
DRSG MEPILEX BORDER 4X4 (GAUZE/BANDAGES/DRESSINGS) ×3 IMPLANT
DRSG MEPILEX BORDER 4X8 (GAUZE/BANDAGES/DRESSINGS) ×3 IMPLANT
DURAPREP 26ML APPLICATOR (WOUND CARE) ×3 IMPLANT
ELECT REM PT RETURN 9FT ADLT (ELECTROSURGICAL) ×3
ELECTRODE REM PT RTRN 9FT ADLT (ELECTROSURGICAL) ×1 IMPLANT
EVACUATOR 1/8 PVC DRAIN (DRAIN) ×3 IMPLANT
GLOVE BIO SURGEON STRL SZ7.5 (GLOVE) ×3 IMPLANT
GLOVE BIO SURGEON STRL SZ8 (GLOVE) ×6 IMPLANT
GLOVE BIOGEL PI IND STRL 8 (GLOVE) ×2 IMPLANT
GLOVE BIOGEL PI INDICATOR 8 (GLOVE) ×4
GOWN STRL REUS W/TWL LRG LVL3 (GOWN DISPOSABLE) ×3 IMPLANT
GOWN STRL REUS W/TWL XL LVL3 (GOWN DISPOSABLE) ×3 IMPLANT
NS IRRIG 1000ML POUR BTL (IV SOLUTION) ×3 IMPLANT
PACK ANTERIOR HIP CUSTOM (KITS) ×3 IMPLANT
STRIP CLOSURE SKIN 1/2X4 (GAUZE/BANDAGES/DRESSINGS) ×2 IMPLANT
SUT ETHIBOND NAB CT1 #1 30IN (SUTURE) ×3 IMPLANT
SUT MNCRL AB 4-0 PS2 18 (SUTURE) ×3 IMPLANT
SUT VIC AB 2-0 CT1 27 (SUTURE) ×4
SUT VIC AB 2-0 CT1 TAPERPNT 27 (SUTURE) ×2 IMPLANT
SUT VLOC 180 0 24IN GS25 (SUTURE) ×3 IMPLANT
SYR 50ML LL SCALE MARK (SYRINGE) IMPLANT
TRAY FOLEY W/METER SILVER 16FR (SET/KITS/TRAYS/PACK) ×3 IMPLANT
WATER STERILE IRR 1000ML POUR (IV SOLUTION) ×6 IMPLANT
YANKAUER SUCT BULB TIP 10FT TU (MISCELLANEOUS) ×3 IMPLANT

## 2016-04-30 NOTE — Anesthesia Preprocedure Evaluation (Addendum)
Anesthesia Evaluation  Patient identified by MRN, date of birth, ID band Patient awake    Reviewed: Allergy & Precautions, NPO status , Patient's Chart, lab work & pertinent test results  History of Anesthesia Complications (+) PROLONGED EMERGENCE and history of anesthetic complications  Airway Mallampati: II  TM Distance: >3 FB Neck ROM: Full    Dental  (+) Teeth Intact, Dental Advisory Given   Pulmonary asthma ,    Pulmonary exam normal breath sounds clear to auscultation       Cardiovascular hypertension, Pt. on medications Normal cardiovascular exam Rhythm:Regular Rate:Normal     Neuro/Psych negative neurological ROS  negative psych ROS   GI/Hepatic Neg liver ROS, GERD  Medicated,Barrett's esophagus   Endo/Other  negative endocrine ROS  Renal/GU negative Renal ROS     Musculoskeletal  (+) Arthritis , Osteoarthritis,    Abdominal   Peds  Hematology negative hematology ROS (+) Plt 235k   Anesthesia Other Findings Day of surgery medications reviewed with the patient.  Reproductive/Obstetrics                           Anesthesia Physical Anesthesia Plan  ASA: II  Anesthesia Plan: Spinal and MAC   Post-op Pain Management:    Induction: Intravenous  Airway Management Planned: Simple Face Mask  Additional Equipment:   Intra-op Plan:   Post-operative Plan:   Informed Consent: I have reviewed the patients History and Physical, chart, labs and discussed the procedure including the risks, benefits and alternatives for the proposed anesthesia with the patient or authorized representative who has indicated his/her understanding and acceptance.   Dental advisory given  Plan Discussed with: CRNA, Anesthesiologist and Surgeon  Anesthesia Plan Comments: (Discussed risks and benefits of and differences between spinal and general. Discussed risks of spinal including headache, backache,  failure, bleeding, infection, and nerve damage. Patient consents to spinal. Questions answered. Coagulation studies and platelet count acceptable.)        Anesthesia Quick Evaluation

## 2016-04-30 NOTE — Anesthesia Procedure Notes (Signed)
Spinal  Patient location during procedure: OR Start time: 04/30/2016 10:49 AM End time: 04/30/2016 10:51 AM Staffing Anesthesiologist: Catalina Gravel Performed: anesthesiologist  Preanesthetic Checklist Completed: patient identified, surgical consent, pre-op evaluation, timeout performed, IV checked, risks and benefits discussed and monitors and equipment checked Spinal Block Patient position: sitting Prep: site prepped and draped and DuraPrep Patient monitoring: continuous pulse ox and blood pressure Approach: midline Location: L3-4 Needle Needle type: Sprotte  Needle gauge: 24 G Needle length: 9 cm Assessment Sensory level: T8 Additional Notes Functioning IV was confirmed and monitors were applied. Sterile prep and drape, including hand hygiene, mask and sterile gloves were used. The patient was positioned and the spine was prepped. The skin was anesthetized with lidocaine.  Free flow of clear CSF was obtained prior to injecting local anesthetic into the CSF.  The spinal needle aspirated freely following injection.  The needle was carefully withdrawn.  The patient tolerated the procedure well. Consent was obtained prior to procedure with all questions answered and concerns addressed. Risks including but not limited to bleeding, infection, nerve damage, paralysis, failed block, inadequate analgesia, allergic reaction, high spinal, itching and headache were discussed and the patient wished to proceed.   Hoy Morn, MD

## 2016-04-30 NOTE — H&P (View-Only) (Signed)
Corey [name] DOB: 03-30-1950 Divorced / Language: Corey [name] / Race: White Male Date of Admission:  04/30/2016 CC:  Right hip pain History of Present Illness  The patient is a 66 year old male who comes in  for a preoperative History and Physical. The patient is scheduled for a right total hip arthroplasty (anterior) to be performed by Dr. Dione Plover. Aluisio, MD at [facility] on 04/30/2016. The patient reports right hip problems including pain symptoms that have been present for many month(s). The symptoms began without any known injury. The patient reports symptoms radiating to the: right groin, right thigh and right calf. The patient feels as if their symptoms are does feel they are worsening. Symptoms are exacerbated by movement and flexing hip. Current treatment includes non-opioid analgesics (Tylenol PRN). Prior to being seen the patient was previously evaluated by a primary physician. Previous workup for this problem has included hip x-rays and hip MRI. Daniil has been treated by Dr. Hulan Saas. He had a regular x-ray and MRI which showed a significant amount of early arthritis in the hip with a lot of edema in the joint and a large effusion. He said his hip is hurting all the time. It is hurting at day and night. It is getting progressively worse every day. This is limiting what he can and cannot do. He does not recall any previous injury to that hip. He has not had any problems with the hip prior to this episode for the past 4 months. He is not having any left hip pain. He is not having any back pain, lower extremity weakness or paresthesia. His plain x-rays from a few months ago and he still has a decent joint space left. No where near bone on bone. I reviewed his MRI and he has got a very impressive MRI. 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 He has very large effusion. He does have a pretty significant secondary degenerative  change in the hip. It is felt that he has developped a rapidly progressive osteoarthritis. I think that fragmentation is occurring with this rapidly progressive OA. At this point, we can get some short-term temporary relief with an intraarticular injection, but long-term the only thing is going to get him better is a total hip arthroplasty. He would like to proceed with surgery at this time. They have been treated conservatively in the past for the above stated problem and despite conservative measures, they continue to have progressive pain and severe functional limitations and dysfunction. They have failed non-operative management including home exercise, medications. It is felt that they would benefit from undergoing total joint replacement. Risks and benefits of the procedure have been discussed with the patient and they elect to proceed with surgery. There are no active contraindications to surgery such as ongoing infection or rapidly progressive neurological disease.  Problem List/Past Medical  Primary osteoarthritis of right hip (M16.11)  Heart murmur  Hypertension  Gastroesophageal Reflux Disease  Eczema  Allergies Penicillin V *PENICILLINS*  Cipro *FLUOROQUINOLONES*  Statins Peanuts  Seafood  Tomato  Corn (Syrup)   Family History First Degree Relatives  reported No pertinent family history   Social History  Children  2 Current work status  working full time Exercise  Exercises weekly; does running / walking, individual sport and gym / [employer] Marital status  divorced Never consumed alcohol  01/23/2016: Never consumed alcohol No history of drug/alcohol rehab  Not under pain  contract  Number of flights of stairs before winded  greater than 5 Tobacco / smoke exposure  01/23/2016: no Tobacco use  Never smoker. 01/23/2016 Advance Directives  Living Will, Healthcare POA  Medication History  Azor (Oral) Specific strength unknown - Active. Protonix  (Oral) Specific strength unknown - Active. Cialis (Oral) Specific strength unknown - Active.  Past Surgical History  Tonsillectomy  Kidney Stone Surgery  Date: 1995.   Review of Systems General Not Present- Chills, Fatigue, Fever, Memory Loss, Night Sweats, Weight Gain and Weight Loss. Skin Not Present- Eczema, Hives, Itching, Lesions and Rash. HEENT Not Present- Dentures, Double Vision, Headache, Hearing Loss, Tinnitus and Visual Loss. Respiratory Not Present- Allergies, Chronic Cough, Coughing up blood, Shortness of breath at rest and Shortness of breath with exertion. Cardiovascular Not Present- Chest Pain, Difficulty Breathing Lying Down, Murmur, Palpitations, Racing/skipping heartbeats and Swelling. Gastrointestinal Not Present- Abdominal Pain, Bloody Stool, Constipation, Diarrhea, Difficulty Swallowing, Heartburn, Jaundice, Loss of appetitie, Nausea and Vomiting. Male Genitourinary Not Present- Blood in Urine, Discharge, Flank Pain, Incontinence, Painful Urination, Urgency, Urinary frequency, Urinary Retention, Urinating at Night and Weak urinary stream. Musculoskeletal Present- Joint Pain. Not Present- Back Pain, Joint Swelling, Morning Stiffness, Muscle Pain, Muscle Weakness and Spasms. Neurological Not Present- Blackout spells, Difficulty with balance, Dizziness, Paralysis, Tremor and Weakness. Psychiatric Not Present- Insomnia.  Vitals  Weight: 216 lb Height: 74in Weight was reported by patient. Body Surface Area: 2.25 m Body Mass Index: 27.73 kg/m  Pulse: 84 (Regular)  BP: 128/78 (Sitting, Left Arm, Standard)  Physical Exam General Mental Status -Alert, cooperative and good historian. General Appearance-pleasant, Not in acute distress. Orientation-Oriented X3. Build & Nutrition-Well nourished and Well developed.  Head and Neck Head-normocephalic, atraumatic . Neck Global Assessment - supple, no bruit auscultated on the right, no bruit  auscultated on the left.  Eye Vision-Wears contact lenses. Pupil - Bilateral-Regular and Round. Motion - Bilateral-EOMI.  Chest and Lung Exam Auscultation Breath sounds - clear at anterior chest wall and clear at posterior chest wall. Adventitious sounds - No Adventitious sounds.  Cardiovascular Auscultation Rhythm - Regular rate and rhythm. Heart Sounds - S1 WNL and S2 WNL. Murmurs & Other Heart Sounds - Auscultation of the heart reveals - No Murmurs.  Abdomen Palpation/Percussion Tenderness - Abdomen is non-tender to palpation. Rigidity (guarding) - Abdomen is soft. Auscultation Auscultation of the abdomen reveals - Bowel sounds normal.  Musculoskeletal Note: Well-developed male, alert and oriented, in no apparent distress. Evaluation of his right hip shows flexion about 90, minimal internal rotation, only about 10 degrees of external rotation and 10 to 20 degrees of abduction. He has significant pain on range of motion of that hip. Left hip flexion 130, rotation in 30, out 40, abduction 40 without pain. He has a significantly antalgic gait pattern on the right.  I reviewed his plain x-rays from a few months ago and he still has a decent joint space left. No where near bone on bone. I reviewed his MRI and he has got a very impressive MRI. 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 He has very large effusion. He does have a pretty significant secondary degenerative change in the hip.  Assessment & Plan Primary osteoarthritis of right hip (M16.11)  Note:Surgical Plans: Right Total Hip Replacement - Anterior Approach  Disposition: Home with help from a caregiver, Start HHPT following the hospital stay.  PCP: Dr. Alain Marion - Patient has been seen preoperatively and felt to  be stable for surgery.  IV TXA  Anesthesia Issues: Very difficult to wake up following one kidney stone surgery, but has had a couple of kidney  stone srugeries since and did fine.  Patient was instructed on what medications to stop prior to surgery.  Signed electronically by Ok Edwards, III PA-C

## 2016-04-30 NOTE — Anesthesia Postprocedure Evaluation (Signed)
Anesthesia Post Note  Patient: Taris D Martinique  Procedure(s) Performed: Procedure(s) (LRB): RIGHT TOTAL HIP ARTHROPLASTY ANTERIOR APPROACH (Right)  Patient location during evaluation: PACU Anesthesia Type: Spinal Level of consciousness: oriented and awake and alert Pain management: pain level controlled Vital Signs Assessment: post-procedure vital signs reviewed and stable Respiratory status: spontaneous breathing, respiratory function stable and patient connected to nasal cannula oxygen Cardiovascular status: blood pressure returned to baseline and stable Postop Assessment: no headache, no backache, spinal receding, no signs of nausea or vomiting and patient able to bend at knees Anesthetic complications: no       Last Vitals:  Vitals:   04/30/16 1612 04/30/16 1708  BP: (!) 145/90 (!) 160/79  Pulse: (!) 102 (!) 110  Resp: 16 16  Temp: 36.7 C 36.9 C    Last Pain:  Vitals:   04/30/16 1708  TempSrc: Oral  PainSc:                  Catalina Gravel

## 2016-04-30 NOTE — Op Note (Signed)
OPERATIVE REPORT- TOTAL HIP ARTHROPLASTY   PREOPERATIVE DIAGNOSIS: Osteoarthritis of the Right hip.   POSTOPERATIVE DIAGNOSIS: Osteoarthritis of the Right  hip.   PROCEDURE: Right total hip arthroplasty, anterior approach.   SURGEON: Gaynelle Arabian, MD   ASSISTANT: Arlee Muslim, PA-C  ANESTHESIA:  Spinal  ESTIMATED BLOOD LOSS:- 550 ml   DRAINS: Hemovac x1.   COMPLICATIONS: None   CONDITION: PACU - hemodynamically stable.   BRIEF CLINICAL NOTE: Corey [name] is a 67 y.o. male who has advanced end-  stage arthritis of their Right  hip with progressively worsening pain and  dysfunction.The patient has failed nonoperative management and presents for  total hip arthroplasty.   PROCEDURE IN DETAIL: After successful administration of spinal  anesthetic, the traction boots for the [facility] bed were placed on both  feet and the patient was placed onto the [facility] bed, boots placed into the leg  holders. The Right hip was then isolated from the perineum with plastic  drapes and prepped and draped in the usual sterile fashion. ASIS and  greater trochanter were marked and a oblique incision was made, starting  at about 1 cm lateral and 2 cm distal to the ASIS and coursing towards  the anterior cortex of the femur. The skin was cut with a 10 blade  through subcutaneous tissue to the level of the fascia overlying the  tensor fascia lata muscle. The fascia was then incised in line with the  incision at the junction of the anterior third and posterior 2/3rd. The  muscle was teased off the fascia and then the interval between the TFL  and the rectus was developed. The Hohmann retractor was then placed at  the top of the femoral neck over the capsule. The vessels overlying the  capsule were cauterized and the fat on top of the capsule was removed.  A Hohmann retractor was then placed anterior underneath the rectus  femoris to give exposure to the entire anterior capsule. A T-shaped   capsulotomy was performed. The edges were tagged and the femoral head  was identified.       Osteophytes are removed off the superior acetabulum.  The femoral neck was then cut in situ with an oscillating saw. Traction  was then applied to the left lower extremity utilizing the [facility]  traction. The femoral head was then removed. Retractors were placed  around the acetabulum and then circumferential removal of the labrum was  performed. Osteophytes were also removed. Reaming starts at 47 mm to  medialize and  Increased in 2 mm increments to 55 mm. We reamed in  approximately 40 degrees of abduction, 20 degrees anteversion. A 56 mm  pinnacle acetabular shell was then impacted in anatomic position under  fluoroscopic guidance with excellent purchase. We did not need to place  any additional dome screws. A 36 mm neutral + 4 marathon liner was then  placed into the acetabular shell.       The femoral lift was then placed along the lateral aspect of the femur  just distal to the vastus ridge. The leg was  externally rotated and capsule  was stripped off the inferior aspect of the femoral neck down to the  level of the lesser trochanter, this was done with electrocautery. The femur was lifted after this was performed. The  leg was then placed in an extended and adducted position essentially delivering the femur. We also removed the capsule superiorly and the piriformis from the piriformis  fossa to gain excellent exposure of the  proximal femur. Rongeur was used to remove some cancellous bone to get  into the lateral portion of the proximal femur for placement of the  initial starter reamer. The starter broaches was placed  the starter broach  and was shown to go down the center of the canal. Broaching  with the  Corail system was then performed starting at size 8, coursing  Up to size 15. A size 15 had excellent torsional and rotational  and axial stability. The trial high offset neck was then  placed  with a 36 + 8.5 trial head. The hip was then reduced. We confirmed that  the stem was in the canal both on AP and lateral x-rays. It also has excellent sizing. The hip was reduced with outstanding stability through full extension and full external rotation.. AP pelvis was taken and the leg lengths were measured and found to be equal. Hip was then dislocated again and the femoral head and neck removed. The  femoral broach was removed. Size 15 Corail stem with a high offset  neck was then impacted into the femur following native anteversion. Has  excellent purchase in the canal. Excellent torsional and rotational and  axial stability. It is confirmed to be in the canal on AP and lateral  fluoroscopic views. The 36 + 8.5 ceramic head was placed and the hip  reduced with outstanding stability. Again AP pelvis was taken and it  confirmed that the leg lengths were equal. The wound was then copiously  irrigated with saline solution and the capsule reattached and repaired  with Ethibond suture. 30 ml of .25% Bupivicaine was  injected into the capsule and into the edge of the tensor fascia lata as well as subcutaneous tissue. The fascia overlying the tensor fascia lata was then closed with a running #1 V-Loc. Subcu was closed with interrupted 2-0 Vicryl and subcuticular running 4-0 Monocryl. Incision was cleaned  and dried. Steri-Strips and a bulky sterile dressing applied. Hemovac  drain was hooked to suction and then the patient was awakened and transported to  recovery in stable condition.        Please note that a surgical assistant was a medical necessity for this procedure to perform it in a safe and expeditious manner. Assistant was necessary to provide appropriate retraction of vital neurovascular structures and to prevent femoral fracture and allow for anatomic placement of the prosthesis.  Gaynelle Arabian, M.D.

## 2016-04-30 NOTE — Interval H&P Note (Signed)
History and Physical Interval Note:  04/30/2016 10:03 AM  Corey [name]  has presented today for surgery, with the diagnosis of Osteoarthritis Right hip   The various methods of treatment have been discussed with the patient and family. After consideration of risks, benefits and other options for treatment, the patient has consented to  Procedure(s) with comments: [name] (Right) - requests 171mins as a surgical intervention .  The patient's history has been reviewed, patient examined, no change in status, stable for surgery.  I have reviewed the patient's chart and labs.  Questions were answered to the patient's satisfaction.     Gearlean Alf

## 2016-04-30 NOTE — Progress Notes (Addendum)
Nurse paged provider on call at [facility]. Asencion Partridge returned call to nurse. Agrees to change IV pain medication from Morphine to Dilaudid. Nurse accepted verbal order from [facility] for Dilaudid 2mg  IV every 3 hours as needed for severe pain. Nurse will enter verbal order.   Morphine order discontinued. Dilaudid order entered.

## 2016-04-30 NOTE — Transfer of Care (Signed)
Immediate Anesthesia Transfer of Care Note  Patient: [name]  Procedure(s) Performed: Procedure(s) with comments: RIGHT TOTAL HIP ARTHROPLASTY ANTERIOR APPROACH (Right) - requests 172mins  Patient Location: PACU  Anesthesia Type:Regional  Level of Consciousness: awake and alert   Airway & Oxygen Therapy: Patient Spontanous Breathing and Patient connected to face mask oxygen  Post-op Assessment: Report given to RN and Post -op Vital signs reviewed and stable  Post vital signs: Reviewed and stable  Last Vitals:  Vitals:   04/30/16 0740  BP: (!) 151/79  Pulse: 93  Resp: 18  Temp: 36.9 C    Last Pain:  Vitals:   04/30/16 0831  TempSrc:   PainSc: 3       Patients Stated Pain Goal: 4 (86/16/83 7290)  Complications: No apparent anesthesia complications

## 2016-05-01 LAB — BASIC METABOLIC PANEL
ANION GAP: 9 (ref 5–15)
BUN: 15 mg/dL (ref 6–20)
CHLORIDE: 102 mmol/L (ref 101–111)
CO2: 25 mmol/L (ref 22–32)
Calcium: 8.7 mg/dL — ABNORMAL LOW (ref 8.9–10.3)
Creatinine, Ser: 1.04 mg/dL (ref 0.61–1.24)
GFR calc non Af Amer: >60 mL/min (ref >60)
Glucose, Bld: 140 mg/dL — ABNORMAL HIGH (ref 65–99)
POTASSIUM: 4.2 mmol/L (ref 3.5–5.1)
Sodium: 136 mmol/L (ref 135–145)

## 2016-05-01 LAB — CBC
HEMATOCRIT: 35.8 % — AB (ref 39.0–52.0)
HEMOGLOBIN: 12.1 g/dL — AB (ref 13.0–17.0)
MCH: 29.2 pg (ref 26.0–34.0)
MCHC: 33.8 g/dL (ref 30.0–36.0)
MCV: 86.3 fL (ref 78.0–100.0)
Platelets: 199 10*3/uL (ref 150–400)
RBC: 4.15 MIL/uL — ABNORMAL LOW (ref 4.22–5.81)
RDW: 12.8 % (ref 11.5–15.5)
WBC: 15.1 10*3/uL — ABNORMAL HIGH (ref 4.0–10.5)

## 2016-05-01 LAB — TYPE AND SCREEN
ABO/RH(D): B NEG
Antibody Screen: NEGATIVE

## 2016-05-01 MED ORDER — METHOCARBAMOL 500 MG PO TABS: 500.0000 mg | ORAL_TABLET | Freq: Four times a day (QID) | ORAL | 0 refills | Status: DC | PRN

## 2016-05-01 MED ORDER — TRAMADOL HCL 50 MG PO TABS: 50.0000 mg | ORAL_TABLET | Freq: Four times a day (QID) | ORAL | 0 refills | Status: DC | PRN

## 2016-05-01 MED ORDER — RIVAROXABAN 10 MG PO TABS: 10.0000 mg | ORAL_TABLET | Freq: Every day | ORAL | 0 refills | Status: DC

## 2016-05-01 MED ORDER — OXYCODONE HCL 5 MG PO TABS: 5.0000 mg | ORAL_TABLET | ORAL | 0 refills | Status: DC | PRN

## 2016-05-01 NOTE — Progress Notes (Signed)
   Subjective: 1 Day Post-Op Procedure(s) (LRB): RIGHT TOTAL HIP ARTHROPLASTY ANTERIOR APPROACH (Right) Patient reports pain as mild and moderate last night. Little better this morning. Patient seen in rounds with Dr. Wynelle Link. Patient is well, but has had some minor complaints of pain in the hip, requiring pain medications We will start therapy today.  If they do well with therapy and meets all goals, then will allow home later this afternoon following therapy.  Possible home later this afternoon. Plan is to go Home after hospital stay.  Objective: Vital signs in last 24 hours: Temp:  [97.4 F (36.3 C)-99 F (37.2 C)] 98.2 F (36.8 C) (03/08 0458) Pulse Rate:  [83-110] 101 (03/08 0458) Resp:  [12-16] 16 (03/08 0458) BP: (119-160)/(72-91) 148/84 (03/08 0819) SpO2:  [96 %-100 %] 100 % (03/08 0458) Weight:  [99.3 kg (219 lb)] 99.3 kg (219 lb) (03/07 1400)  Intake/Output from previous day:  Intake/Output Summary (Last 24 hours) at 05/01/16 0834 Last data filed at 05/01/16 0459  Gross per 24 hour  Intake          4088.33 ml  Output             3370 ml  Net           718.33 ml    Intake/Output this shift: No intake/output data recorded.  Labs:  Recent Labs  05/01/16 0437  HGB 12.1*    Recent Labs  05/01/16 0437  WBC 15.1*  RBC 4.15*  HCT 35.8*  PLT 199    Recent Labs  05/01/16 0437  NA 136  K 4.2  CL 102  CO2 25  BUN 15  CREATININE 1.04  GLUCOSE 140*  CALCIUM 8.7*   No results for input(s): LABPT, INR in the last 72 hours.  EXAM General - Patient is Alert, Appropriate and Oriented Extremity - Neurovascular intact Sensation intact distally Intact pulses distally Dorsiflexion/Plantar flexion intact Dressing - dressing C/D/I Motor Function - intact, moving foot and toes well on exam.  Hemovac pulled without difficulty.  Past Medical History:  Diagnosis Date  . Allergic rhinitis   . Arthritis   . Asthma   . Barrett's esophagus   . Complication of  anesthesia    "took them 6 hours to wake me up"  . GERD (gastroesophageal reflux disease)   . Heart murmur   . History of kidney stones   . HTN (hypertension)   . Hyperlipidemia   . Nephrolithiasis   . Pneumonia    as a kid    Assessment/Plan: 1 Day Post-Op Procedure(s) (LRB): RIGHT TOTAL HIP ARTHROPLASTY ANTERIOR APPROACH (Right) Principal Problem:   OA (osteoarthritis) of hip  Estimated body mass index is 28.89 kg/m as calculated from the following:   Height as of this encounter: 6\' 1"  (1.854 m).   Weight as of this encounter: 99.3 kg (219 lb). Up with therapy Discharge home with home health  DVT Prophylaxis - Xarelto Weight Bearing As Tolerated right Leg Hemovac Pulled Begin Therapy  If meets goals and able to go home: Discharge home with home health if does well with therapy Diet - Cardiac diet Follow up - in 2 weeks Activity - WBAT Disposition - Home Condition Upon Discharge - pending therapy D/C Meds - See DC Summary DVT Prophylaxis - Xarelto  Arlee Muslim, PA-C Orthopaedic Surgery 05/01/2016, 8:34 AM

## 2016-05-01 NOTE — Progress Notes (Signed)
Physical Therapy Treatment Patient Details Name: Corey [name] MRN: [id number] DOB: 1950/11/12 Today's Date: 05/01/2016    History of Present Illness Pt s/p R THR    PT Comments    Pt progressing well with mobility and eager for dc home.  Reviewed stairs and car transfers with pt and caregiver.   Follow Up Recommendations  Home health PT     Equipment Recommendations  None recommended by PT    Recommendations for Other Services OT consult     Precautions / Restrictions Precautions Precautions: Fall Restrictions Weight Bearing Restrictions: No Other Position/Activity Restrictions: WBAT    Mobility  Bed Mobility Overal bed mobility: Needs Assistance Bed Mobility: Supine to Sit     Supine to sit: Min guard     General bed mobility comments: cues for sequence and use of L LE to self assist  Transfers Overall transfer level: Needs assistance Equipment used: Rolling walker (2 wheeled) Transfers: Sit to/from Stand Sit to Stand: Supervision         General transfer comment: cues for LE management and use of UEs to self assist  Ambulation/Gait Ambulation/Gait assistance: Min guard;Supervision Ambulation Distance (Feet): 350 Feet Assistive device: Rolling walker (2 wheeled) Gait Pattern/deviations: Step-to pattern;Step-through pattern;Decreased step length - right;Decreased step length - left;Shuffle;Trunk flexed Gait velocity: decr Gait velocity interpretation: Below normal speed for age/gender General Gait Details: cues for posture, position from RW and initial sequence   Stairs Stairs: Yes   Stair Management: No rails;One rail Left;Step to pattern;With walker;With cane Number of Stairs: 8 General stair comments: 4 stairs bkwd with RW and 4 stairs with crutch and rail.  Cues for sequence and foot/cane/RW placement.  Caregiver present and assisting  Wheelchair Mobility    Modified Rankin (Stroke Patients Only)       Balance                                    Cognition Arousal/Alertness: Awake/alert Behavior During Therapy: WFL for tasks assessed/performed Overall Cognitive Status: Within Functional Limits for tasks assessed                      Exercises      General Comments        Pertinent Vitals/Pain Pain Assessment: 0-10 Pain Score: 3  Pain Location: R hip Pain Descriptors / Indicators: Aching;Sore Pain Intervention(s): Limited activity within patient's tolerance;Monitored during session;Premedicated before session;Ice applied    Home Living                      Prior Function            PT Goals (current goals can now be found in the care plan section) Acute Rehab PT Goals Patient Stated Goal: Regain IND PT Goal Formulation: With patient Time For Goal Achievement: 05/05/16 Potential to Achieve Goals: Good Progress towards PT goals: Progressing toward goals    Frequency    7X/week      PT Plan Current plan remains appropriate    Co-evaluation             End of Session Equipment Utilized During Treatment: Gait belt Activity Tolerance: Patient tolerated treatment well Patient left: in chair;with call bell/phone within reach;with family/visitor present Nurse Communication: Mobility status PT Visit Diagnosis: Difficulty in walking, not elsewhere classified (R26.2)     Time: 2505-3976 PT Time Calculation (min) (ACUTE ONLY):  35 min  Charges:  $Gait Training: 8-22 mins $Therapeutic Activity: 8-22 mins                    G Codes:       Corey [name] 24-May-2016, 5:04 PM

## 2016-05-01 NOTE — Progress Notes (Signed)
OT Cancellation Note  Patient Details Name: Corey [name] MRN: [id number] DOB: 04-27-1950   Cancelled Treatment:    Reason Eval/Treat Not Completed: Other (comment) Pt throwing up first time OT checked on him and sleeping soundly second time.  Will check on pt later this day or next day  Kari Baars, [place]  Payton Mccallum D 05/01/2016, 1:16 PM

## 2016-05-01 NOTE — Discharge Summary (Signed)
Physician Discharge Summary   Patient ID: Corey [name] MRN: [id number] DOB/AGE: January 17, 1951 66 y.o.  Admit date: 04/30/2016 Discharge date: 05/01/2016  Primary Diagnosis:  Osteoarthritis of the Right hip.   Admission Diagnoses:  Past Medical History:  Diagnosis Date  . Allergic rhinitis   . Arthritis   . Asthma   . Barrett's esophagus   . Complication of anesthesia    "took them 6 hours to wake me up"  . GERD (gastroesophageal reflux disease)   . Heart murmur   . History of kidney stones   . HTN (hypertension)   . Hyperlipidemia   . Nephrolithiasis   . Pneumonia    as a kid   Discharge Diagnoses:   Principal Problem:   OA (osteoarthritis) of hip  Estimated body mass index is 28.89 kg/m as calculated from the following:   Height as of this encounter: '6\' 1"'  (1.854 m).   Weight as of this encounter: 99.3 kg (219 lb).  Procedure(s) (LRB): RIGHT TOTAL HIP ARTHROPLASTY ANTERIOR APPROACH (Right)   Consults: None  HPI: Corey [name] is a 66 y.o. male who has advanced end-  stage arthritis of their Right  hip with progressively worsening pain and  dysfunction.The patient has failed nonoperative management and presents for  total hip arthroplasty.  Laboratory Data: Admission on 04/30/2016  Component Date Value Ref Range Status  . WBC 05/01/2016 15.1* 4.0 - 10.5 K/uL Final  . RBC 05/01/2016 4.15* 4.22 - 5.81 MIL/uL Final  . Hemoglobin 05/01/2016 12.1* 13.0 - 17.0 g/dL Final  . HCT 05/01/2016 35.8* 39.0 - 52.0 % Final  . MCV 05/01/2016 86.3  78.0 - 100.0 fL Final  . MCH 05/01/2016 29.2  26.0 - 34.0 pg Final  . MCHC 05/01/2016 33.8  30.0 - 36.0 g/dL Final  . RDW 05/01/2016 12.8  11.5 - 15.5 % Final  . Platelets 05/01/2016 199  150 - 400 K/uL Final  . Sodium 05/01/2016 136  135 - 145 mmol/L Final  . Potassium 05/01/2016 4.2  3.5 - 5.1 mmol/L Final  . Chloride 05/01/2016 102  101 - 111 mmol/L Final  . CO2 05/01/2016 25  22 - 32 mmol/L Final  . Glucose, Bld 05/01/2016  140* 65 - 99 mg/dL Final  . BUN 05/01/2016 15  6 - 20 mg/dL Final  . Creatinine, Ser 05/01/2016 1.04  0.61 - 1.24 mg/dL Final  . Calcium 05/01/2016 8.7* 8.9 - 10.3 mg/dL Final  . GFR calc non Af Amer 05/01/2016 >60  >60 mL/min Final  . GFR calc Af Amer 05/01/2016 >60  >60 mL/min Final   Comment: (NOTE) The eGFR has been calculated using the CKD EPI equation. This calculation has not been validated in all clinical situations. eGFR's persistently <60 mL/min signify possible Chronic Kidney Disease.   Georgiann Hahn gap 05/01/2016 9  5 - 15 Final  Hospital Outpatient Visit on 04/25/2016  Component Date Value Ref Range Status  . aPTT 04/25/2016 32  24 - 36 seconds Final  . WBC 04/25/2016 5.7  4.0 - 10.5 K/uL Final  . RBC 04/25/2016 4.78  4.22 - 5.81 MIL/uL Final  . Hemoglobin 04/25/2016 14.3  13.0 - 17.0 g/dL Final  . HCT 04/25/2016 41.6  39.0 - 52.0 % Final  . MCV 04/25/2016 87.0  78.0 - 100.0 fL Final  . MCH 04/25/2016 29.9  26.0 - 34.0 pg Final  . MCHC 04/25/2016 34.4  30.0 - 36.0 g/dL Final  . RDW 04/25/2016 12.8  11.5 - 15.5 %  Final  . Platelets 04/25/2016 235  150 - 400 K/uL Final  . Sodium 04/25/2016 138  135 - 145 mmol/L Final  . Potassium 04/25/2016 4.0  3.5 - 5.1 mmol/L Final  . Chloride 04/25/2016 104  101 - 111 mmol/L Final  . CO2 04/25/2016 26  22 - 32 mmol/L Final  . Glucose, Bld 04/25/2016 99  65 - 99 mg/dL Final  . BUN 04/25/2016 15  6 - 20 mg/dL Final  . Creatinine, Ser 04/25/2016 1.02  0.61 - 1.24 mg/dL Final  . Calcium 04/25/2016 9.7  8.9 - 10.3 mg/dL Final  . Total Protein 04/25/2016 8.4* 6.5 - 8.1 g/dL Final  . Albumin 04/25/2016 4.8  3.5 - 5.0 g/dL Final  . AST 04/25/2016 47* 15 - 41 U/L Final  . ALT 04/25/2016 62  17 - 63 U/L Final  . Alkaline Phosphatase 04/25/2016 102  38 - 126 U/L Final  . Total Bilirubin 04/25/2016 0.9  0.3 - 1.2 mg/dL Final  . GFR calc non Af Amer 04/25/2016 >60  >60 mL/min Final  . GFR calc Af Amer 04/25/2016 >60  >60 mL/min Final   Comment:  (NOTE) The eGFR has been calculated using the CKD EPI equation. This calculation has not been validated in all clinical situations. eGFR's persistently <60 mL/min signify possible Chronic Kidney Disease.   . Anion gap 04/25/2016 8  5 - 15 Final  . Prothrombin Time 04/25/2016 12.6  11.4 - 15.2 seconds Final  . INR 04/25/2016 0.95   Final  . ABO/RH(D) 04/25/2016 B NEG   Final  . Antibody Screen 04/25/2016 NEG   Final  . Sample Expiration 04/25/2016 05/04/2016   Final  . Extend sample reason 04/25/2016 NO TRANSFUSIONS OR PREGNANCY IN THE PAST 3 MONTHS   Final  . MRSA, PCR 04/25/2016 NEGATIVE  NEGATIVE Final  . Staphylococcus aureus 04/25/2016 NEGATIVE  NEGATIVE Final   Comment:        The Xpert SA Assay (FDA approved for NASAL specimens in patients over 20 years of age), is one component of a comprehensive surveillance program.  Test performance has been validated by [facility] for patients greater than or equal to 26 year old. It is not intended to diagnose infection nor to guide or monitor treatment.   . ABO/RH(D) 04/25/2016 B NEG   Final  Appointment on 03/28/2016  Component Date Value Ref Range Status  . Sodium 03/28/2016 139  135 - 145 mEq/L Final  . Potassium 03/28/2016 4.1  3.5 - 5.1 mEq/L Final  . Chloride 03/28/2016 103  96 - 112 mEq/L Final  . CO2 03/28/2016 27  19 - 32 mEq/L Final  . Glucose, Bld 03/28/2016 109* 70 - 99 mg/dL Final  . BUN 03/28/2016 15  6 - 23 mg/dL Final  . Creatinine, Ser 03/28/2016 1.09  0.40 - 1.50 mg/dL Final  . Calcium 03/28/2016 9.5  8.4 - 10.5 mg/dL Final  . GFR 03/28/2016 72.57  >60.00 mL/min Final  . WBC 03/28/2016 5.6  4.0 - 10.5 K/uL Final  . RBC 03/28/2016 4.71  4.22 - 5.81 Mil/uL Final  . Hemoglobin 03/28/2016 14.0  13.0 - 17.0 g/dL Final  . HCT 03/28/2016 42.1  39.0 - 52.0 % Final  . MCV 03/28/2016 89.5  78.0 - 100.0 fl Final  . MCHC 03/28/2016 33.3  30.0 - 36.0 g/dL Final  . RDW 03/28/2016 13.8  11.5 - 15.5 % Final  . Platelets  03/28/2016 226.0  150.0 - 400.0 K/uL Final  . Neutrophils  Relative % 03/28/2016 69.8  43.0 - 77.0 % Final  . Lymphocytes Relative 03/28/2016 17.6  12.0 - 46.0 % Final  . Monocytes Relative 03/28/2016 10.5  3.0 - 12.0 % Final  . Eosinophils Relative 03/28/2016 1.7  0.0 - 5.0 % Final  . Basophils Relative 03/28/2016 0.4  0.0 - 3.0 % Final  . Neutro Abs 03/28/2016 3.9  1.4 - 7.7 K/uL Final  . Lymphs Abs 03/28/2016 1.0  0.7 - 4.0 K/uL Final  . Monocytes Absolute 03/28/2016 0.6  0.1 - 1.0 K/uL Final  . Eosinophils Absolute 03/28/2016 0.1  0.0 - 0.7 K/uL Final  . Basophils Absolute 03/28/2016 0.0  0.0 - 0.1 K/uL Final  . Total Bilirubin 03/28/2016 0.7  0.2 - 1.2 mg/dL Final  . Bilirubin, Direct 03/28/2016 0.2  0.0 - 0.3 mg/dL Final  . Alkaline Phosphatase 03/28/2016 96  39 - 117 U/L Final  . AST 03/28/2016 40* 0 - 37 U/L Final  . ALT 03/28/2016 59* 0 - 53 U/L Final  . Total Protein 03/28/2016 7.3  6.0 - 8.3 g/dL Final  . Albumin 03/28/2016 4.4  3.5 - 5.2 g/dL Final  . Cholesterol 03/28/2016 238* 0 - 200 mg/dL Final  . Triglycerides 03/28/2016 194.0* 0.0 - 149.0 mg/dL Final  . HDL 03/28/2016 46.10  >39.00 mg/dL Final  . VLDL 03/28/2016 38.8  0.0 - 40.0 mg/dL Final  . LDL Cholesterol 03/28/2016 153* 0 - 99 mg/dL Final  . Total CHOL/HDL Ratio 03/28/2016 5   Final  . NonHDL 03/28/2016 191.59   Final  . PSA 03/28/2016 0.98  0.10 - 4.00 ng/mL Final  . TSH 03/28/2016 1.13  0.35 - 4.50 uIU/mL Final  . Color, Urine 03/28/2016 YELLOW  Yellow;Lt. Yellow Final  . APPearance 03/28/2016 CLEAR  Clear Final  . Specific Gravity, Urine 03/28/2016 1.020  1.000 - 1.030 Final  . pH 03/28/2016 6.0  5.0 - 8.0 Final  . Total Protein, Urine 03/28/2016 NEGATIVE  Negative Final  . Urine Glucose 03/28/2016 NEGATIVE  Negative Final  . Ketones, ur 03/28/2016 NEGATIVE  Negative Final  . Bilirubin Urine 03/28/2016 NEGATIVE  Negative Final  . Hgb urine dipstick 03/28/2016 NEGATIVE  Negative Final  . Urobilinogen, UA  03/28/2016 0.2  0.0 - 1.0 Final  . Leukocytes, UA 03/28/2016 NEGATIVE  Negative Final  . Nitrite 03/28/2016 NEGATIVE  Negative Final     X-Rays:Dg Pelvis Portable  Result Date: 04/30/2016 CLINICAL DATA:  Status post right hip replacement EXAM: PORTABLE PELVIS 1-2 VIEWS COMPARISON:  None. FINDINGS: Right hip prosthesis is noted. Mild degenerative changes of the left hip are noted. No acute fracture or dislocation is seen. No soft tissue abnormality is noted. IMPRESSION: Status post right hip replacement Electronically Signed   By: Inez Catalina M.D.   On: 04/30/2016 13:19   Dg C-arm 1-60 Min-no Report  Result Date: 04/30/2016 Fluoroscopy was utilized by the requesting physician.  No radiographic interpretation.    EKG: Orders placed or performed in visit on 03/25/16  . EKG 12-Lead     Hospital Course: Patient was admitted to [facility] and taken to the OR and underwent the above state procedure without complications.  Patient tolerated the procedure well and was later transferred to the recovery room and then to the orthopaedic floor for postoperative care.  Corey [name] were given PO and IV analgesics for pain control following their surgery.  Corey [name] were given 24 hours of postoperative antibiotics of  Anti-infectives    Start  Dose/Rate Route Frequency Ordered Stop   04/30/16 2200  vancomycin (VANCOCIN) IVPB 1000 mg/200 mL premix     1,000 mg 200 mL/hr over 60 Minutes Intravenous Every 12 hours 04/30/16 1406 04/30/16 2250   04/30/16 0830  vancomycin (VANCOCIN) 1,500 mg in sodium chloride 0.9 % 500 mL IVPB     1,500 mg 250 mL/hr over 120 Minutes Intravenous  Once 04/30/16 0800 04/30/16 1210     and started on DVT prophylaxis in the form of Xarelto.   PT and OT were ordered for total hip protocol.  The patient was allowed to be WBAT with therapy. Discharge planning was consulted to help with postop disposition and equipment needs.  Patient had a good night on the evening of surgery.  Corey [name]  started to get up OOB with therapy on day one.  Hemovac drain was pulled without difficulty.  Patient was seen in rounds on POD 1 and it was felt as long as Corey [name] did well therapy that Corey [name] would be ready to go home.  Progressed with PT and was ready to go home.  Discharge home with home health Diet - Cardiac diet Follow up - in 2 weeks Activity - WBAT Disposition - Home Condition Upon Discharge - improving D/C Meds - See DC Summary DVT Prophylaxis - Xarelto   Discharge Instructions    Call MD / Call 911    Complete by:  As directed    If you experience chest pain or shortness of breath, CALL 911 and be transported to the hospital emergency room.  If you develope a fever above 101 F, pus (white drainage) or increased drainage or redness at the wound, or calf pain, call your surgeon's office.   Change dressing    Complete by:  As directed    You may change your dressing dressing daily with sterile 4 x 4 inch gauze dressing and paper tape.  Do not submerge the incision under water.   Constipation Prevention    Complete by:  As directed    Drink plenty of fluids.  Prune juice may be helpful.  You may use a stool softener, such as Colace (over the counter) 100 mg twice a day.  Use MiraLax (over the counter) for constipation as needed.   Diet - low sodium heart healthy    Complete by:  As directed    Discharge instructions    Complete by:  As directed    Pick up stool softner and laxative for home use following surgery while on pain medications. Do not submerge incision under water. Please use good hand washing techniques while changing dressing each day. May shower starting three days after surgery. Please use a clean towel to pat the incision dry following showers. Continue to use ice for pain and swelling after surgery. Do not use any lotions or creams on the incision until instructed by your surgeon.  Wear both TED hose on both legs during the day every day for three weeks, but may  have off at night at home.  Postoperative Constipation Protocol  Constipation - defined medically as fewer than three stools per week and severe constipation as less than one stool per week.  One of the most common issues patients have following surgery is constipation.  Even if you have a regular bowel pattern at home, your normal regimen is likely to be disrupted due to multiple reasons following surgery.  Combination of anesthesia, postoperative narcotics, change in appetite and fluid intake all can affect your  bowels.  In order to avoid complications following surgery, here are some recommendations in order to help you during your recovery period.  Colace (docusate) - Pick up an over-the-counter form of Colace or another stool softener and take twice a day as long as you are requiring postoperative pain medications.  Take with a full glass of water daily.  If you experience loose stools or diarrhea, hold the colace until you stool forms back up.  If your symptoms do not get better within 1 week or if Corey [name] get worse, check with your doctor.  Dulcolax (bisacodyl) - Pick up over-the-counter and take as directed by the product packaging as needed to assist with the movement of your bowels.  Take with a full glass of water.  Use this product as needed if not relieved by Colace only.   MiraLax (polyethylene glycol) - Pick up over-the-counter to have on hand.  MiraLax is a solution that will increase the amount of water in your bowels to assist with bowel movements.  Take as directed and can mix with a glass of water, juice, soda, coffee, or tea.  Take if you go more than two days without a movement. Do not use MiraLax more than once per day. Call your doctor if you are still constipated or irregular after using this medication for 7 days in a row.  If you continue to have problems with postoperative constipation, please contact the office for further assistance and recommendations.  If you experience  "the worst abdominal pain ever" or develop nausea or vomiting, please contact the office immediatly for further recommendations for treatment.   Take Xarelto for two and a half more weeks, then discontinue Xarelto. Once the patient has completed the blood thinner regimen, then take a Baby 81 mg Aspirin daily for three more weeks.   Do not sit on low chairs, stoools or toilet seats, as it may be difficult to get up from low surfaces    Complete by:  As directed    Driving restrictions    Complete by:  As directed    No driving until released by the physician.   Increase activity slowly as tolerated    Complete by:  As directed    Lifting restrictions    Complete by:  As directed    No lifting until released by the physician.   Patient may shower    Complete by:  As directed    You may shower without a dressing once there is no drainage.  Do not wash over the wound.  If drainage remains, do not shower until drainage stops.   TED hose    Complete by:  As directed    Use stockings (TED hose) for 3 weeks on both leg(s).  You may remove them at night for sleeping.   Weight bearing as tolerated    Complete by:  As directed    Laterality:  right   Extremity:  Lower     Allergies as of 05/01/2016      Reactions   Albuterol    REACTION: "feels like drowning"   Aspirin Other (See Comments)   Causes bruising   Ciprofloxacin    REACTION: rash   Clarithromycin    REACTION: swelling   Colesevelam    REACTION: constip   Enalapril Maleate    REACTION: Erectile dysfunction   Esomeprazole Magnesium    REACTION: epigastric pain   Niacin    REACTION: diarrhea   Pravastatin Sodium  REACTION: aching, listless   Rosuvastatin    REACTION: achy   Penicillins Hives   Has patient had a PCN reaction causing immediate rash, facial/tongue/throat swelling, SOB or lightheadedness with hypotension: Yes Has patient had a PCN reaction causing severe rash involving mucus membranes or skin necrosis:  Yes Has patient had a PCN reaction that required hospitalization No Has patient had a PCN reaction occurring within the last 10 years: No If all of the above answers are "NO", then may proceed with Cephalosporin use.      Medication List    STOP taking these medications   alendronate 70 MG tablet Commonly known as:  FOSAMAX   [employer] VIT D 5000 HIGH-POTENCY PO   cyclobenzaprine 5 MG tablet Commonly known as:  FLEXERIL   diazepam 5 MG tablet Commonly known as:  VALIUM   diclofenac sodium 1 % Gel Commonly known as:  VOLTAREN   diphenhydrAMINE 25 mg capsule Commonly known as:  BENADRYL   HYDROcodone-acetaminophen 5-325 MG tablet Commonly known as:  NORCO/VICODIN   meloxicam 15 MG tablet Commonly known as:  MOBIC   nortriptyline 10 MG capsule Commonly known as:  PAMELOR   Pramoxine-HC 1-2.5 % Oint Commonly known as:  PRAMOSONE   promethazine 12.5 MG tablet Commonly known as:  PHENERGAN   tadalafil 5 MG tablet Commonly known as:  CIALIS   Vitamin D (Ergocalciferol) 50000 units Caps capsule Commonly known as:  DRISDOL     TAKE these medications   acetaminophen 650 MG CR tablet Commonly known as:  TYLENOL Take 650 mg by mouth every 8 (eight) hours as needed for pain.   amLODipine-olmesartan 5-40 MG tablet Commonly known as:  AZOR Take 0.5 tablets by mouth daily.   cetirizine 10 MG tablet Commonly known as:  ZYRTEC Take 10 mg by mouth 2 (two) times daily. Takes twice daily   methocarbamol 500 MG tablet Commonly known as:  ROBAXIN Take 1 tablet (500 mg total) by mouth every 6 (six) hours as needed for muscle spasms.   oxyCODONE 5 MG immediate release tablet Commonly known as:  Oxy IR/ROXICODONE Take 1-2 tablets (5-10 mg total) by mouth every 4 (four) hours as needed for moderate pain or severe pain.   pantoprazole 40 MG tablet Commonly known as:  PROTONIX Take 1 tablet (40 mg total) by mouth daily. What changed:  how much to take  additional  instructions   rivaroxaban 10 MG Tabs tablet Commonly known as:  XARELTO Take 1 tablet (10 mg total) by mouth daily with breakfast. Take Xarelto for two and a half more weeks following discharge from the hospital, then discontinue Xarelto. Once the patient has completed the blood thinner regimen, then take a Baby 81 mg Aspirin daily for three more weeks. Start taking on:  05/02/2016   traMADol 50 MG tablet Commonly known as:  ULTRAM Take 1-2 tablets (50-100 mg total) by mouth every 6 (six) hours as needed for moderate pain. What changed:  how much to take  when to take this  reasons to take this      Follow-up Information    Gearlean Alf, MD. Schedule an appointment as soon as possible for a visit on 05/13/2016.   Specialty:  Orthopedic Surgery Contact information: [address] [place] [address] [phone]           Signed: Arlee Muslim, PA-C Orthopaedic Surgery 05/01/2016, 8:48 AM

## 2016-05-01 NOTE — Care Management Note (Signed)
Case Management Note  Patient Details  Name: Corey [name] MRN: [id number] Date of Birth: 05-18-1950  Subjective/Objective:                  RTHip Action/Plan: Discharge planning  Expected Discharge Date:  05/01/16               Expected Discharge Plan:  [place]  In-House Referral:     Discharge planning Services  CM Consult  Post Acute Care Choice:  Home Health Choice offered to:  Patient  DME Arranged:  3-N-1, Walker rolling DME Agency:  [place]:  PT [place] Agency:  [facility] ([facility])  Status of Service:  Completed, signed off  If discussed at [employer], dates discussed:    Additional Comments: CM met with pt in room to offer choice of home health agency. Pt chooses [facility] to render HHPT. Referral given to [facility] rep, Tim. CM notified [place] DME rep, Joelene Millin to please deliver the rolling walker and 3n1 to room prior to discharge. No other CM needs were communicated. Dellie Catholic, RN 05/01/2016, 11:33 AM

## 2016-05-01 NOTE — Evaluation (Signed)
Physical Therapy Evaluation Patient Details Name: Corey [name] MRN: [id number] DOB: 12-Oct-1950 Today's Date: 05/01/2016   History of Present Illness  Pt s/p R THR  Clinical Impression  Pt s/p R THR and presents with decreased R LE strength/ROM and post op pain limiting functional mobility.  Pt should progress to dc home with assist of friend and HHPT follow up.    Follow Up Recommendations Home health PT    Equipment Recommendations  None recommended by PT    Recommendations for Other Services OT consult     Precautions / Restrictions Precautions Precautions: Fall Restrictions Weight Bearing Restrictions: No Other Position/Activity Restrictions: WBAT      Mobility  Bed Mobility Overal bed mobility: Needs Assistance Bed Mobility: Supine to Sit     Supine to sit: Min assist     General bed mobility comments: cues for sequence and use of L LE to self assist  Transfers Overall transfer level: Needs assistance Equipment used: Rolling walker (2 wheeled) Transfers: Sit to/from Stand Sit to Stand: Min guard         General transfer comment: cues for LE management and use of UEs to self assist  Ambulation/Gait Ambulation/Gait assistance: Min assist Ambulation Distance (Feet): 220 Feet (and 15' to bathroom) Assistive device: Rolling walker (2 wheeled) Gait Pattern/deviations: Step-to pattern;Step-through pattern;Decreased step length - right;Decreased step length - left;Shuffle;Trunk flexed Gait velocity: decr Gait velocity interpretation: Below normal speed for age/gender General Gait Details: cues for posture, position from RW and initial sequence  Stairs            Wheelchair Mobility    Modified Rankin (Stroke Patients Only)       Balance                                             Pertinent Vitals/Pain Pain Assessment: 0-10 Pain Score: 4  Pain Location: R hip Pain Descriptors / Indicators: Aching;Sore Pain  Intervention(s): Limited activity within patient's tolerance;Monitored during session;Premedicated before session;Ice applied    Home Living Family/patient expects to be discharged to:: Private residence Living Arrangements: Alone Available Help at Discharge: Friend(s);Available 24 hours/day Type of Home: House Home Access: Stairs to enter Entrance Stairs-Rails: None Entrance Stairs-Number of Steps: 3 Home Layout: Two level Home Equipment: Walker - standard;Cane - single point      Prior Function Level of Independence: Independent               Hand Dominance        Extremity/Trunk Assessment   Upper Extremity Assessment Upper Extremity Assessment: Overall WFL for tasks assessed    Lower Extremity Assessment Lower Extremity Assessment: RLE deficits/detail RLE Deficits / Details: Strength at hip 2+/5 with AAROM at hip to 80 flex and 10 abd    Cervical / Trunk Assessment Cervical / Trunk Assessment: Normal  Communication   Communication: No difficulties  Cognition Arousal/Alertness: Awake/alert Behavior During Therapy: WFL for tasks assessed/performed Overall Cognitive Status: Within Functional Limits for tasks assessed                      General Comments      Exercises Total Joint Exercises Ankle Circles/Pumps: AROM;Both;Supine;15 reps Quad Sets: AROM;Both;10 reps;Supine Heel Slides: AAROM;20 reps;Supine;Right Hip ABduction/ADduction: AAROM;Right;15 reps;Supine   Assessment/Plan    PT Assessment Patient needs continued PT services  PT Problem List Decreased  strength;Decreased range of motion;Decreased activity tolerance;Decreased mobility;Pain;Decreased knowledge of use of DME       PT Treatment Interventions DME instruction;Gait training;Stair training;Functional mobility training;Therapeutic activities;Therapeutic exercise;Patient/family education    PT Goals (Current goals can be found in the Care Plan section)  Acute Rehab PT  Goals Patient Stated Goal: Regain IND PT Goal Formulation: With patient Time For Goal Achievement: 05/05/16 Potential to Achieve Goals: Good    Frequency 7X/week   Barriers to discharge        Co-evaluation               End of Session Equipment Utilized During Treatment: Gait belt Activity Tolerance: Patient tolerated treatment well Patient left: in chair;with call bell/phone within reach;with family/visitor present Nurse Communication: Mobility status PT Visit Diagnosis: Difficulty in walking, not elsewhere classified (R26.2)         Time: 2863-8177 PT Time Calculation (min) (ACUTE ONLY): 38 min   Charges:   PT Evaluation $PT Eval Low Complexity: 1 Procedure PT Treatments $Gait Training: 8-22 mins $Therapeutic Exercise: 8-22 mins   PT G Codes:         Samadhi Mahurin 05/01/2016, 12:32 PM

## 2016-05-01 NOTE — Discharge Instructions (Addendum)
° °Dr. Frank Aluisio °Total Joint Specialist °[facility] °[address] °[place], [place] [address] °[phone] ° °ANTERIOR APPROACH TOTAL HIP REPLACEMENT POSTOPERATIVE DIRECTIONS ° ° °Hip Rehabilitation, Guidelines Following Surgery  °The results of a hip operation are greatly improved after range of motion and muscle strengthening exercises. Follow all safety measures which are given to protect your hip. If any of these exercises cause increased pain or swelling in your joint, decrease the amount until you are comfortable again. Then slowly increase the exercises. Call your caregiver if you have problems or questions.  ° °HOME CARE INSTRUCTIONS  °Remove items at home which could result in a fall. This includes throw rugs or furniture in walking pathways.  °· ICE to the affected hip every three hours for 30 minutes at a time and then as needed for pain and swelling.  Continue to use ice on the hip for pain and swelling from surgery. You may notice swelling that will progress down to the foot and ankle.  This is normal after surgery.  Elevate the leg when you are not up walking on it.   °· Continue to use the breathing machine which will help keep your temperature down.  It is common for your temperature to cycle up and down following surgery, especially at night when you are not up moving around and exerting yourself.  The breathing machine keeps your lungs expanded and your temperature down. ° ° °DIET °You may resume your previous home diet once your are discharged from the hospital. ° °DRESSING / WOUND CARE / SHOWERING °You may shower 3 days after surgery, but keep the wounds dry during showering.  You may use an occlusive plastic wrap (Press'n Seal for example), NO SOAKING/SUBMERGING IN THE BATHTUB.  If the bandage gets wet, change with a clean dry gauze.  If the incision gets wet, pat the wound dry with a clean towel. °You may start showering once you are discharged home but do not  submerge the incision under water. Just pat the incision dry and apply a dry gauze dressing on daily. °Change the surgical dressing daily and reapply a dry dressing each time. ° °ACTIVITY °Walk with your walker as instructed. °Use walker as long as suggested by your caregivers. °Avoid periods of inactivity such as sitting longer than an hour when not asleep. This helps prevent blood clots.  °You may resume a sexual relationship in one month or when given the OK by your doctor.  °You may return to work once you are cleared by your doctor.  °Do not drive a car for 6 weeks or until released by you surgeon.  °Do not drive while taking narcotics. ° °WEIGHT BEARING °Weight bearing as tolerated with assist device (walker, cane, etc) as directed, use it as long as suggested by your surgeon or therapist, typically at least 4-6 weeks. ° °POSTOPERATIVE CONSTIPATION PROTOCOL °Constipation - defined medically as fewer than three stools per week and severe constipation as less than one stool per week. ° °One of the most common issues patients have following surgery is constipation.  Even if you have a regular bowel pattern at home, your normal regimen is likely to be disrupted due to multiple reasons following surgery.  Combination of anesthesia, postoperative narcotics, change in appetite and fluid intake all can affect your bowels.  In order to avoid complications following surgery, here are some recommendations in order to help you during your recovery period. ° °Colace (docusate) - Pick up an over-the-counter   form of Colace or another stool softener and take twice a day as long as you are requiring postoperative pain medications.  Take with a full glass of water daily.  If you experience loose stools or diarrhea, hold the colace until you stool forms back up.  If your symptoms do not get better within 1 week or if they get worse, check with your doctor. ° °Dulcolax (bisacodyl) - Pick up over-the-counter and take as directed  by the product packaging as needed to assist with the movement of your bowels.  Take with a full glass of water.  Use this product as needed if not relieved by Colace only.  ° °MiraLax (polyethylene glycol) - Pick up over-the-counter to have on hand.  MiraLax is a solution that will increase the amount of water in your bowels to assist with bowel movements.  Take as directed and can mix with a glass of water, juice, soda, coffee, or tea.  Take if you go more than two days without a movement. °Do not use MiraLax more than once per day. Call your doctor if you are still constipated or irregular after using this medication for 7 days in a row. ° °If you continue to have problems with postoperative constipation, please contact the office for further assistance and recommendations.  If you experience "the worst abdominal pain ever" or develop nausea or vomiting, please contact the office immediatly for further recommendations for treatment. ° °ITCHING ° If you experience itching with your medications, try taking only a single pain pill, or even half a pain pill at a time.  You can also use Benadryl over the counter for itching or also to help with sleep.  ° °TED HOSE STOCKINGS °Wear the elastic stockings on both legs for three weeks following surgery during the day but you may remove then at night for sleeping. ° °MEDICATIONS °See your medication summary on the “After Visit Summary” that the nursing staff will review with you prior to discharge.  You may have some home medications which will be placed on hold until you complete the course of blood thinner medication.  It is important for you to complete the blood thinner medication as prescribed by your surgeon.  Continue your approved medications as instructed at time of discharge. ° °PRECAUTIONS °If you experience chest pain or shortness of breath - call 911 immediately for transfer to the hospital emergency department.  °If you develop a fever greater that 101 F,  purulent drainage from wound, increased redness or drainage from wound, foul odor from the wound/dressing, or calf pain - CONTACT YOUR SURGEON.   °                                                °FOLLOW-UP APPOINTMENTS °Make sure you keep all of your appointments after your operation with your surgeon and caregivers. You should call the office at the above phone number and make an appointment for approximately two weeks after the date of your surgery or on the date instructed by your surgeon outlined in the "After Visit Summary". ° °RANGE OF MOTION AND STRENGTHENING EXERCISES  °These exercises are designed to help you keep full movement of your hip joint. Follow your caregiver's or physical therapist's instructions. Perform all exercises about fifteen times, three times per day or as directed. Exercise both hips, even if you   have had only one joint replacement. These exercises can be done on a training (exercise) mat, on the floor, on a table or on a bed. Use whatever works the best and is most comfortable for you. Use music or television while you are exercising so that the exercises are a pleasant break in your day. This will make your life better with the exercises acting as a break in routine you can look forward to.  °Lying on your back, slowly slide your foot toward your buttocks, raising your knee up off the floor. Then slowly slide your foot back down until your leg is straight again.  °Lying on your back spread your legs as far apart as you can without causing discomfort.  °Lying on your side, raise your upper leg and foot straight up from the floor as far as is comfortable. Slowly lower the leg and repeat.  °Lying on your back, tighten up the muscle in the front of your thigh (quadriceps muscles). You can do this by keeping your leg straight and trying to raise your heel off the floor. This helps strengthen the largest muscle supporting your knee.  °Lying on your back, tighten up the muscles of your  buttocks both with the legs straight and with the knee bent at a comfortable angle while keeping your heel on the floor.  ° °IF YOU ARE TRANSFERRED TO A SKILLED REHAB FACILITY °If the patient is transferred to a skilled rehab facility following release from the hospital, a list of the current medications will be sent to the facility for the patient to continue.  When discharged from the skilled rehab facility, please have the facility set up the patient's Home Health Physical Therapy prior to being released. Also, the skilled facility will be responsible for providing the patient with their medications at time of release from the facility to include their pain medication, the muscle relaxants, and their blood thinner medication. If the patient is still at the rehab facility at time of the two week follow up appointment, the skilled rehab facility will also need to assist the patient in arranging follow up appointment in our office and any transportation needs. ° °MAKE SURE YOU:  °Understand these instructions.  °Get help right away if you are not doing well or get worse.  ° ° °Pick up stool softner and laxative for home use following surgery while on pain medications. °Do not submerge incision under water. °Please use good hand washing techniques while changing dressing each day. °May shower starting three days after surgery. °Please use a clean towel to pat the incision dry following showers. °Continue to use ice for pain and swelling after surgery. °Do not use any lotions or creams on the incision until instructed by your surgeon. ° °Take Xarelto for two and a half more weeks following discharge from the hospital, then discontinue Xarelto. °Once the patient has completed the blood thinner regimen, then take a Baby 81 mg Aspirin daily for three more weeks. ° ° ° °Information on my medicine - XARELTO® (Rivaroxaban) ° °This medication education was reviewed with me or my healthcare representative as part of my  discharge preparation.   ° °Why was Xarelto® prescribed for you? °Xarelto® was prescribed for you to reduce the risk of blood clots forming after orthopedic surgery. The medical term for these abnormal blood clots is venous thromboembolism (VTE). ° °What do you need to know about xarelto® ? °Take your Xarelto® ONCE DAILY at the same time every day. °You   may take it either with or without food. ° °If you have difficulty swallowing the tablet whole, you may crush it and mix in applesauce just prior to taking your dose. ° °Take Xarelto® exactly as prescribed by your doctor and DO NOT stop taking Xarelto® without talking to the doctor who prescribed the medication.  Stopping without other VTE prevention medication to take the place of Xarelto® may increase your risk of developing a clot. ° °After discharge, you should have regular check-up appointments with your healthcare provider that is prescribing your Xarelto®.   ° °What do you do if you miss a dose? °If you miss a dose, take it as soon as you remember on the same day then continue your regularly scheduled once daily regimen the next day. Do not take two doses of Xarelto® on the same day.  ° °Important Safety Information °A possible side effect of Xarelto® is bleeding. You should call your healthcare provider right away if you experience any of the following: °? Bleeding from an injury or your nose that does not stop. °? Unusual colored urine (red or dark brown) or unusual colored stools (red or black). °? Unusual bruising for unknown reasons. °? A serious fall or if you hit your head (even if there is no bleeding). ° °Some medicines may interact with Xarelto® and might increase your risk of bleeding while on Xarelto®. To help avoid this, consult your healthcare provider or pharmacist prior to using any new prescription or non-prescription medications, including herbals, vitamins, non-steroidal anti-inflammatory drugs (NSAIDs) and supplements. ° °This website has  more information on Xarelto®: www.xarelto.com. ° ° °

## 2016-05-09 ENCOUNTER — Encounter: Payer: Self-pay | Admitting: *Deleted

## 2016-05-09 ENCOUNTER — Other Ambulatory Visit: Payer: Self-pay | Admitting: *Deleted

## 2016-05-09 ENCOUNTER — Other Ambulatory Visit: Payer: Self-pay | Admitting: Internal Medicine

## 2016-05-09 NOTE — Patient Outreach (Addendum)
[facility]) Care Management  05/09/2016  Corey [name] 25-Nov-1950 [id number]  Subjective: Telephone call to patient's home / mobile number, spoke with patient, and HIPAA verified.  Discussed [facility] Care Management [employer] Transition of care follow up, patient voiced understanding, and is in agreement to complete follow up. Patient states he has his good days, bad days, and today is a bad day for pain.  States he is comfortable currently, recently took pain medication, trying to take less pain medications overall, less than 7 tablets per day, and feels that his pain is efficiently being managed with medications.  States he is watching for signs / symptoms of blood clots, does deep breathing, wearing compression stockings, keeps moving overall, ambulates and moves around hourly.   Patient states he understands the importance of education and patient advocacy, does both well.  Patient states he has a hospital follow up appointment on 05/13/16 with surgeon and will call primary MD's office to schedule hospital follow up appointment.  He is receiving home health physical therapy through [facility] and it is going well, next visit today at 1:00pm.  Medication review completed and discrepancies discussed with patient.  Patient states he is taking as prescribed, Xarelto made him sick, causing a reaction, did not want the side of effects of Fosamax, medications were changed to (Eliquis and Cialis)  prior to discharge and he was given updated prescriptions.  Per chart review discharge summary and AVS not updated.  Patient will also discussed medication changes during hospital follow up visit with primary MD.   States he is very health holistic and does not take medications that have too many side effects.  States he takes his blood pressure twice every day and it is being well managed.   Outpatient Encounter Prescriptions as of 05/09/2016  Medication Sig Note  . acetaminophen (TYLENOL) 650 MG  CR tablet Take 650 mg by mouth every 8 (eight) hours as needed for pain.   Marland Kitchen amLODipine-olmesartan (AZOR) 5-40 MG tablet Take 0.5 tablets by mouth daily.   Marland Kitchen apixaban (ELIQUIS) 2.5 MG TABS tablet Take 2.5 mg by mouth 2 (two) times daily.   . cetirizine (ZYRTEC) 10 MG tablet Take 10 mg by mouth 2 (two) times daily. Takes twice daily   . methocarbamol (ROBAXIN) 500 MG tablet Take 1 tablet (500 mg total) by mouth every 6 (six) hours as needed for muscle spasms.   Marland Kitchen oxyCODONE (OXY IR/ROXICODONE) 5 MG immediate release tablet Take 1-2 tablets (5-10 mg total) by mouth every 4 (four) hours as needed for moderate pain or severe pain.   . pantoprazole (PROTONIX) 40 MG tablet Take 1 tablet (40 mg total) by mouth daily. (Patient taking differently: Take 20 mg by mouth daily. Takes 0.5 tablet) 05/09/2016: Patient states is now taking as directed 40 mg daily.   . tadalafil (CIALIS) 5 MG tablet Take 5 mg by mouth daily as needed for erectile dysfunction.   . rivaroxaban (XARELTO) 10 MG TABS tablet Take 1 tablet (10 mg total) by mouth daily with breakfast. Take Xarelto for two and a half more weeks following discharge from the hospital, then discontinue Xarelto. Once the patient has completed the blood thinner regimen, then take a Baby 81 mg Aspirin daily for three more weeks. (Patient not taking: Reported on 05/09/2016) 05/09/2016: Patient states discontinued at hospital.   . traMADol (ULTRAM) 50 MG tablet Take 1-2 tablets (50-100 mg total) by mouth every 6 (six) hours as needed for moderate pain. (Patient  not taking: Reported on 05/09/2016) 05/09/2016: Patient states he is not taking since it does not take it.    No facility-administered encounter medications on file as of 05/09/2016.    Patient states he does not have any transition of care, care coordination, disease management, disease monitoring, transportation, community resource, or pharmacy needs at this time.  States he very appreciative of the follow up call,  and is in agreement to receive [place] Management information.   Objective: Per [employer], [facility] point of care tool, and chart review: patient hospitalized 04/30/16 - 05/01/16 for Osteoarthritis of the Right hip.  Patient also had ED visit on 12/19/15 for right hip and leg pain.  Has a history of hyperlipidemia and hypertension.   Assessment:  Received [employer] Transition of care referral on 05/07/16.   Transition of care referral completed, no care management needs, and will proceed with case closure.   Plan: RNCM will send patient successful outreach letter, [facility] pamphlet, and magnet. RNCM will send case closure due to follow up completed / no care management needs request to [facility]. RNCM will update case status in [facility].   Arjen Deringer H. Annia Friendly, BSN, [facility] Telephonic CM Phone: [phone] Fax: [phone]

## 2016-05-29 ENCOUNTER — Other Ambulatory Visit: Payer: Self-pay | Admitting: Internal Medicine

## 2016-08-27 ENCOUNTER — Other Ambulatory Visit: Payer: Self-pay | Admitting: Internal Medicine

## 2016-11-25 ENCOUNTER — Other Ambulatory Visit: Payer: Self-pay | Admitting: Internal Medicine

## 2017-03-06 ENCOUNTER — Telehealth: Payer: Self-pay | Admitting: Internal Medicine

## 2017-03-06 MED ORDER — TADALAFIL 5 MG PO TABS: ORAL_TABLET | ORAL | 1 refills | Status: DC

## 2017-03-06 NOTE — Telephone Encounter (Signed)
Reviewed chart pt is up-to-date sent [facility]ills [facility].Marland KitchenJohny Chess

## 2017-03-06 NOTE — Telephone Encounter (Signed)
Copied from [place] [id number]. Topic: Quick Communication - Rx Refill/Question >> Mar 06, 2017 12:55 PM Malena Catholic I, NT wrote: Medication: Cialis 5 Mg 90 Day supply    Has the patient contacted their pharmacy yes   (Agent: If no, request that the patient contact the pharmacy for the refill ye Preferred Pharmacy (with phone number or street name [facility] [phone]   Agent: Please be advised that RX refills may take up to 3 business days. We ask that you follow-up with your pharmacy.

## 2017-06-02 ENCOUNTER — Encounter: Payer: Self-pay | Admitting: Internal Medicine

## 2017-06-02 ENCOUNTER — Ambulatory Visit (INDEPENDENT_AMBULATORY_CARE_PROVIDER_SITE_OTHER): Payer: Managed Care, Other (non HMO) | Admitting: Internal Medicine

## 2017-06-02 VITALS — BP 134/72 | HR 99 | Temp 98.2°F | Ht 73.0 in | Wt 230.0 lb

## 2017-06-02 DIAGNOSIS — J4520 Mild intermittent asthma, uncomplicated: Secondary | ICD-10-CM

## 2017-06-02 DIAGNOSIS — L508 Other urticaria: Secondary | ICD-10-CM | POA: Diagnosis not present

## 2017-06-02 DIAGNOSIS — I10 Essential (primary) hypertension: Secondary | ICD-10-CM

## 2017-06-02 DIAGNOSIS — K2270 Barrett's esophagus without dysplasia: Secondary | ICD-10-CM

## 2017-06-02 DIAGNOSIS — Z0000 Encounter for general adult medical examination without abnormal findings: Secondary | ICD-10-CM | POA: Diagnosis not present

## 2017-06-02 MED ORDER — TADALAFIL 5 MG PO TABS: ORAL_TABLET | ORAL | 3 refills | Status: DC

## 2017-06-02 MED ORDER — PANTOPRAZOLE SODIUM 40 MG PO TBEC: 40.0000 mg | DELAYED_RELEASE_TABLET | Freq: Every day | ORAL | 3 refills | Status: DC

## 2017-06-02 MED ORDER — AMLODIPINE-OLMESARTAN 5-40 MG PO TABS: ORAL_TABLET | ORAL | 3 refills | Status: DC

## 2017-06-02 NOTE — Assessment & Plan Note (Signed)
Zyrtec  

## 2017-06-02 NOTE — Assessment & Plan Note (Signed)
Protonix 40 mg/d Dr Fuller Plan

## 2017-06-02 NOTE — Assessment & Plan Note (Signed)
We discussed age appropriate health related issues, including available/recomended screening tests and vaccinations. We discussed a need for adhering to healthy diet and exercise. Labs were ordered to be later reviewed . All questions were answered.  Colon due in 2020 w/Dr Fuller Plan

## 2017-06-02 NOTE — Progress Notes (Signed)
Subjective:  Patient ID: Corey [name], male    DOB: February 11, 1951  Age: 67 y.o. MRN: [id number]  CC: No chief complaint on file.   HPI Corey [name] presents for a well exam F/u OA, HTN, GERD, allergies f/u  Outpatient Medications Prior to Visit  Medication Sig Dispense Refill  . acetaminophen (TYLENOL) 650 MG CR tablet Take 650 mg by mouth every 8 (eight) hours as needed for pain.    Marland Kitchen amLODipine-olmesartan (AZOR) 5-40 MG tablet TAKE ONE-HALF (1/2) TABLET BY MOUTH DAILY. YEARLY     PHYSICAL WITH LABS DUE IN  FEBRUARY FOR REFILLS 45 tablet 3  . cetirizine (ZYRTEC) 10 MG tablet Take 10 mg by mouth 2 (two) times daily. Takes twice daily    . pantoprazole (PROTONIX) 40 MG tablet Take 1 tablet (40 mg total) by mouth daily. (Patient taking differently: Take 20 mg by mouth daily. Takes 0.5 tablet) 90 tablet 3  . tadalafil (CIALIS) 5 MG tablet TAKE 1 TABLET BY MOUTH IN  THE MORNING -DO NOT TAKE IFYOU ARE CURRENTLY TAKING   NITRATES. 90 tablet 1  . amLODipine-olmesartan (AZOR) 5-40 MG tablet Take 0.5 tablets by mouth daily. 45 tablet 3  . alendronate (FOSAMAX) 70 MG tablet TAKE 1 TABLET EVERY 7 DAYS.TAKE WITH A FULL GLASS OF  WATER ON AN EMPTY STOMACH 12 tablet 2  . apixaban (ELIQUIS) 2.5 MG TABS tablet Take 2.5 mg by mouth 2 (two) times daily.    . methocarbamol (ROBAXIN) 500 MG tablet Take 1 tablet (500 mg total) by mouth every 6 (six) hours as needed for muscle spasms. 80 tablet 0  . oxyCODONE (OXY IR/ROXICODONE) 5 MG immediate release tablet Take 1-2 tablets (5-10 mg total) by mouth every 4 (four) hours as needed for moderate pain or severe pain. 84 tablet 0  . pantoprazole (PROTONIX) 40 MG tablet TAKE 1 TABLET BY MOUTH     DAILY 90 tablet 2  . rivaroxaban (XARELTO) 10 MG TABS tablet Take 1 tablet (10 mg total) by mouth daily with breakfast. Take Xarelto for two and a half more weeks following discharge from the hospital, then discontinue Xarelto. Once the patient has completed the blood  thinner regimen, then take a Baby 81 mg Aspirin daily for three more weeks. (Patient not taking: Reported on 05/09/2016) 20 tablet 0  . traMADol (ULTRAM) 50 MG tablet Take 1-2 tablets (50-100 mg total) by mouth every 6 (six) hours as needed for moderate pain. (Patient not taking: Reported on 05/09/2016) 56 tablet 0   No facility-administered medications prior to visit.     ROS Review of Systems  Constitutional: Negative for appetite change, fatigue and unexpected weight change.  HENT: Negative for congestion, nosebleeds, sneezing, sore throat and trouble swallowing.   Eyes: Negative for itching and visual disturbance.  Respiratory: Negative for cough.   Cardiovascular: Negative for chest pain, palpitations and leg swelling.  Gastrointestinal: Negative for abdominal distention, blood in stool, diarrhea and nausea.  Genitourinary: Negative for frequency and hematuria.  Musculoskeletal: Negative for back pain, gait problem, joint swelling and neck pain.  Skin: Negative for rash.  Neurological: Negative for dizziness, tremors, speech difficulty and weakness.  Psychiatric/Behavioral: Negative for agitation, dysphoric mood and sleep disturbance. The patient is not nervous/anxious.     Objective:  BP 134/72 (BP Location: Left Arm, Patient Position: Sitting, Cuff Size: Large)   Pulse 99   Temp 98.2 F (36.8 C) (Oral)   Ht 6\' 1"  (1.854 m)   Wt 230 lb (  104.3 kg)   SpO2 98%   BMI 30.34 kg/m   BP Readings from Last 3 Encounters:  06/02/17 134/72  05/09/16 130/73  05/01/16 125/75    Wt Readings from Last 3 Encounters:  06/02/17 230 lb (104.3 kg)  04/30/16 219 lb (99.3 kg)  04/25/16 219 lb (99.3 kg)    Physical Exam  Constitutional: He is oriented to person, place, and time. He appears well-developed. No distress.  NAD  HENT:  Mouth/Throat: Oropharynx is clear and moist.  Eyes: Pupils are equal, round, and reactive to light. Conjunctivae are normal.  Neck: Normal range of motion. No  JVD present. No thyromegaly present.  Cardiovascular: Normal rate, regular rhythm, normal heart sounds and intact distal pulses. Exam reveals no gallop and no friction rub.  No murmur heard. Pulmonary/Chest: Effort normal and breath sounds normal. No respiratory distress. He has no wheezes. He has no rales. He exhibits no tenderness.  Abdominal: Soft. Bowel sounds are normal. He exhibits no distension and no mass. There is no tenderness. There is no rebound and no guarding.  Genitourinary: Rectum normal and prostate normal. Rectal exam shows guaiac negative stool.  Musculoskeletal: Normal range of motion. He exhibits no edema or tenderness.  Lymphadenopathy:    He has no cervical adenopathy.  Neurological: He is alert and oriented to person, place, and time. He has normal reflexes. No cranial nerve deficit. He exhibits normal muscle tone. He displays a negative Romberg sign. Coordination and gait normal.  Skin: Skin is warm and dry. No rash noted.  Psychiatric: He has a normal mood and affect. His behavior is normal. Judgment and thought content normal.    Lab Results  Component Value Date   WBC 15.1 (H) 05/01/2016   HGB 12.1 (L) 05/01/2016   HCT 35.8 (L) 05/01/2016   PLT 199 05/01/2016   GLUCOSE 140 (H) 05/01/2016   CHOL 238 (H) 03/28/2016   TRIG 194.0 (H) 03/28/2016   HDL 46.10 03/28/2016   LDLDIRECT 191.5 04/15/2013   LDLDIRECT 193.6 04/15/2013   LDLCALC 153 (H) 03/28/2016   ALT 62 04/25/2016   AST 47 (H) 04/25/2016   NA 136 05/01/2016   K 4.2 05/01/2016   CL 102 05/01/2016   CREATININE 1.04 05/01/2016   BUN 15 05/01/2016   CO2 25 05/01/2016   TSH 1.13 03/28/2016   PSA 0.98 03/28/2016   INR 0.95 04/25/2016   HGBA1C 5.9 05/16/2009    No results found.  Assessment & Plan:   There are no diagnoses linked to this encounter. I have discontinued Corey [name]. Corey [name]'s methocarbamol, oxyCODONE, rivaroxaban, traMADol, apixaban, and alendronate. I am also having him maintain his  cetirizine, pantoprazole, acetaminophen, amLODipine-olmesartan, and tadalafil.  No orders of the defined types were placed in this encounter.    Follow-up: No follow-ups on file.  Walker Kehr, MD

## 2017-06-02 NOTE — Assessment & Plan Note (Signed)
Azor 1/2 tab/d 

## 2017-06-02 NOTE — Assessment & Plan Note (Signed)
Off Rx 

## 2017-08-31 ENCOUNTER — Telehealth: Payer: Self-pay | Admitting: Internal Medicine

## 2017-08-31 NOTE — Telephone Encounter (Signed)
Copied from [place]. Topic: Quick Communication - Rx Refill/Question >> Aug 31, 2017 11:11 AM Bea Graff, NT wrote: Medication: tadalafil (CIALIS) 5 MG tablet pt is requesting 30 day supply  Has the patient contacted their pharmacy? Yes.   (Agent: If no, request that the patient contact the pharmacy for the refill.) (Agent: If yes, when and what did the pharmacy advise?)  Preferred Pharmacy (with phone number or street name): [id number] [place], [place] [address] [phone] (Phone) [phone] (Fax)      Agent: Please be advised that RX refills may take up to 3 business days. We ask that you follow-up with your pharmacy.

## 2017-09-01 ENCOUNTER — Other Ambulatory Visit: Payer: Self-pay | Admitting: *Deleted

## 2017-09-01 MED ORDER — TADALAFIL 5 MG PO TABS: ORAL_TABLET | ORAL | 9 refills | Status: DC

## 2017-09-01 NOTE — Telephone Encounter (Signed)
Patient is calling to state he has had a insurance change and no longer can use mail order pharmacy. His Rx has been canceled and he needs a new Rx sent to local pharmacy. Remainder of Rx sent to local pharmacy per patient request.

## 2017-11-25 IMAGING — MR MR LUMBAR SPINE W/O CM
4 of 5 series · 19 of 48 positions shown · non-contrast
Comparison: 12/05/2015

CLINICAL DATA: Low back pain right leg pain for 1 month. Injury
lifting a heavy stand she.

EXAM:
MRI LUMBAR SPINE WITHOUT CONTRAST
TECHNIQUE: Multiplanar, multisequence MR imaging of the lumbar spine was
performed. No intravenous contrast was administered.

[Series 4: T1 · sagittal · 4.0mm · 0.51mm/px · 3 of 14 slices shown (1 of 2)]
[im 1/14]
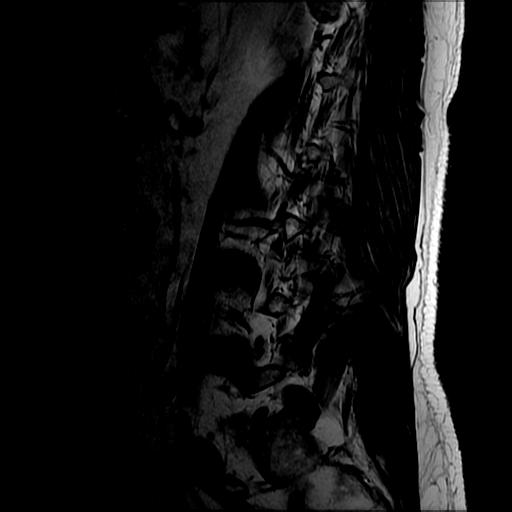
[im 7/14]
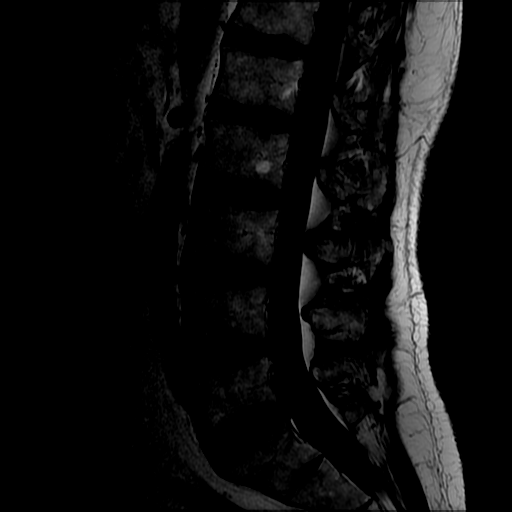
[im 14/14]
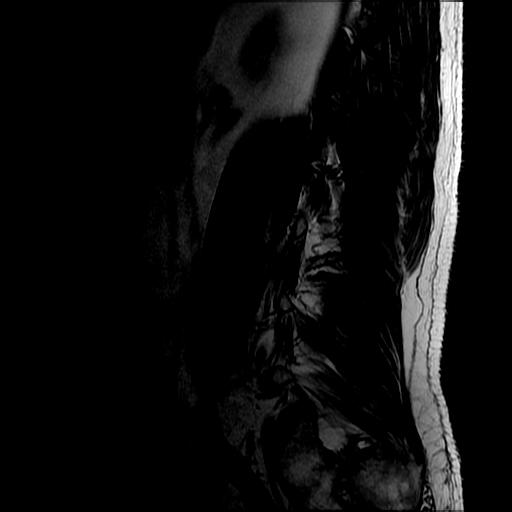

[Series 5: T2 · sagittal · 4.0mm · 0.51mm/px · 5 of 14 slices shown (1 of 2)]
[im 1/14]
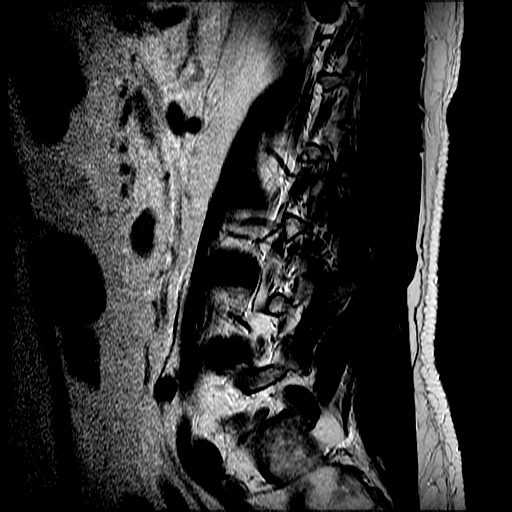
[im 4/14]
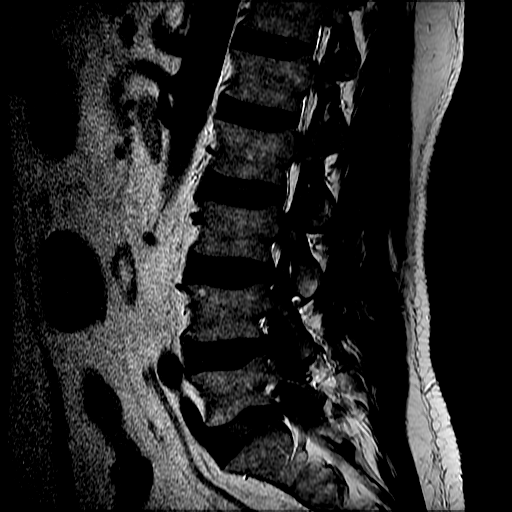
[im 7/14]
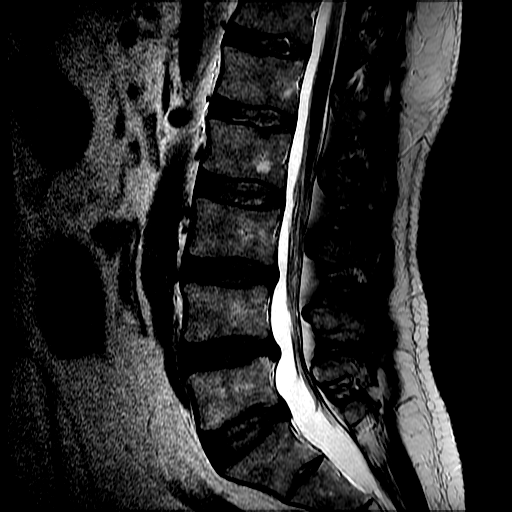
[im 10/14]
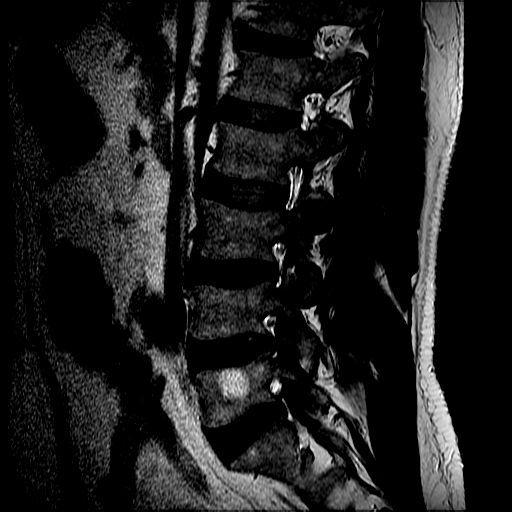
[im 14/14]
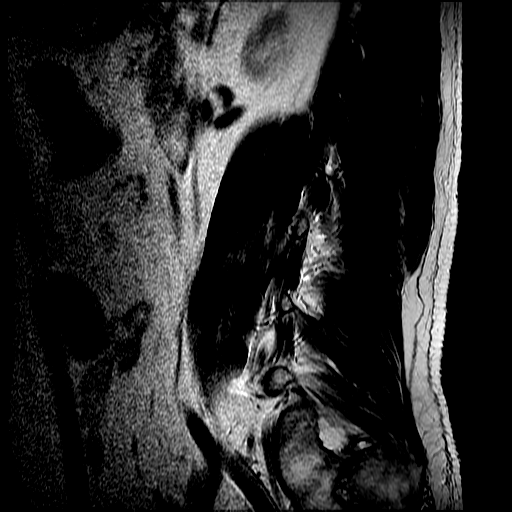

[Series 7: T2 · axial · 4.0mm · 0.39mm/px · z∈[+47,+229]mm · 8 of 40 slices shown (2 of 2)]
[im 3/40]
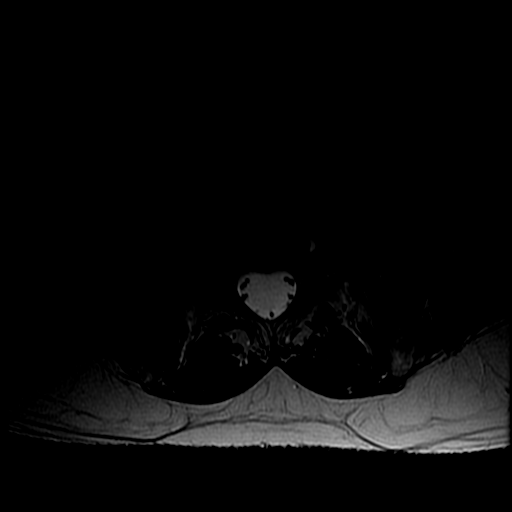
[im 6/40]
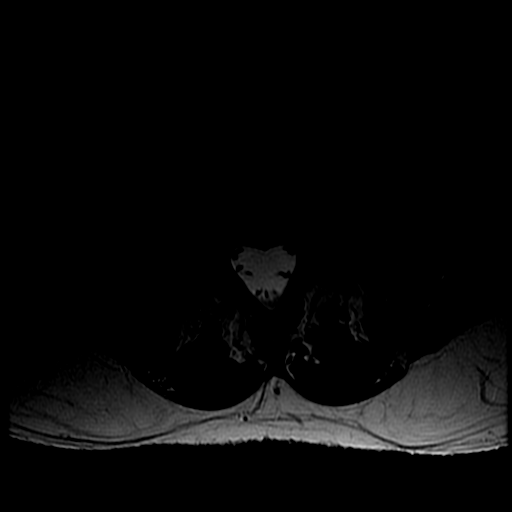
[im 8/40]
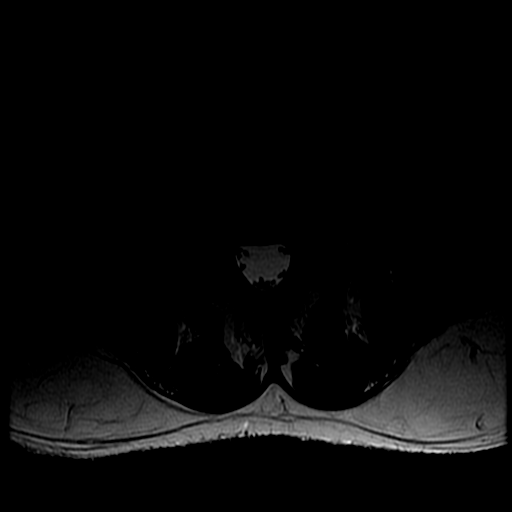
[im 14/40]
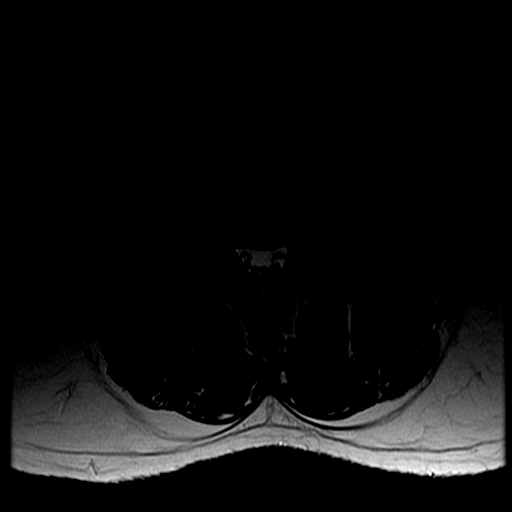
[im 19/40]
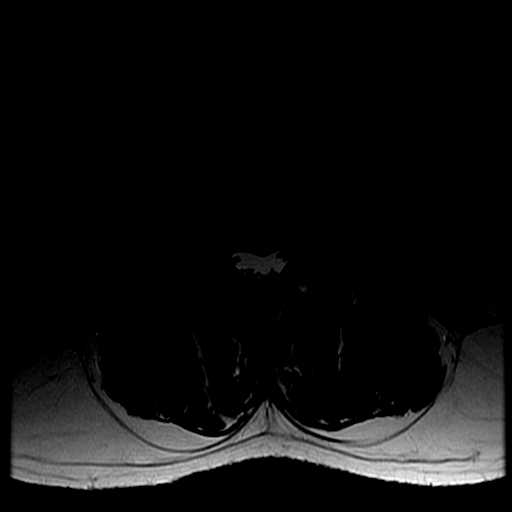
[im 21/40]
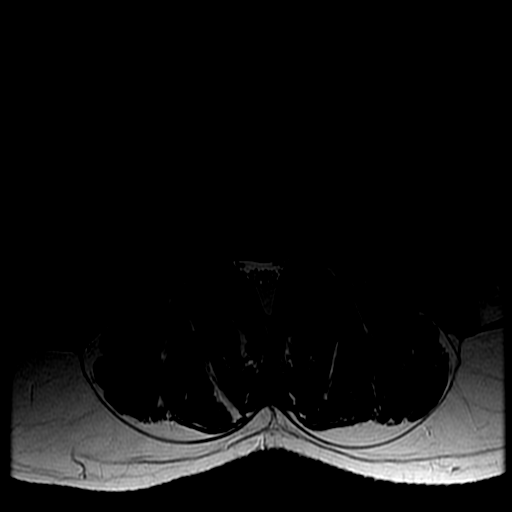
[im 24/40]
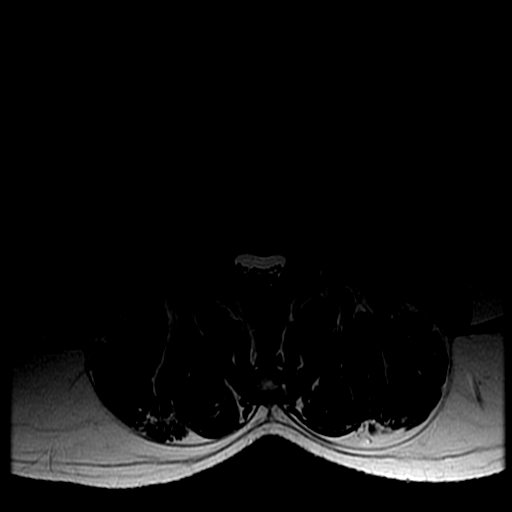
[im 34/40]
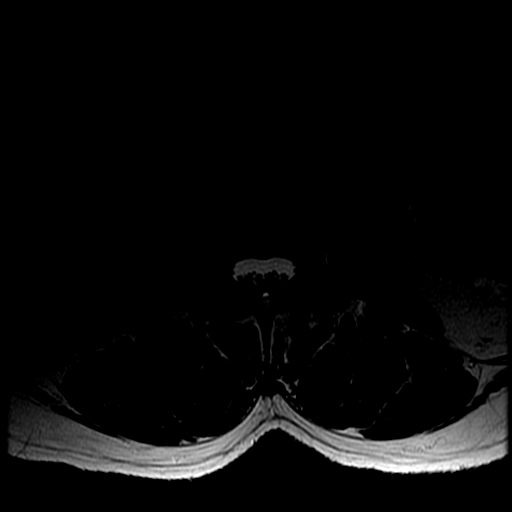

[Series 8: T1 · axial · 4.0mm · 0.39mm/px · z∈[+61,+229]mm · 3 of 40 slices shown (2 of 2)]
[im 6/40]
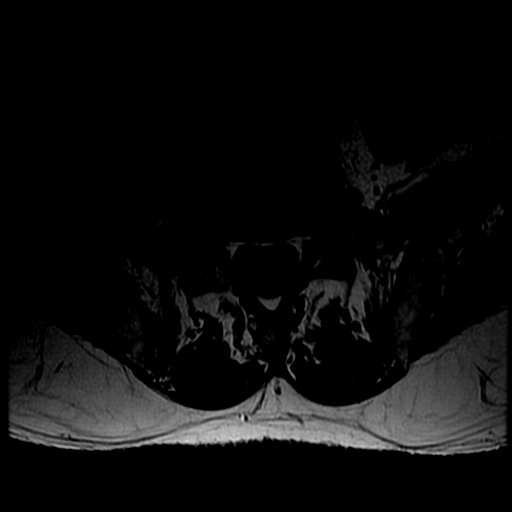
[im 21/40]
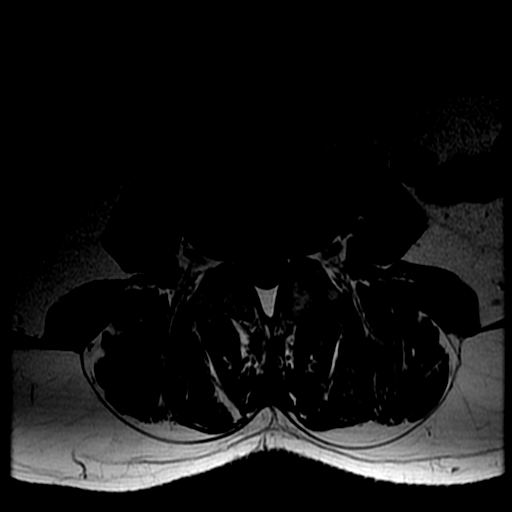
[im 34/40]
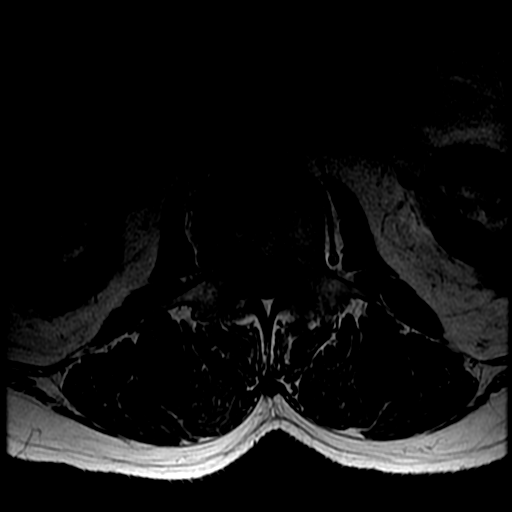

[19 of 48 positions shown; findings below may reference images not displayed]

FINDINGS: Segmentation: The lowest lumbar type non-rib-bearing vertebra is
labeled as L5.

Alignment:  No vertebral subluxation is observed.

Vertebrae: 1.8 cm hemangioma in the L5 vertebral body. 0.7 cm
hemangioma in the L2 vertebral body. No significant vertebral marrow
edema is identified.

Conus medullaris: Extends to the lower L2 level and appears normal
aside from being while in position. No tethering mass is identified.

Paraspinal and other soft tissues:

A fluid signal intensity lesion of the left kidney is partially
characterized on today's exam. This is statistically likely to be a
cyst but not technically specific.

Disc levels:

L1-2: Unremarkable.

L2-3:  No impingement.  Mild disc bulge.

L3-4:  Mild bilateral foraminal stenosis due to diffuse disc bulge.

L4-5: Mild bilateral foraminal stenosis and mild left subarticular
lateral recess stenosis due to left paracentral disc protrusion,
disc bulge, and facet arthropathy.

L5-S1: Mild to moderate bilateral foraminal stenosis due to disc
bulge and facet arthropathy.
IMPRESSION: 1. Lumbar spondylosis and degenerative disc disease, causing mild to
moderate impingement at L5-S1 and mild impingement at L3-4 and L4-5,
as detailed above.
2. Low-lying conus at the lower L2 level, without a tethering mass
identified.

## 2017-12-30 ENCOUNTER — Other Ambulatory Visit: Payer: Self-pay | Admitting: Internal Medicine

## 2017-12-30 MED ORDER — AMLODIPINE-OLMESARTAN 5-40 MG PO TABS: ORAL_TABLET | ORAL | 1 refills | Status: DC

## 2017-12-30 NOTE — Telephone Encounter (Signed)
Copied from [place] [id number]. Topic: Quick Communication - Rx Refill/Question >> Dec 30, 2017  1:32 PM Reyne Dumas L wrote: Medication: amLODipine-olmesartan (AZOR) 5-40 MG tablet  Has the patient contacted their pharmacy? No - changing pharmacy (Agent: If no, request that the patient contact the pharmacy for the refill.) (Agent: If yes, when and what did the pharmacy advise?)  Preferred Pharmacy (with phone number or street name): [id number] [place], [place] [address] [phone] (Phone) [phone] (Fax)  Agent: Please be advised that RX refills may take up to 3 business days. We ask that you follow-up with your pharmacy.

## 2018-05-03 DIAGNOSIS — N201 Calculus of ureter: Secondary | ICD-10-CM | POA: Insufficient documentation

## 2018-05-04 DIAGNOSIS — E1100 Type 2 diabetes mellitus with hyperosmolarity without nonketotic hyperglycemic-hyperosmolar coma (NKHHC): Secondary | ICD-10-CM | POA: Insufficient documentation

## 2018-05-04 DIAGNOSIS — E871 Hypo-osmolality and hyponatremia: Secondary | ICD-10-CM | POA: Insufficient documentation

## 2018-05-06 ENCOUNTER — Telehealth: Payer: Self-pay | Admitting: Internal Medicine

## 2018-05-06 NOTE — Telephone Encounter (Signed)
Copied from [place] [id number]. Topic: General - Other >> May 06, 2018 11:19 AM Reyne Dumas L wrote: Reason for CRM:   Pt wants PCP to know that he is currently in the hospital with a kidney stone and has been dx with diabetes.  Pt states he is at [facility] in [facility], discharge date so far will be tomorrow morning.  States he arrived there with blood sugar level of 750.  Pt wants this to be noted with PCP.  Pt can be reached at [phone]

## 2018-05-07 MED ORDER — INSULIN GLARGINE 100 UNIT/ML ~~LOC~~ SOLN
15.00 | SUBCUTANEOUS | Status: DC
Start: 2018-05-08 — End: 2018-05-07

## 2018-05-07 MED ORDER — HYDROMORPHONE HCL 1 MG/ML IJ SOLN
0.50 | INTRAMUSCULAR | Status: DC
Start: ? — End: 2018-05-07

## 2018-05-07 MED ORDER — MORPHINE SULFATE 2 MG/ML IJ SOLN
2.00 | INTRAMUSCULAR | Status: DC
Start: ? — End: 2018-05-07

## 2018-05-07 MED ORDER — GENERIC EXTERNAL MEDICATION
1.00 | Status: DC
Start: ? — End: 2018-05-07

## 2018-05-07 MED ORDER — INSULIN LISPRO 100 UNIT/ML ~~LOC~~ SOLN
5.00 | SUBCUTANEOUS | Status: DC
Start: 2018-05-07 — End: 2018-05-07

## 2018-05-07 MED ORDER — INSULIN LISPRO 100 UNIT/ML ~~LOC~~ SOLN
2.00 | SUBCUTANEOUS | Status: DC
Start: 2018-05-07 — End: 2018-05-07

## 2018-05-07 MED ORDER — HYDROCODONE-ACETAMINOPHEN 5-325 MG PO TABS
1.00 | ORAL_TABLET | ORAL | Status: DC
Start: ? — End: 2018-05-07

## 2018-05-07 MED ORDER — ACETAMINOPHEN 325 MG PO TABS
650.00 | ORAL_TABLET | ORAL | Status: DC
Start: ? — End: 2018-05-07

## 2018-05-07 MED ORDER — SCOPOLAMINE 1 MG/3DAYS TD PT72
1.00 | MEDICATED_PATCH | TRANSDERMAL | Status: DC
Start: ? — End: 2018-05-07

## 2018-05-07 MED ORDER — ONDANSETRON HCL 4 MG/2ML IJ SOLN
4.00 | INTRAMUSCULAR | Status: DC
Start: ? — End: 2018-05-07

## 2018-05-07 MED ORDER — DEXTROSE 10 % IV SOLN
125.00 | INTRAVENOUS | Status: DC
Start: ? — End: 2018-05-07

## 2018-05-07 MED ORDER — AMLODIPINE BESYLATE 5 MG PO TABS
10.00 | ORAL_TABLET | ORAL | Status: DC
Start: 2018-05-08 — End: 2018-05-07

## 2018-05-07 MED ORDER — METOCLOPRAMIDE HCL 5 MG/ML IJ SOLN
10.00 | INTRAMUSCULAR | Status: DC
Start: ? — End: 2018-05-07

## 2018-05-07 MED ORDER — CETIRIZINE HCL 10 MG PO TABS
10.00 | ORAL_TABLET | ORAL | Status: DC
Start: 2018-05-08 — End: 2018-05-07

## 2018-05-07 MED ORDER — FENTANYL CITRATE (PF) 50 MCG/ML IJ SOLN
25.00 | INTRAMUSCULAR | Status: DC
Start: ? — End: 2018-05-07

## 2018-05-07 MED ORDER — PANTOPRAZOLE SODIUM 40 MG PO TBEC
40.00 | DELAYED_RELEASE_TABLET | ORAL | Status: DC
Start: 2018-05-08 — End: 2018-05-07

## 2018-05-07 MED ORDER — CEFDINIR 300 MG PO CAPS
300.00 | ORAL_CAPSULE | ORAL | Status: DC
Start: 2018-05-07 — End: 2018-05-07

## 2018-05-07 MED ORDER — GLUCOSE 40 % PO GEL
15.00 | ORAL | Status: DC
Start: ? — End: 2018-05-07

## 2018-05-07 MED ORDER — GLUCAGON HCL RDNA (DIAGNOSTIC) 1 MG IJ SOLR
1.00 | INTRAMUSCULAR | Status: DC
Start: ? — End: 2018-05-07

## 2018-05-07 MED ORDER — TAMSULOSIN HCL 0.4 MG PO CAPS
0.40 | ORAL_CAPSULE | ORAL | Status: DC
Start: 2018-05-08 — End: 2018-05-07

## 2018-05-07 NOTE — Telephone Encounter (Signed)
FYI

## 2018-05-08 NOTE — Telephone Encounter (Signed)
Noted! Thank you

## 2018-05-17 ENCOUNTER — Encounter: Payer: Self-pay | Admitting: Internal Medicine

## 2018-05-17 ENCOUNTER — Other Ambulatory Visit (INDEPENDENT_AMBULATORY_CARE_PROVIDER_SITE_OTHER): Payer: Managed Care, Other (non HMO)

## 2018-05-17 ENCOUNTER — Other Ambulatory Visit: Payer: Self-pay

## 2018-05-17 ENCOUNTER — Ambulatory Visit: Payer: Managed Care, Other (non HMO) | Admitting: Internal Medicine

## 2018-05-17 DIAGNOSIS — Z87442 Personal history of urinary calculi: Secondary | ICD-10-CM | POA: Diagnosis not present

## 2018-05-17 DIAGNOSIS — E1065 Type 1 diabetes mellitus with hyperglycemia: Secondary | ICD-10-CM

## 2018-05-17 DIAGNOSIS — E1165 Type 2 diabetes mellitus with hyperglycemia: Secondary | ICD-10-CM | POA: Insufficient documentation

## 2018-05-17 DIAGNOSIS — I10 Essential (primary) hypertension: Secondary | ICD-10-CM

## 2018-05-17 DIAGNOSIS — N179 Acute kidney failure, unspecified: Secondary | ICD-10-CM

## 2018-05-17 DIAGNOSIS — IMO0002 Reserved for concepts with insufficient information to code with codable children: Secondary | ICD-10-CM | POA: Insufficient documentation

## 2018-05-17 DIAGNOSIS — R5383 Other fatigue: Secondary | ICD-10-CM

## 2018-05-17 LAB — CBC WITH DIFFERENTIAL/PLATELET
Basophils Absolute: 0 10*3/uL (ref 0.0–0.1)
Basophils Relative: 0.6 % (ref 0.0–3.0)
EOS PCT: 1.1 % (ref 0.0–5.0)
Eosinophils Absolute: 0.1 10*3/uL (ref 0.0–0.7)
HCT: 40.9 % (ref 39.0–52.0)
Hemoglobin: 14.1 g/dL (ref 13.0–17.0)
Lymphocytes Relative: 20.3 % (ref 12.0–46.0)
Lymphs Abs: 1.3 10*3/uL (ref 0.7–4.0)
MCHC: 34.6 g/dL (ref 30.0–36.0)
MCV: 85.8 fl (ref 78.0–100.0)
Monocytes Absolute: 0.4 10*3/uL (ref 0.1–1.0)
Monocytes Relative: 6.9 % (ref 3.0–12.0)
Neutro Abs: 4.5 10*3/uL (ref 1.4–7.7)
Neutrophils Relative %: 71.1 % (ref 43.0–77.0)
Platelets: 254 10*3/uL (ref 150.0–400.0)
RBC: 4.76 Mil/uL (ref 4.22–5.81)
RDW: 14.9 % (ref 11.5–15.5)
WBC: 6.3 10*3/uL (ref 4.0–10.5)

## 2018-05-17 LAB — BASIC METABOLIC PANEL
BUN: 26 mg/dL — ABNORMAL HIGH (ref 6–23)
CO2: 25 mEq/L (ref 19–32)
Calcium: 9.6 mg/dL (ref 8.4–10.5)
Chloride: 100 mEq/L (ref 96–112)
Creatinine, Ser: 1.22 mg/dL (ref 0.40–1.50)
GFR: 59.55 mL/min — ABNORMAL LOW (ref >60.00)
Glucose, Bld: 144 mg/dL — ABNORMAL HIGH (ref 70–99)
Potassium: 4.2 mEq/L (ref 3.5–5.1)
Sodium: 135 mEq/L (ref 135–145)

## 2018-05-17 MED ORDER — BD PEN NEEDLE SHORT U/F 31G X 8 MM MISC: 1.0000 [IU] | Freq: Four times a day (QID) | 3 refills | Status: DC

## 2018-05-17 MED ORDER — PANTOPRAZOLE SODIUM 40 MG PO TBEC: 40.0000 mg | DELAYED_RELEASE_TABLET | Freq: Every day | ORAL | 3 refills | Status: DC

## 2018-05-17 MED ORDER — AMLODIPINE-OLMESARTAN 5-40 MG PO TABS: ORAL_TABLET | ORAL | 3 refills | Status: DC

## 2018-05-17 MED ORDER — ONETOUCH VERIO VI STRP: ORAL_STRIP | 3 refills | Status: DC

## 2018-05-17 MED ORDER — TADALAFIL 5 MG PO TABS: ORAL_TABLET | ORAL | 3 refills | Status: DC

## 2018-05-17 MED ORDER — LEVEMIR FLEXTOUCH 100 UNIT/ML ~~LOC~~ SOPN: PEN_INJECTOR | SUBCUTANEOUS | 3 refills | Status: DC

## 2018-05-17 MED ORDER — INSULIN LISPRO (0.5 UNIT DIAL) 100 UNIT/ML (KWIKPEN JR): PEN_INJECTOR | SUBCUTANEOUS | 3 refills | Status: DC

## 2018-05-17 MED ORDER — ONETOUCH DELICA PLUS LANCET30G MISC: 1.0000 [IU] | Freq: Four times a day (QID) | 3 refills | Status: DC

## 2018-05-17 NOTE — Assessment & Plan Note (Signed)
New onset Triggered by high doses of steroids (80 mg/d) for rash on feet . Diabetic hyperosmolar non-ketotic state ([place]) . (Resolved) ARF (acute renal failure) (HCC) Humalog, Levimir

## 2018-05-17 NOTE — Assessment & Plan Note (Signed)
Better  

## 2018-05-17 NOTE — Progress Notes (Signed)
Subjective:  Patient ID: Corey [name], male    DOB: Jul 08, 1950  Age: 68 y.o. MRN: [id number]  CC: No chief complaint on file.   HPI Corey [name] presents for post-hosp (HP) f/u for severe hyperglycemia of 700 and a urinary stone #51/stent L side. New onset DM. Triggered by high doses of steroids (80 mg/d) for rash on feet  Per hx:   "Hospitalist Discharge Summary   Name: Corey [name] Age: 68 yrs  MRN: [id number] DOB: 1951-01-02  Admit Date: 05/03/2018 Admitting Physician: Cinda Quest, MD  Discharge Date and Time: 05/07/2018 4:22 PM Discharge Physician: Rochel Brome, MD   Discharge Diagnoses:   1. Hyperkalemia resolved 2. Hyponatremia resolved 3. Essential hypertension 4. Barrett's esophagus 5. Left ureteric stone status post cystoscopy bilateral ureteral pyelogram left ureteroscopy holmium laser stone manipulation 6. Acute renal failure resolved 7. Diabetic hyperosmolar nonketotic state  Present on Admission:  Diabetic hyperosmolar non-ketotic state ([place])  (Resolved) ARF (acute renal failure) ([place])  LEFT Ureteric stone  Barrett's esophagus  Essential hypertension  (Resolved) Metabolic acidosis  Hyponatremia  (Resolved) Hematuria  Diabetes mellitus, new onset ([place])  (Resolved) Hyperkalemia   *For documentation of patient's Past Medical History and Problem List, see the last page. TO DO List at Follow-up for PCP/Specialist:   - Follow up with PCP in 1 week after discharge. Blood sugar log to adjust insulin therapy - Follow-up with urology as outpatient - *  Indication for Hospitalization:   Corey [name] is an 68 y.o. male who presented to [facility] on [facility]03/2018 for for evaluation of elevated blood sugars  Hospital Course:   For the details of admission, please see the H&P by Dr. Cinda Quest, MD on 05/03/2018. For full details, please see progress notes,  consult notes and ancillary notes. The patient's hospital course will be summarized below.   Principal Problem: Diabetic hyperosmolar non-ketotic state ([place]) Active Problems: LEFT Ureteric stone Barrett's esophagus Essential hypertension Hyponatremia Diabetes mellitus, new onset ([place]) Ureteral calculus  No notes on file  Hyponatremia RESOLVED   Essential hypertension Continue with amlodipine We will hold ACE inhibitors/and ARB at this time due to hyperkalemia  Barrett's esophagus Continue with PPI  LEFT Ureteric stone Patient underwent cystoscopy bilateral ureteral great pyelogram left ureteroscopy holmium laser stone manipulation. Left double-J stent fluoroscopy. Patient tolerated the procedure well on discharge given prescription for cefdinir 300 p.o. twice daily for 7 days also will follow-up with urology as outpatient  * Diabetic hyperosmolar non-ketotic state ([place]) Continue with Lantus 15 units daily also 5 units of Humalog before meals and sliding scale Hemoglobin A1c of 10.1 Patient would need close follow-up with PCP with blood sugar log on discharge. Patient has been provided with diabetic education  On discharge patient given prescription for Levemir 10 units daily Sliding scale Humalog insulin Advised follow-up with PCP next week for with blood sugar log to adjust medications  Hyperkalemia-resolved as of 05/07/2018 Potassium level of 5.3 Kayexalate x1 dose EKG was obtained no hyperacute T changes We will check potassium level with labs this afternoon Medications were reviewed  ARF (acute renal failure) (HCC)-resolved as of 05/07/2018 Secondary to obstructive ureteric stone and dehydration Patient was treated with IV fluids renal function improved to baseline Daily monitoring of labs avoid nephrotoxic agents  The patient's chronic medical conditions were treated accordingly per the patient's home medication regimen except as noted in the plan above and in the  medication list below.   Discharge Condition:   Disposition: Patient discharged to home in stable condition. Discharge Orders  Amb Referral to Diabetes Education  Standing Status: Future  Referral Priority: Routine Referral Type: Consultation  Referral Reason: Specialty Services Required  Number of Visits Requested: 10   Full Code (Outpatient)   Case request operating room: Cystoscopy bilateral retrogrades left ureteroscopy holmium stone manipulation left stent fluoroscopy.  Standing Status: Future Number of Occurrences: 1 Standing Exp. Date: 11/04/18   Order Specific Question Answer Comments  ASA Case Classification ASA III - Pt with severe systemic disease [120]  Case Classification Scheduled (Already in house, Sched (>) 24 hours) [110]   Diet At Discharge   Order Specific Question Answer Comments  Recommended diet at discharge: Diabetic diet   Wound/Incision Care Instructions:   Photo of wound/incision:   Physical Exam at Discharge   BP 142/84   Pulse 102   Temp 98.1 F (36.7 C) (Oral)   Resp 18   Ht 1.854 m (6\' 1" )   Wt 102.7 kg (226 lb 8 oz)   SpO2 99%   BMI 29.88 kg/m  At the time of discharge, the patient was seen and examined by the attending physician, and was deemed appropriate for discharge.  General awake and alert oriented to time place and person HEENT head is atraumatic normocephalic Eyes pupils are round reactive to light and accommodation Neck is supple with full range of motion Respiration is easy nonlabored CVS S1-S2 audible regular rate and rhythm Abdomen is soft nontender bowel sounds are positive  Discharge Medications:   Discharge Medication List as of 05/07/2018 12:58 PM   START taking these medications  Details  amLODIPine (NORVASC) 10 MG tablet Take 1 tablet (10 mg total) by mouth daily for 30 days., Starting Sat 05/08/2018, Until Mon 06/07/2018, Print   cefdinir (OMNICEF) 300 MG capsule Take 1 capsule (300 mg total) by mouth every 12 hours  for 7 days., Starting Fri 05/07/2018, Until Fri 05/14/2018, Print   insulin detemir (LEVEMIR) 100 unit/mL (3 mL) injection Inject 10 Units into the skin nightly., Starting Fri 05/07/2018, Print   insulin lispro (HUMALOG JUNIOR KWIKPEN) 100 unit/mL half-unit pen injection Blood sugar before meal 0-1 50 no coverage 1 50-200 mg/dl 4 units 201-2 50mg /dl 6 units 2 51-300 mg/dl 8 units 301-3 50 mg/dl 10 units Greater than 350 please call MD Patient needs to eat 30 minutes after injecting with the insulin, Print   tamsulosin (FLOMAX) 0.4 mg Cap capsule Take 1 capsule (0.4 mg total) by mouth daily for 30 days., Starting Sat 05/08/2018, Until Mon 06/07/2018, Normal    CONTINUE these medications which have NOT CHANGED  Details  acetaminophen (TYLENOL) 650 MG CR tablet Take 650 mg by mouth every 8 (eight) hours as needed for Pain., Historical Med   cetirizine (ZYRTEC) 10 MG tablet Take 10 mg by mouth daily., Historical Med   pantoprazole (PROTONIX) 40 MG tablet Take 20 mg by mouth daily., Historical Med   tadalafiL (CIALIS/ADCIRCA) 5 MG tablet Take 5 mg by mouth daily., Historical Med    STOP taking these medications   amlodipine-olmesartan (AZOR) 5-40 mg per tablet       Significant Diagnostic Tests:   LABS:  Lab Results  Component Value Date  WBC 11.8 (H) 05/04/2018  HGB 14.4 05/04/2018  HCT 42.1 05/04/2018  PLT 184 05/04/2018  NA 137 05/07/2018  K 4.1 05/07/2018  CL 105 05/07/2018  CREATININE 1.25 05/07/2018  BUN 25 (H) 05/07/2018  CO2 24 05/07/2018   CULTURES:   No results found for any visits on 05/03/18 (from the past 72 hour(s)).  IMAGING:   Recent Results (from the past 59 hour(s))  FL RETROGRADE UROGRAPHY OR ONLY  Narrative  CLINICAL DATA: Distal left ureteral calculus  EXAM: RETROGRADE PYELOGRAM FROM OR  COMPARISON: CT abdomen and pelvis May 03, 2018  FLUOROSCOPY TIME: 0 minutes 50 seconds; 4 submitted images  FINDINGS: Preliminary abdomen radiograph  shows a normal gas pattern. There is retrograde filling of each ureter and collecting system. On the left, there is a 4 mm calculus in the distal left ureter near the ureterovesical junction, corresponding to the calculus seen on recent CT. No other filling defects are identified in the left ureter.  No collecting system lesions are evident on either side. Calices appear delicately cupped on each side. A single ureter on each side follows a normal course to the bladder. No filling defects are appreciated in the right ureter. There is a phlebolith lateral to the right ureter.  IMPRESSION: Calculus in distal left ureter corresponding to findings on recent CT. No other abnormality appreciable. Note that no image showing removal of the calculus and left-sided stent placement is submitted with this study.  Electronically Signed By: Lowella Grip III M.D. On: 05/05/2018 14:41   ECHO results from current admission-last 7 days  ** No results found for the last 168 hours. **    Surgeries/Procedures:   Procedure(s) (LRB): Cystoscopy, Bilateral Retrograde Pyelograms, Left Ureteroscopy, Holmium Laser Stone Manipulation (Stone Obtained) Insertion Left DJ Stent fluoroscopy. (Left) Other procedures performed:   Consults:   IP CONSULT TO UROLOGY IP CONSULT TO HOSPITALIST (GENERAL) IP CONSULT TO PULMONARY / [place] / MICU IP CONSULT TO DIABETES EDUCATOR  Follow-up Appointments:   No future appointments.   Follow-up Information  Cassandria Anger, MD Follow up in 1 week(s).  Specialty: Internal Medicine Why: with blood sugar log Contact information: [place] [address] [phone]       Contact information during this hospitalization (Please re-verify post-discharge): PCP: Cassandria Anger, MD PCP Phone: [phone]    Extended Emergency Contact Information Primary Emergency Contact: [place] Mobile Phone: [phone] Relation:  Friend Secondary Emergency Contact: [facility] Mobile Phone: [phone] Relation: Son  Past Medical History:  Diagnosis Date   Hypertension   Kidney stones   Patient Active Problem List  Diagnosis Date Noted   Diabetic hyperosmolar non-ketotic state ([place]) 05/04/2018   LEFT Ureteric stone 05/04/2018   Barrett's esophagus 05/04/2018   Essential hypertension 05/04/2018   Hyponatremia 05/04/2018   Diabetes mellitus, new onset ([place]) 05/04/2018   Ureteral calculus 05/03/2018   Electronically signed by: Rochel Brome, MD 05/07/2018 4:22 PM Time spent on discharge: 35 minutes.   Electronically signed by: Rochel Brome, MD 05/07/18 1625"     Outpatient Medications Prior to Visit  Medication Sig Dispense Refill   acetaminophen (TYLENOL) 650 MG CR tablet Take 650 mg by mouth every 8 (eight) hours as needed for pain.     amLODipine-olmesartan (AZOR) 5-40 MG tablet TAKE ONE-HALF (1/2) TABLET BY MOUTH DAILY 45 tablet 1   B-D ULTRAFINE III SHORT PEN 31G X 8 MM MISC See admin instructions.     cetirizine (ZYRTEC) 10 MG tablet Take 10 mg by mouth 2 (two) times daily. Takes twice daily     insulin lispro (HUMALOG) 100 UNIT/ML KwikPen Junior Blood sugar before meal 0-1 50 no coverage 1 50-200 mg/dl 4 units 201-2 50mg /dl 6  units 2 51-300 mg/dl 8 units 301-3 50 mg/dl 10 units Greater than 350 please call MD Patient needs to eat 30 minutes after injecting with the insulin     Lancets (ONETOUCH DELICA PLUS [id number]) MISC TEST BLOOD SUGAR 4 TIMES A DAY     LEVEMIR FLEXTOUCH 100 UNIT/ML Pen INJECT 10 UNITS INTO THE SKIN NIGHTLY     ONETOUCH VERIO test strip CHECK BLOOD SUGAR 4 TIMES A DAY     pantoprazole (PROTONIX) 40 MG tablet Take 1 tablet (40 mg total) by mouth daily. 90 tablet 3   tadalafil (CIALIS) 5 MG tablet TAKE 1 TABLET BY MOUTH IN  THE MORNING -DO NOT TAKE IFYOU ARE CURRENTLY TAKING   NITRATES. 30 tablet 9   No facility-administered medications  prior to visit.     ROS: Review of Systems  Constitutional: Positive for unexpected weight change. Negative for appetite change and fatigue.  HENT: Negative for congestion, nosebleeds, sneezing, sore throat and trouble swallowing.   Eyes: Negative for itching and visual disturbance.  Respiratory: Negative for cough.   Cardiovascular: Negative for chest pain, palpitations and leg swelling.  Gastrointestinal: Negative for abdominal distention, blood in stool, diarrhea and nausea.  Genitourinary: Negative for frequency and hematuria.  Musculoskeletal: Negative for back pain, gait problem, joint swelling and neck pain.  Skin: Positive for rash.  Neurological: Negative for dizziness, tremors, speech difficulty and weakness.  Psychiatric/Behavioral: Negative for agitation, dysphoric mood, sleep disturbance and suicidal ideas. The patient is not nervous/anxious.     Objective:  BP 136/82 (BP Location: Left Arm, Patient Position: Sitting, Cuff Size: Normal)    Pulse 82    Temp 98 F (36.7 C) (Oral)    Ht 6\' 1"  (1.854 m)    Wt 217 lb (98.4 kg)    SpO2 97%    BMI 28.63 kg/m   BP Readings from Last 3 Encounters:  05/17/18 136/82  06/02/17 134/72  05/09/16 130/73    Wt Readings from Last 3 Encounters:  05/17/18 217 lb (98.4 kg)  06/02/17 230 lb (104.3 kg)  04/30/16 219 lb (99.3 kg)    Physical Exam Constitutional:      General: He is not in acute distress.    Appearance: He is well-developed.     Comments: NAD  Eyes:     Conjunctiva/sclera: Conjunctivae normal.     Pupils: Pupils are equal, round, and reactive to light.  Neck:     Musculoskeletal: Normal range of motion.     Thyroid: No thyromegaly.     Vascular: No JVD.  Cardiovascular:     Rate and Rhythm: Normal rate and regular rhythm.     Heart sounds: Normal heart sounds. No murmur. No friction rub. No gallop.   Pulmonary:     Effort: Pulmonary effort is normal. No respiratory distress.     Breath sounds: Normal breath  sounds. No wheezing or rales.  Chest:     Chest wall: No tenderness.  Abdominal:     General: Bowel sounds are normal. There is no distension.     Palpations: Abdomen is soft. There is no mass.     Tenderness: There is no abdominal tenderness. There is no guarding or rebound.  Musculoskeletal: Normal range of motion.        General: No tenderness.  Lymphadenopathy:     Cervical: No cervical adenopathy.  Skin:    General: Skin is warm and dry.     Findings: No rash.  Neurological:  Mental Status: He is alert and oriented to person, place, and time.     Cranial Nerves: No cranial nerve deficit.     Motor: No abnormal muscle tone.     Coordination: Coordination normal.     Gait: Gait normal.     Deep Tendon Reflexes: Reflexes are normal and symmetric.  Psychiatric:        Behavior: Behavior normal.        Thought Content: Thought content normal.        Judgment: Judgment normal.    Peeling eryth rash B feet   Lab Results  Component Value Date   WBC 15.1 (H) 05/01/2016   HGB 12.1 (L) 05/01/2016   HCT 35.8 (L) 05/01/2016   PLT 199 05/01/2016   GLUCOSE 140 (H) 05/01/2016   CHOL 238 (H) 03/28/2016   TRIG 194.0 (H) 03/28/2016   HDL 46.10 03/28/2016   LDLDIRECT 191.5 04/15/2013   LDLDIRECT 193.6 04/15/2013   LDLCALC 153 (H) 03/28/2016   ALT 62 04/25/2016   AST 47 (H) 04/25/2016   NA 136 05/01/2016   K 4.2 05/01/2016   CL 102 05/01/2016   CREATININE 1.04 05/01/2016   BUN 15 05/01/2016   CO2 25 05/01/2016   TSH 1.13 03/28/2016   PSA 0.98 03/28/2016   INR 0.95 04/25/2016   HGBA1C 5.9 05/16/2009    No results found.  Assessment & Plan:   There are no diagnoses linked to this encounter.   No orders of the defined types were placed in this encounter.    Follow-up: No follow-ups on file.  Walker Kehr, MD

## 2018-05-17 NOTE — Assessment & Plan Note (Signed)
BP Readings from Last 3 Encounters:  05/17/18 136/82  06/02/17 134/72  05/09/16 130/73

## 2018-05-17 NOTE — Assessment & Plan Note (Signed)
Recurrent - 3/20: a urinary stone #51/stent L side

## 2018-05-17 NOTE — Assessment & Plan Note (Signed)
Due to DM - new onset

## 2018-06-04 ENCOUNTER — Ambulatory Visit: Payer: Self-pay | Admitting: *Deleted

## 2018-06-04 NOTE — Telephone Encounter (Signed)
Left message for pt to return call to office to schedule appt. Pt returned call to office and states that he has been experiencing hives to 95% of his body for the past 3 days. Pt states that he is a diabetic and has recently tired Splenda, which he noted to have an ingredient made with corn which he is allergic to. Pt states that he has been using hydrocortisone cream, benadryl, and takes 2 zyrtecs a day but still has severe itching that causes him not to sleep at night. Pt advised that he would need to seek treatment at Urgent Care for current symptoms. No appts available at [facility] at this time. Pt verbalized understanding but states he does not want to go to the Urgent Care due to the potential of being exposed to other things and wanted to know if a medication could be called in for the itching. Pt states that the hives currently are the worse that he has experienced. Explained to pt that the office was closed and the recommendation would be for him to go to Urgent Care to have the hives evaluated. Pt verbalized understanding.   Reason for Disposition . [1] MODERATE-SEVERE hives persist (i.e., hives interfere with normal activities or work) AND [2] taking antihistamine (e.g., Benadryl, Claritin) > 24 hours  Answer Assessment - Initial Assessment Questions 1. APPEARANCE: "What does the rash look like?"      Small bumps that are merging into bigger ones 2. LOCATION: "Where is the rash located?"      Pt states it is over 95% of body 3. NUMBER: "How many hives are there?"      many 4. SIZE: "How big are the hives?" (inches, cm, compare to coins) "Do they all look the same or is there lots of variation in shape and size?"      Little but clumped together and raised from the skin 5. ONSET: "When did the hives begin?" (Hours or days ago)      3 days ago 6. ITCHING: "Does it itch?" If so, ask: "How bad is the itch?"    - MILD: doesn't interfere with normal activities   - MODERATE - SEVERE:  interferes with work, school, sleep, or other activities     severe 7. 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 TRIGGERS: "Were you exposed to any new food, plant, cosmetic product or animal just before the hives began?"     Tried Splenda and noted that it was made with corn which is is allergic to 9. OTHER SYMPTOMS: "Do you have any other symptoms?" (e.g., fever, tongue swelling, difficulty breathing, abdominal pain)     No 10. PREGNANCY: "Is there any chance you are pregnant?" "When was your last menstrual period?"       n/a  Protocols used: HIVES-A-AH

## 2018-06-08 NOTE — Telephone Encounter (Signed)
Please advise 

## 2018-06-08 NOTE — Telephone Encounter (Signed)
Do I need to se him for ov or for VOV? Thx

## 2018-06-08 NOTE — Telephone Encounter (Signed)
Pt seen dermatologist yesterday.

## 2018-07-13 ENCOUNTER — Encounter: Payer: Self-pay | Admitting: Internal Medicine

## 2018-07-13 ENCOUNTER — Ambulatory Visit (INDEPENDENT_AMBULATORY_CARE_PROVIDER_SITE_OTHER): Payer: Managed Care, Other (non HMO) | Admitting: Internal Medicine

## 2018-07-13 ENCOUNTER — Other Ambulatory Visit: Payer: Self-pay

## 2018-07-13 DIAGNOSIS — E1165 Type 2 diabetes mellitus with hyperglycemia: Secondary | ICD-10-CM | POA: Insufficient documentation

## 2018-07-13 NOTE — Progress Notes (Signed)
Virtual Visit via Video Note  I connected with Corey [name] on 07/13/18 at 8:10AM by a video enabled telemedicine application and verified that I am speaking with the correct person using two identifiers.   I discussed the limitations of evaluation and management by telemedicine and the availability of in person appointments. The patient expressed understanding and agreed to proceed.   -Location of the patient : Home  -Location of the provider : Office -The names of all persons participating in the telemedicine service : Pt and myself         Name: Corey [name]  MRN/ DOB: [id number], 29-Jan-1951   Age/ Sex: 68 y.o., male    PCP: Plotnikov, Evie Lacks, MD   Reason for Endocrinology Evaluation: Type 2 Diabetes Mellitus     Date of Initial Endocrinology Visit: 07/13/2018     PATIENT IDENTIFIER: Corey [name] is a 68 y.o. male with a past medical history of HTN ,, Hives, T2DM, and Hx of nephrolithiasis. The patient presented for initial endocrinology clinic visit on 07/13/2018 for consultative assistance with his diabetes management.    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 Has been avoiding sugar-sweetened beverages since his hospitalization.    Pt has a diagnosis of chronic urticaria for years, his last attack of this was in January, 2020 and having to go on prednisone 80 mg daily with injections as well, that he  attributes his newly diagnosed DM to.     HOME DIABETES REGIMEN: Levemir 10 units daily  Humalog SS  Statin: no ACE-I/ARB: held due to hyperkalemia Prior Diabetic Education: yes   GLUCOSE LOG: Date  Fasting  Daily average   07/13/18 118   5/18 90 146  5/17 125 172  5/16 133 155    DIABETIC COMPLICATIONS: Microvascular complications:    Denies: retinopathy, neuropathy, CKD  Last eye exam: Completed 04/2018  Macrovascular complications:    Denies: CAD, PVD, CVA   PAST HISTORY: Past Medical History:  Past Medical History:  Diagnosis Date  . Allergic rhinitis   . Arthritis   . Asthma   . Barrett's esophagus   . Complication of anesthesia    "took them 6 hours to wake me up"  . GERD (gastroesophageal reflux disease)   . Heart murmur   . History of kidney stones   . HTN (hypertension)   . Hyperlipidemia   . Nephrolithiasis   . Pneumonia    as a kid    Past Surgical History:  Past Surgical History:  Procedure Laterality Date  . COLONOSCOPY    . ESOPHAGOGASTRODUODENOSCOPY  03/20/2006  . EYE SURGERY    . TONSILLECTOMY    . TOTAL HIP ARTHROPLASTY Right 04/30/2016   Procedure: RIGHT TOTAL HIP ARTHROPLASTY ANTERIOR APPROACH;  Surgeon: Gaynelle Arabian, MD;  Location: [facility];  Service: Orthopedics;  Laterality: Right;  requests 155mins  .  TRANSTHORACIC ECHOCARDIOGRAM  01/05/1998      Social History:  reports that he has never smoked. He has never used smokeless tobacco. He reports that he does not drink alcohol or use drugs. Family History:  Family History  Problem Relation Age of Onset  . Colon polyps Mother   . Heart disease Mother   . Hypertension Unknown       HOME MEDICATIONS: Allergies as of 07/13/2018      Reactions   Albuterol    REACTION: "feels like drowning"   Aspirin Other (See Comments)   Causes bruising   Ciprofloxacin    REACTION: rash   Clarithromycin    REACTION: swelling   Colesevelam    REACTION: constip   Enalapril Maleate     REACTION: Erectile dysfunction   Esomeprazole Magnesium    REACTION: epigastric pain   Niacin    REACTION: diarrhea   Pravastatin Sodium    REACTION: aching, listless   Rosuvastatin    REACTION: achy   Penicillins Hives   Has patient had a PCN reaction causing immediate rash, facial/tongue/throat swelling, SOB or lightheadedness with hypotension: Yes Has patient had a PCN reaction causing severe rash involving mucus membranes or skin necrosis: Yes Has patient had a PCN reaction that required hospitalization No Has patient had a PCN reaction occurring within the last 10 years: No If all of the above answers are "NO", then may proceed with Cephalosporin use.      Medication List       Accurate as of Jul 13, 2018  7:32 AM. If you have any questions, ask your nurse or doctor.        acetaminophen 650 MG CR tablet Commonly known as:  TYLENOL Take 650 mg by mouth every 8 (eight) hours as needed for pain.   amLODipine-olmesartan 5-40 MG tablet Commonly known as:  AZOR TAKE ONE-HALF (1/2) TABLET BY MOUTH DAILY   B-D ULTRAFINE III SHORT PEN 31G X 8 MM Misc Generic drug:  Insulin Pen Needle Inject 1 Units into the skin 4 (four) times daily.   cetirizine 10 MG tablet Commonly known as:  ZYRTEC Take 10 mg by mouth 2 (two) times daily. Takes twice daily   insulin lispro 100 UNIT/ML KwikPen Junior Commonly known as:  HUMALOG Blood sugar before meal 0-1 50 no coverage 1 50-200 mg/dl 4 units 201-2 50mg /dl 6 units 2 51-300 mg/dl 8 units 301-3 50 mg/dl 10 units Greater than 350 please call MD Patient needs to eat 30 minutes after injecting with the insulin   Levemir FlexTouch 100 UNIT/ML Pen Generic drug:  Insulin Detemir INJECT 10 UNITS INTO THE SKIN NIGHTLY   OneTouch Delica Plus [id number] Misc Inject 1 Units into the skin 4 (four) times daily.   OneTouch Verio test strip Generic drug:  glucose blood CHECK BLOOD SUGAR 4 TIMES A DAY   pantoprazole 40 MG tablet Commonly  known as:  PROTONIX Take 1 tablet (40 mg total) by mouth daily.   tadalafil 5 MG tablet Commonly known as:  Cialis TAKE 1 TABLET BY MOUTH IN  THE MORNING -DO NOT TAKE IFYOU ARE CURRENTLY TAKING   NITRATES.        ALLERGIES: Allergies  Allergen Reactions  . Albuterol     REACTION: "feels like drowning"  . Aspirin Other (See Comments)    Causes bruising  . Ciprofloxacin     REACTION: rash  . Clarithromycin     REACTION: swelling  . Colesevelam     REACTION:  constip  . Enalapril Maleate     REACTION: Erectile dysfunction  . Esomeprazole Magnesium     REACTION: epigastric pain  . Niacin     REACTION: diarrhea  . Pravastatin Sodium     REACTION: aching, listless  . Rosuvastatin     REACTION: achy  . Penicillins Hives    Has patient had a PCN reaction causing immediate rash, facial/tongue/throat swelling, SOB or lightheadedness with hypotension: Yes Has patient had a PCN reaction causing severe rash involving mucus membranes or skin necrosis: Yes Has patient had a PCN reaction that required hospitalization No Has patient had a PCN reaction occurring within the last 10 years: No If all of the above answers are "NO", then may proceed with Cephalosporin use.      REVIEW OF SYSTEMS: A comprehensive ROS was conducted with the patient and is negative except as per HPI and below:  Review of Systems  Constitutional: Negative for fever.  HENT: Negative for congestion and sore throat.   Eyes: Negative for blurred vision and pain.  Respiratory: Negative for cough and shortness of breath.   Cardiovascular: Negative for chest pain and palpitations.  Gastrointestinal: Negative for diarrhea and nausea.  Genitourinary: Positive for frequency.  Skin: Positive for rash.  Neurological: Negative for tingling and tremors.  Endo/Heme/Allergies: Positive for polydipsia.  Psychiatric/Behavioral: Negative for depression. The patient is not nervous/anxious.         DATA REVIEWED:   Lab Results  Component Value Date   HGBA1C 5.9 05/16/2009   Lab Results  Component Value Date   LDLCALC 153 (H) 03/28/2016   CREATININE 1.22 05/17/2018    Lab Results  Component Value Date   CHOL 238 (H) 03/28/2016   HDL 46.10 03/28/2016   LDLCALC 153 (H) 03/28/2016   LDLDIRECT 191.5 04/15/2013   LDLDIRECT 193.6 04/15/2013   TRIG 194.0 (H) 03/28/2016   CHOLHDL 5 03/28/2016        ASSESSMENT / PLAN / RECOMMENDATIONS:   1) Type 2 Diabetes Mellitus, Newly Diagnosed , Without complications - Most recent A1c of 10.2 %. Goal A1c < 7.0 %.    Plan: GENERAL:  Praised the patient on lifestyle changes and medication adherence I have discussed with the patient the pathophysiology of diabetes. We went over the natural progression of the disease. We talked about both insulin resistance and insulin deficiency. We stressed the importance of lifestyle changes including diet and exercise. I explained the complications associated with diabetes including retinopathy, nephropathy, neuropathy as well as increased risk of cardiovascular disease. We went over the benefit seen with glycemic control.    I explained to the patient that diabetic patients are at higher than normal risk for amputations. We discussed adding metformin to his regimen, but the patient states he is a believer in natural remedies and is scared of western medicine, he has a lot of patient on metformin and is scared of side effects, when asked about his main concern of side effect, pt mentions ED and GI symptoms. We discussed that ED is caused by DM rather then metformin, we discussed GI side effects of metformin and how extended release formulations are better tolerated. We also discussed insulin side effects such as weight gain and hypoglycemia, we discussed fatal consequences with severe hypoglycemia. After this conversation, he seems to be more accepting of metformin.   At this point we will not be making any changes, he was asked  to start checking bedtime glucose as well.     MEDICATIONS:  Continue Levemir 10 units QHS  Continue Humalog per SS   EDUCATION / INSTRUCTIONS:  BG monitoring instructions: Patient is instructed to check his blood sugars 4 times a day, before meals and bedtime.  Call [facility] if: BG persistently < 70 or > 300. . I reviewed the Rule of 15 for the treatment of hypoglycemia in detail with the patient. Literature supplied.   2) Diabetic complications:   Eye: Does not have known diabetic retinopathy.   Neuro/ Feet: Does not have known diabetic peripheral neuropathy.  Renal: Patient does not have known baseline CKD. He is on an ACEI/ARB at present.Check urine albumin/creatinine ratio yearly starting at time of diagnosis. If albuminuria is positive, treatment is geared toward better glucose, blood pressure control and use of ACE inhibitors or ARBs. Monitor electrolytes and creatinine once to twice yearly.   3) Lipids: Patient is not on a statin.  Discussed the benefit of statins in reducing the risk of cardiovascular disease as well as ADA recommendations. Pt states he is unable to tolerate statins due to myalgia. I have discussed with him that they are beneficial even if taken a couple of times a week as tolerated, he is already on vitamin D. It will be hard to convince him on this, especially when he is scared of western medicine, all I can do at this point id present him with the data.    4) Hypertension: Historically he has been  at goal of < 140/90 mmHg, with half a tablet of Azor.    I discussed the assessment and treatment plan with the patient. The patient was provided an opportunity to ask questions and all were answered. The patient agreed with the plan and demonstrated an understanding of the instructions.   The patient was advised to call back or seek an in-person evaluation if the symptoms worsen or if the condition fails to improve as anticipated.  F/u  in 8 weeks    Signed electronically by: Mack Guise, MD  [facility] Endocrinology  [facility] [address] [place], [place] [address] Phone: [phone] FAX: [phone]   CC: Cassandria Anger, MD [place] [address] Phone: [phone]  Fax: [phone]    Return to Endocrinology clinic as below: Future Appointments  Date Time Provider [place]  07/13/2018  8:10 AM Shamleffer, Melanie Crazier, MD [facility] None

## 2018-09-13 ENCOUNTER — Other Ambulatory Visit: Payer: Self-pay

## 2018-09-15 ENCOUNTER — Other Ambulatory Visit: Payer: Self-pay

## 2018-09-15 ENCOUNTER — Ambulatory Visit (INDEPENDENT_AMBULATORY_CARE_PROVIDER_SITE_OTHER): Payer: Managed Care, Other (non HMO) | Admitting: Internal Medicine

## 2018-09-15 DIAGNOSIS — E1165 Type 2 diabetes mellitus with hyperglycemia: Secondary | ICD-10-CM

## 2018-09-15 MED ORDER — LEVEMIR FLEXTOUCH 100 UNIT/ML ~~LOC~~ SOPN: 6.0000 [IU] | PEN_INJECTOR | Freq: Every day | SUBCUTANEOUS | 1 refills | Status: DC

## 2018-09-15 NOTE — Progress Notes (Signed)
Virtual Visit via Video Note  I connected withMichael D [name] on 09/15/18 at 8:10AM by a video enabled telemedicine application and verified that I am speaking with the correct person using two identifiers.  I discussed the limitations of evaluation and management by telemedicine and the availability of in person appointments. The patient expressed understanding and agreed to proceed.   -Location of the patient : Home  -Location of the provider : Office -The names of all persons participating in the telemedicine service : Pt and myself           Name: Corey [name]  Age/ Sex: 68 y.o., male   MRN/ DOB: [id number], 1950-08-24     PCP: Cassandria Anger, MD   Reason for Endocrinology Evaluation: Type 2 Diabetes Mellitus  Initial Endocrine Consultative Visit: 07/13/2018    PATIENT IDENTIFIER: Corey [name] is a 68 y.o. male with a past medical history of HTN , Chronic Urticaria , T2DM, and Hx of nephrolithiasis . The patient has followed with Endocrinology clinic since 07/13/2018 for consultative assistance with management of his diabetes.  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 He was started on an MDI regimen. Pt is not a big fan of western medicine.    Pt has a diagnosis of chronic urticaria for years, his last attack of this was in January, 2020 and having to go on prednisone 80 mg daily with injections as well, that he attributes his newly diagnosed DM to  SUBJECTIVE:   During the last visit (07/13/2018): MDI regimen was continued, pt reluctant to start Metformin.   Today (09/15/2018): Corey [name] is here for a 6 week follow up on his diabetes management.  He checks his blood sugars 4 times daily, preprandial and bedtime The patient has not had hypoglycemic episodes since the last clinic visit. He had stopped using humalog on  09/05/2018. He walks daily for an average 2.5 miles a day, he has cut down on his CHO intake.   ROS: As per HPI and as detailed below: Review of Systems  Constitutional: Negative for fever and weight loss.  HENT: Negative for congestion and sore throat.   Respiratory: Negative for cough and shortness of breath.   Cardiovascular: Negative for chest pain and palpitations.  Gastrointestinal: Negative for diarrhea and nausea.      HOME DIABETES REGIMEN:  Levemir 10 units daily      GLUCOSE LOG: Date Breakfast  Lunch  Dinner Bedtime  09/15/18 98     7/21 103 95 131 125  7/20 115 90  114  7/19 105 110  156      HISTORY:  Past Medical History:  Past Medical History:  Diagnosis Date  . Allergic rhinitis   . Arthritis   . Asthma   . Barrett's esophagus   . Complication of anesthesia    "took them 6 hours to wake me up"  . GERD (gastroesophageal reflux disease)   . Heart murmur   . History of kidney stones   . HTN (hypertension)   . Hyperlipidemia   . Nephrolithiasis   . Pneumonia    as a kid    Past Surgical History:  Past Surgical History:  Procedure Laterality Date  . COLONOSCOPY    . ESOPHAGOGASTRODUODENOSCOPY  03/20/2006  . EYE SURGERY    . TONSILLECTOMY    . TOTAL HIP ARTHROPLASTY Right  04/30/2016   Procedure: RIGHT TOTAL HIP ARTHROPLASTY ANTERIOR APPROACH;  Surgeon: Gaynelle Arabian, MD;  Location: [facility];  Service: Orthopedics;  Laterality: Right;  requests 144mins  . TRANSTHORACIC ECHOCARDIOGRAM  01/05/1998     Social History:  reports that he has never smoked. He has never used smokeless tobacco. He reports that he does not drink alcohol or use drugs. Family History:  Family History  Problem Relation Age of Onset  . Colon polyps Mother   . Heart disease Mother   . Diabetes Mother   . Hypertension Other       HOME MEDICATIONS: Allergies as of 09/15/2018      Reactions   Albuterol    REACTION: "feels like drowning"   Aspirin Other (See Comments)    Causes bruising   Ciprofloxacin    REACTION: rash   Clarithromycin    REACTION: swelling   Colesevelam    REACTION: constip   Enalapril Maleate    REACTION: Erectile dysfunction   Esomeprazole Magnesium    REACTION: epigastric pain   Niacin    REACTION: diarrhea   Pravastatin Sodium    REACTION: aching, listless   Rosuvastatin    REACTION: achy   Penicillins Hives   Has patient had a PCN reaction causing immediate rash, facial/tongue/throat swelling, SOB or lightheadedness with hypotension: Yes Has patient had a PCN reaction causing severe rash involving mucus membranes or skin necrosis: Yes Has patient had a PCN reaction that required hospitalization No Has patient had a PCN reaction occurring within the last 10 years: No If all of the above answers are "NO", then may proceed with Cephalosporin use.      Medication List       Accurate as of September 15, 2018  7:34 AM. If you have any questions, ask your nurse or doctor.        acetaminophen 650 MG CR tablet Commonly known as: TYLENOL Take 650 mg by mouth every 8 (eight) hours as needed for pain.   amLODipine-olmesartan 5-40 MG tablet Commonly known as: AZOR TAKE ONE-HALF (1/2) TABLET BY MOUTH DAILY   B-D ULTRAFINE III SHORT PEN 31G X 8 MM Misc Generic drug: Insulin Pen Needle Inject 1 Units into the skin 4 (four) times daily.   cetirizine 10 MG tablet Commonly known as: ZYRTEC Take 10 mg by mouth 2 (two) times daily. Takes twice daily   insulin lispro 100 UNIT/ML KwikPen Junior Commonly known as: HUMALOG Blood sugar before meal 0-1 50 no coverage 1 50-200 mg/dl 4 units 201-2 50mg /dl 6 units 2 51-300 mg/dl 8 units 301-3 50 mg/dl 10 units Greater than 350 please call MD Patient needs to eat 30 minutes after injecting with the insulin   Levemir FlexTouch 100 UNIT/ML Pen Generic drug: Insulin Detemir INJECT 10 UNITS INTO THE SKIN NIGHTLY   OneTouch Delica Plus [id number] Misc Inject 1 Units into the skin 4  (four) times daily.   OneTouch Verio test strip Generic drug: glucose blood CHECK BLOOD SUGAR 4 TIMES A DAY   pantoprazole 40 MG tablet Commonly known as: PROTONIX Take 1 tablet (40 mg total) by mouth daily.   tadalafil 5 MG tablet Commonly known as: Cialis TAKE 1 TABLET BY MOUTH IN  THE MORNING -DO NOT TAKE IFYOU ARE CURRENTLY TAKING   NITRATES.         DATA REVIEWED:   Lab Results  Component Value Date   LDLCALC 153 (H) 03/28/2016   CREATININE 1.22 05/17/2018     ASSESSMENT /  PLAN / RECOMMENDATIONS:  1) Type 2 Diabetes Mellitus, Newly Diagnosed , Without complications - Most recent A1c of 10.2 %. Goal A1c < 7.0 %.    - He has done a great job with diet and lifestyle changes, to where he is at a point where he is not needing any prandial insulin, I gave him the option of stopping Levemir all together but he is not comfortable doing this yet, so we decided to reduce it to 6 units instead.  - I again gave him the option of stopping insulin and switching to a small dose of Metfomrin, but as discussed on last visit, he is not a fan of western medicine and has some skepticism.  - I reassured him again that metformin does not cause kidney damage but diabetes would, and the reason for monitoring renal function while on metformin is to adjust metformin dose based on GFR.     Plan: MEDICATIONS:  Decrease Levemir to 6 units   Stop Humalog   EDUCATION / INSTRUCTIONS:  BG monitoring instructions: Patient is instructed to check his blood sugars 2 times a day.  Call [place] [facility] if: BG persistently < 70 or > 300. . I reviewed the Rule of 15 for the treatment of hypoglycemia in detail with the patient. Literature supplied.    I discussed the assessment and treatment plan with the patient. The patient was provided an opportunity to ask questions and all were answered. The patient agreed with the plan and demonstrated an understanding of the instructions.  The  patient was advised to call back or seek an in-person evaluation if the symptoms worsen or if the condition fails to improve as anticipated.   F/U in 3 months    Signed electronically by: Mack Guise, MD  [facility] Endocrinology  [facility] [address] [place], [place] [address] Phone: [phone] FAX: [phone]   CC: Cassandria Anger, MD [place] [address] Phone: [phone]  Fax: [phone]  Return to Endocrinology clinic as below: Future Appointments  Date Time Provider [place]  09/15/2018  8:10 AM Shamleffer, Melanie Crazier, MD [facility] None

## 2018-12-08 ENCOUNTER — Encounter: Payer: Self-pay | Admitting: Gastroenterology

## 2018-12-17 ENCOUNTER — Encounter: Payer: Self-pay | Admitting: Gastroenterology

## 2018-12-28 ENCOUNTER — Ambulatory Visit: Payer: Managed Care, Other (non HMO) | Admitting: Internal Medicine

## 2019-01-05 ENCOUNTER — Other Ambulatory Visit: Payer: Self-pay

## 2019-01-05 NOTE — Progress Notes (Signed)
Name: Corey [name]  Age/ Sex: 68 y.o., male   MRN/ DOB: [id number], [birth year]     PCP: Cassandria Anger, MD   Reason for Endocrinology Evaluation: Type 2 Diabetes Mellitus  Initial Endocrine Consultative Visit: 07/13/2018    PATIENT IDENTIFIER: Corey [name] is a 68 y.o. male with a past medical history of HTN , Chronic Urticaria , T2DM, and Hx of nephrolithiasis . The patient has followed with Endocrinology clinic since 07/13/2018 for consultative assistance with management of his diabetes.  DIABETIC HISTORY:  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 He was started on an MDI regimen. Pt is not a big fan of western medicine.    Pt has a diagnosis of chronic urticaria for years, his last attack of this was in January, 2020 and having to go on prednisone 80 mg daily with injections as well, that he attributes his newly diagnosed DM to   Prandial insulin stopped 08/2018, pt was offered Metformin but he opted to continue with basal insulin at the time.   SUBJECTIVE:   During the last visit (09/15/2018): Humalog stopped, basal insulin was continue per pt choice. Pt  reluctant to start Metformin.   Today (01/07/2019): [name] is here for a 3 month follow up on his diabetes management.  He checks his blood sugars 4 times daily, preprandial and bedtime The patient has not had hypoglycemic episodes since the last clinic visit. He had stopped using levemir ~ 2.5 weeks ago, and humalog on 11/6th. He walks daily for an average 2.5 miles a day.     ROS: As per HPI and as detailed below: Review of Systems  Constitutional: Negative for fever and weight loss.  HENT: Negative for congestion and sore throat.   Respiratory: Negative for cough and shortness of breath.   Cardiovascular: Negative for chest pain and palpitations.  Gastrointestinal: Negative for diarrhea  and nausea.  Neurological: Negative for tingling and tremors.      HOME DIABETES REGIMEN:  Levemir 6 units daily - stopped 2.5 weeks ago  Humalog - last dose 11/6      GLUCOSE LOG: Date Breakfast  Lunch  Dinner Bedtime  01/06/19 115 112 99 118  11/11 123 82 105 122  11/10 118 100 79 107  11/9 106 91 101 120      HISTORY:  Past Medical History:  Past Medical History:  Diagnosis Date  . Allergic rhinitis   . Arthritis   . Asthma   . Barrett's esophagus   . Complication of anesthesia    "took them 6 hours to wake me up"  . GERD (gastroesophageal reflux disease)   . Heart murmur   . History of kidney stones   . HTN (hypertension)   . Hyperlipidemia   . Nephrolithiasis   . Pneumonia    as a kid   Past Surgical History:  Past Surgical History:  Procedure Laterality Date  . COLONOSCOPY    . ESOPHAGOGASTRODUODENOSCOPY  03/20/2006  . EYE SURGERY    . TONSILLECTOMY    . TOTAL HIP ARTHROPLASTY Right 04/30/2016   Procedure: RIGHT TOTAL HIP ARTHROPLASTY ANTERIOR APPROACH;  Surgeon: Gaynelle Arabian, MD;  Location: [facility];  Service: Orthopedics;  Laterality: Right;  requests 142mins  . TRANSTHORACIC ECHOCARDIOGRAM  01/05/1998    Social History:  reports that he has never smoked. He has never used smokeless tobacco. He  reports that he does not drink alcohol or use drugs. Family History:  Family History  Problem Relation Age of Onset  . Colon polyps Mother   . Heart disease Mother   . Diabetes Mother   . Hypertension Other      HOME MEDICATIONS: Allergies as of 01/07/2019      Reactions   Albuterol    REACTION: "feels like drowning"   Aspirin Other (See Comments)   Causes bruising   Ciprofloxacin    REACTION: rash   Clarithromycin    REACTION: swelling   Colesevelam    REACTION: constip   Enalapril Maleate    REACTION: Erectile dysfunction   Esomeprazole Magnesium    REACTION: epigastric pain   Niacin    REACTION: diarrhea   Pravastatin Sodium     REACTION: aching, listless   Rosuvastatin    REACTION: achy   Penicillins Hives   Has patient had a PCN reaction causing immediate rash, facial/tongue/throat swelling, SOB or lightheadedness with hypotension: Yes Has patient had a PCN reaction causing severe rash involving mucus membranes or skin necrosis: Yes Has patient had a PCN reaction that required hospitalization No Has patient had a PCN reaction occurring within the last 10 years: No If all of the above answers are "NO", then may proceed with Cephalosporin use.      Medication List       Accurate as of January 07, 2019 10:38 AM. If you have any questions, ask your nurse or doctor.        acetaminophen 650 MG CR tablet Commonly known as: TYLENOL Take 650 mg by mouth every 8 (eight) hours as needed for pain.   amLODipine-olmesartan 5-40 MG tablet Commonly known as: AZOR TAKE ONE-HALF (1/2) TABLET BY MOUTH DAILY   B-D ULTRAFINE III SHORT PEN 31G X 8 MM Misc Generic drug: Insulin Pen Needle Inject 1 Units into the skin 4 (four) times daily.   cetirizine 10 MG tablet Commonly known as: ZYRTEC Take 10 mg by mouth 2 (two) times daily. Takes twice daily   Levemir FlexTouch 100 UNIT/ML Pen Generic drug: Insulin Detemir Inject 6 Units into the skin daily. INJECT 10 UNITS INTO THE SKIN NIGHTLY   OneTouch Delica Plus 123456 Misc Inject 1 Units into the skin 4 (four) times daily.   OneTouch Verio test strip Generic drug: glucose blood CHECK BLOOD SUGAR 4 TIMES A DAY   pantoprazole 40 MG tablet Commonly known as: PROTONIX Take 1 tablet (40 mg total) by mouth daily.   tadalafil 5 MG tablet Commonly known as: Cialis TAKE 1 TABLET BY MOUTH IN  THE MORNING -DO NOT TAKE IFYOU ARE CURRENTLY TAKING   NITRATES.       PHYSICAL EXAM: VS: BP 140/68 (BP Location: Left Arm, Patient Position: Sitting, Cuff Size: Normal)   Pulse 95   Ht 6\' 1"  (1.854 m)   Wt 216 lb 9.6 oz (98.2 kg)   SpO2 98%   BMI 28.58 kg/m    EXAM:  General: Pt appears well and is in NAD  Eyes: External eye exam normal without stare, lid lag or exophthalmos.  EOM intact.  PERRL.  Neck: General: Supple without adenopathy. Thyroid: Thyroid size normal.  No goiter or nodules appreciated. No thyroid bruit.  Lungs: Clear with good BS bilat with no rales, rhonchi, or wheezes  Heart: Auscultation: RRR with normal S1 and S2, no gallops or murmurs Carotid arteries: no bruits Periph. circulation: no peripheral edema  Abdomen: Normoactive bowel sounds, soft, nontender,  without masses or organomegaly palpable  Lymphatics:   Extremities:  BL LE: no pretibial edema normal ROM and strength, no joint enlargement or tenderness  Mental Status: Judgment, insight: intact Orientation: oriented to time, place, and person Mood and affect: no depression, anxiety, or agitation   DM Foot Exam 01/07/2019 The skin of the feet is intact without sores or ulcerations. Right subungual hemtoma The pedal pulses are 2+ on right and 2+ on left. The sensation is intact to a screening 5.07, 10 gram monofilament bilaterally   DATA REVIEWED:   Lab Results  Component Value Date   LDLCALC 153 (H) 03/28/2016   CREATININE 1.22 05/17/2018    Results for [name], Corey D "MIKE" (MRN [id number]) as of 01/10/2019 08:15  Ref. Range 01/07/2019 10:38  Creatinine,U Latest Units: mg/dL 84.6  Microalb, Ur Latest Ref Range: 0.0 - 1.9 mg/dL <0.7  MICROALB/CREAT RATIO Latest Ref Range: 0.0 - 30.0 mg/g 0.8   ASSESSMENT / PLAN / RECOMMENDATIONS:  1) Type 2 Diabetes Mellitus, Optimally Controlled , Without complications - Most recent A1c of 6.1 %. Goal A1c < 7.0 %.    - He has done a great job with diet and lifestyle changes, to where he is at a point where he is not needing any prandial insulin, I gave him the option of stopping Levemir all together but he is not comfortable doing this yet, so we decided to reduce it to 6 units instead.  - I again gave him the option of  stopping insulin and switching to a small dose of Metfomrin, but as discussed on last visit, he is not a fan of western medicine and has some skepticism.  - I reassured him again that metformin does not cause kidney damage but diabetes would, and the reason for monitoring renal function while on metformin is to adjust metformin dose based on GFR.     Plan: MEDICATIONS:  Stop Levemir  Stop Humalog   EDUCATION / INSTRUCTIONS:  BG monitoring instructions: Patient is instructed to check his blood sugars 2 times a day.  Call [facility] if: BG persistently < 70 or > 300. . I reviewed the Rule of 15 for the treatment of hypoglycemia in detail with the patient. Literature supplied.       F/U in 3 months    Signed electronically by: Mack Guise, MD  [facility]  [facility] [address] [place], [place] [address] Phone: [phone] FAX: [phone]   CC: Cassandria Anger, MD [place] [address] Phone: [phone]  Fax: [phone]  Return to Endocrinology clinic as below: Future Appointments  Date Time Provider [place]  01/19/2019  8:00 AM [facility] PREVISIT RM50 [facility]  02/04/2019  8:00 AM Ladene Artist, MD [facility]  04/13/2019  9:10 AM , Melanie Crazier, MD [facility] None

## 2019-01-07 ENCOUNTER — Ambulatory Visit (INDEPENDENT_AMBULATORY_CARE_PROVIDER_SITE_OTHER): Payer: Managed Care, Other (non HMO) | Admitting: Internal Medicine

## 2019-01-07 ENCOUNTER — Encounter: Payer: Self-pay | Admitting: Internal Medicine

## 2019-01-07 VITALS — BP 140/68 | HR 95 | Ht 73.0 in | Wt 216.6 lb

## 2019-01-07 DIAGNOSIS — E1165 Type 2 diabetes mellitus with hyperglycemia: Secondary | ICD-10-CM

## 2019-01-07 LAB — MICROALBUMIN / CREATININE URINE RATIO
Creatinine,U: 84.6 mg/dL
Microalb Creat Ratio: 0.8 mg/g (ref 0.0–30.0)
Microalb, Ur: <0.7 mg/dL (ref 0.0–1.9)

## 2019-01-07 NOTE — Patient Instructions (Signed)
-   Please continue to withhold insulin.  - Stop by the lab today for a urine test

## 2019-01-10 LAB — POCT GLYCOSYLATED HEMOGLOBIN (HGB A1C): Hemoglobin A1C: 6.1 % — AB (ref 4.0–5.6)

## 2019-01-19 ENCOUNTER — Other Ambulatory Visit: Payer: Self-pay

## 2019-01-19 ENCOUNTER — Encounter: Payer: Self-pay | Admitting: Gastroenterology

## 2019-01-19 ENCOUNTER — Ambulatory Visit (AMBULATORY_SURGERY_CENTER): Payer: Managed Care, Other (non HMO) | Admitting: *Deleted

## 2019-01-19 VITALS — Temp 97.3°F | Ht 73.0 in | Wt 219.0 lb

## 2019-01-19 DIAGNOSIS — Z1159 Encounter for screening for other viral diseases: Secondary | ICD-10-CM

## 2019-01-19 DIAGNOSIS — Z1211 Encounter for screening for malignant neoplasm of colon: Secondary | ICD-10-CM

## 2019-01-19 MED ORDER — SUPREP BOWEL PREP KIT 17.5-3.13-1.6 GM/177ML PO SOLN: 1.0000 | Freq: Once | ORAL | 0 refills | Status: AC

## 2019-01-19 NOTE — Progress Notes (Signed)
No egg or soy allergy known to patient  No issues with past sedation with any surgeries  or procedures, no intubation problems  No diet pills per patient No home 02 use per patient  No blood thinners per patient  Pt denies issues with constipation  No A fib or A flutter  EMMI video sent to pt's e mail   cov test 12-8 at 1210 pm  Suprep $15   Due to the COVID-19 pandemic we are asking patients to follow these guidelines. Please only bring one care partner. Please be aware that your care partner may wait in the car in the parking lot or if they feel like they will be too hot to wait in the car, they may wait in the lobby on the 4th floor. All care partners are required to wear a mask the entire time (we do not have any that we can provide them), they need to practice social distancing, and we will do a Covid check for all patient's and care partners when you arrive. Also we will check their temperature and your temperature. If the care partner waits in their car they need to stay in the parking lot the entire time and we will call them on their cell phone when the patient is ready for discharge so they can bring the car to the front of the building. Also all patient's will need to wear a mask into building.

## 2019-02-01 ENCOUNTER — Other Ambulatory Visit: Payer: Self-pay | Admitting: Gastroenterology

## 2019-02-01 ENCOUNTER — Ambulatory Visit (INDEPENDENT_AMBULATORY_CARE_PROVIDER_SITE_OTHER): Payer: Managed Care, Other (non HMO)

## 2019-02-01 DIAGNOSIS — Z1159 Encounter for screening for other viral diseases: Secondary | ICD-10-CM

## 2019-02-02 LAB — SARS CORONAVIRUS 2 (TAT 6-24 HRS): SARS Coronavirus 2: NEGATIVE

## 2019-02-04 ENCOUNTER — Ambulatory Visit (AMBULATORY_SURGERY_CENTER): Payer: Managed Care, Other (non HMO) | Admitting: Gastroenterology

## 2019-02-04 ENCOUNTER — Encounter: Payer: Self-pay | Admitting: Gastroenterology

## 2019-02-04 ENCOUNTER — Other Ambulatory Visit: Payer: Self-pay

## 2019-02-04 VITALS — BP 112/69 | HR 67 | Temp 97.9°F | Resp 12 | Ht 73.0 in | Wt 219.0 lb

## 2019-02-04 DIAGNOSIS — K5140 Inflammatory polyps of colon without complications: Secondary | ICD-10-CM

## 2019-02-04 DIAGNOSIS — K635 Polyp of colon: Secondary | ICD-10-CM | POA: Diagnosis not present

## 2019-02-04 DIAGNOSIS — D125 Benign neoplasm of sigmoid colon: Secondary | ICD-10-CM

## 2019-02-04 DIAGNOSIS — Z1211 Encounter for screening for malignant neoplasm of colon: Secondary | ICD-10-CM

## 2019-02-04 DIAGNOSIS — D123 Benign neoplasm of transverse colon: Secondary | ICD-10-CM

## 2019-02-04 MED ORDER — SODIUM CHLORIDE 0.9 % IV SOLN: 500.0000 mL | Freq: Once | INTRAVENOUS | Status: DC

## 2019-02-04 NOTE — Op Note (Signed)
[place] Patient Name: Nicco Martinique Procedure Date: 02/04/2019 7:58 AM MRN: [id number] Endoscopist: Ladene Artist , MD Age: 68 Referring MD:  Date of Birth: [birth year] Gender: Male Account #: [id number] Procedure:                Colonoscopy Indications:              Screening for colorectal malignant neoplasm Medicines:                Monitored Anesthesia Care Procedure:                Pre-Anesthesia Assessment:                           - Prior to the procedure, a History and Physical                            was performed, and patient medications and                            allergies were reviewed. The patient's tolerance of                            previous anesthesia was also reviewed. The risks                            and benefits of the procedure and the sedation                            options and risks were discussed with the patient.                            All questions were answered, and informed consent                            was obtained. Prior Anticoagulants: The patient has                            taken no previous anticoagulant or antiplatelet                            agents. ASA Grade Assessment: II - A patient with                            mild systemic disease. After reviewing the risks                            and benefits, the patient was deemed in                            satisfactory condition to undergo the procedure.                           After obtaining informed consent, the colonoscope  was passed under direct vision. Throughout the                            procedure, the patient's blood pressure, pulse, and                            oxygen saturations were monitored continuously. The                            Colonoscope was introduced through the anus and                            advanced to the the cecum, identified by                            appendiceal orifice and  ileocecal valve. The                            ileocecal valve, appendiceal orifice, and rectum                            were photographed. The quality of the bowel                            preparation was good. The colonoscopy was performed                            without difficulty. The patient tolerated the                            procedure well. Scope In: 8:06:16 AM Scope Out: 8:23:26 AM Scope Withdrawal Time: 0 hours 15 minutes 0 seconds  Total Procedure Duration: 0 hours 17 minutes 10 seconds  Findings:                 The perianal and digital rectal examinations were                            normal.                           Two sessile polyps were found in the sigmoid colon                            and transverse colon. The polyps were 6 to 8 mm in                            size. These polyps were removed with a cold snare.                            Resection and retrieval were complete.                           Internal hemorrhoids were found during  retroflexion. The hemorrhoids were medium-sized and                            Grade I (internal hemorrhoids that do not prolapse).                           The exam was otherwise without abnormality on                            direct and retroflexion views. Complications:            No immediate complications. Estimated blood loss:                            None. Estimated Blood Loss:     Estimated blood loss: none. Impression:               - Two 6 to 8 mm polyps in the sigmoid colon and in                            the transverse colon, removed with a cold snare.                            Resected and retrieved.                           - Internal hemorrhoids.                           - The examination was otherwise normal on direct                            and retroflexion views. Recommendation:           - Repeat colonoscopy after studies are complete for                             surveillance based on pathology results.                           - Patient has a contact number available for                            emergencies. The signs and symptoms of potential                            delayed complications were discussed with the                            patient. Return to normal activities tomorrow.                            Written discharge instructions were provided to the                            patient.                           -  Resume previous diet.                           - Continue present medications.                           - Await pathology results. Ladene Artist, MD 02/04/2019 8:26:23 AM This report has been signed electronically.

## 2019-02-04 NOTE — Progress Notes (Signed)
Report to PACU, RN, vss, BBS= Clear.  

## 2019-02-04 NOTE — Progress Notes (Signed)
Temp  JB  Vital Signs  NCPt's states no medical or surgical changes since previsit or office visit.

## 2019-02-04 NOTE — Patient Instructions (Signed)
YOU HAD AN ENDOSCOPIC PROCEDURE TODAY AT THE [facility]:   Refer to the procedure report that was given to you for any specific questions about what was found during the examination.  If the procedure report does not answer your questions, please call your gastroenterologist to clarify.  If you requested that your care partner not be given the details of your procedure findings, then the procedure report has been included in a sealed envelope for you to review at your convenience later.  YOU SHOULD EXPECT: Some feelings of bloating in the abdomen. Passage of more gas than usual.  Walking can help get rid of the air that was put into your GI tract during the procedure and reduce the bloating. If you had a lower endoscopy (such as a colonoscopy or flexible sigmoidoscopy) you may notice spotting of blood in your stool or on the toilet paper. If you underwent a bowel prep for your procedure, you may not have a normal bowel movement for a few days.  Please Note:  You might notice some irritation and congestion in your nose or some drainage.  This is from the oxygen used during your procedure.  There is no need for concern and it should clear up in a day or so.  SYMPTOMS TO REPORT IMMEDIATELY:   Following lower endoscopy (colonoscopy or flexible sigmoidoscopy):  Excessive amounts of blood in the stool  Significant tenderness or worsening of abdominal pains  Swelling of the abdomen that is new, acute  Fever of 100F or higher  For urgent or emergent issues, a gastroenterologist can be reached at any hour by calling [phone].   DIET:  We do recommend a small meal at first, but then you may proceed to your regular diet.  Drink plenty of fluids but you should avoid alcoholic beverages for 24 hours.  ACTIVITY:  You should plan to take it easy for the rest of today and you should NOT DRIVE or use heavy machinery until tomorrow (because of the sedation medicines used during the test).     FOLLOW UP: Our staff will call the number listed on your records 48-72 hours following your procedure to check on you and address any questions or concerns that you may have regarding the information given to you following your procedure. If we do not reach you, we will leave a message.  We will attempt to reach you two times.  During this call, we will ask if you have developed any symptoms of COVID 19. If you develop any symptoms (ie: fever, flu-like symptoms, shortness of breath, cough etc.) before then, please call [phone].  If you test positive for Covid 19 in the 2 weeks post procedure, please call and report this information to us.    If any biopsies were taken you will be contacted by phone or by letter within the next 1-3 weeks.  Please call us at [phone] if you have not heard about the biopsies in 3 weeks.    SIGNATURES/CONFIDENTIALITY: You and/or your care partner have signed paperwork which will be entered into your electronic medical record.  These signatures attest to the fact that that the information above on your After Visit Summary has been reviewed and is understood.  Full responsibility of the confidentiality of this discharge information lies with you and/or your care-partner. 

## 2019-02-04 NOTE — Progress Notes (Signed)
Called to room to assist during endoscopic procedure.  Patient ID and intended procedure confirmed with present staff. Received instructions for my participation in the procedure from the performing physician.  

## 2019-02-08 ENCOUNTER — Telehealth: Payer: Self-pay

## 2019-02-08 NOTE — Telephone Encounter (Signed)
2nd follow up call made.  NAULM 

## 2019-02-08 NOTE — Telephone Encounter (Signed)
Left message on follow up call. 

## 2019-02-09 ENCOUNTER — Encounter: Payer: Self-pay | Admitting: Gastroenterology

## 2019-04-11 ENCOUNTER — Other Ambulatory Visit: Payer: Self-pay

## 2019-04-13 ENCOUNTER — Ambulatory Visit: Payer: Managed Care, Other (non HMO) | Admitting: Internal Medicine

## 2019-04-13 ENCOUNTER — Encounter: Payer: Self-pay | Admitting: Internal Medicine

## 2019-04-13 ENCOUNTER — Other Ambulatory Visit: Payer: Self-pay

## 2019-04-13 VITALS — BP 138/72 | HR 100 | Temp 98.3°F | Ht 73.0 in | Wt 216.4 lb

## 2019-04-13 DIAGNOSIS — E119 Type 2 diabetes mellitus without complications: Secondary | ICD-10-CM | POA: Insufficient documentation

## 2019-04-13 LAB — POCT GLYCOSYLATED HEMOGLOBIN (HGB A1C): Hemoglobin A1C: 6 % — AB (ref 4.0–5.6)

## 2019-04-13 MED ORDER — METFORMIN HCL 500 MG PO TABS: 500.0000 mg | ORAL_TABLET | Freq: Every day | ORAL | 6 refills | Status: DC

## 2019-04-13 NOTE — Patient Instructions (Signed)
-   Please do not use any more insulin as its not safe due to the risk of low sugars.   - You could start taking metformin with supper,if your fasting sugars are consistently over 140 mg/dL  - Check sugar 2-3 times a week

## 2019-04-13 NOTE — Progress Notes (Signed)
Name: Corey [name]  Age/ Sex: 70 y.o., male   MRN/ DOB: [id number], [birth year]     PCP: Cassandria Anger, MD   Reason for Endocrinology Evaluation: Type 2 Diabetes Mellitus  Initial Endocrine Consultative Visit: 07/13/2018    PATIENT IDENTIFIER: Corey [name] is a 69 y.o. male with a past medical history of HTN , Chronic Urticaria , T2DM, and Hx of nephrolithiasis . The patient has followed with Endocrinology clinic since 07/13/2018 for consultative assistance with management of his diabetes.  DIABETIC HISTORY:  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 He was started on an MDI regimen. Pt is not a big fan of western medicine.    Pt has a diagnosis of chronic urticaria for years, his last attack of this was in January, 2020 and having to go on prednisone 80 mg daily with injections as well, that he attributes his newly diagnosed DM to   Prandial insulin stopped 08/2018, pt was offered Metformin but he opted to continue with basal insulin at the time.  Basal insulin stopped 12/2018  SUBJECTIVE:   During the last visit (01/07/2019): A1c 6.1 percent we stopped all MDI regimen.    Today (04/13/2019): [name] is here for a 3 month follow up on his diabetes management.  He checks his blood sugars 4 times daily, preprandial and bedtime The patient has not had hypoglycemic episodes since the last clinic visit. He has been using levemir on as needed basis.  He has used it 3 times in the past 2 weeks.   ROS: As per HPI and as detailed below: Review of Systems  Constitutional: Negative for fever and weight loss.  HENT: Negative for congestion and sore throat.   Respiratory: Negative for cough and shortness of breath.   Cardiovascular: Negative for chest pain and palpitations.  Gastrointestinal: Negative for diarrhea and nausea.  Neurological: Negative for  tingling and tremors.      HOME DIABETES REGIMEN:  levemir 6 units PRN            HISTORY:  Past Medical History:  Past Medical History:  Diagnosis Date  . Allergic rhinitis   . Allergy   . Arthritis   . Asthma   . Barrett's esophagus   . Cataract    removed both eyes with lens replacement Dr Katy Fitch  . Complication of anesthesia    "took them 6 hours to wake me up"  . Diabetes mellitus without complication ([place])   . GERD (gastroesophageal reflux disease)   . Heart murmur   . History of kidney stones   . HTN (hypertension)   . Hyperlipidemia   . Nephrolithiasis   . Pneumonia    as a kid   Past Surgical History:  Past Surgical History:  Procedure Laterality Date  . CATARACT EXTRACTION, BILATERAL     with lens implants   . COLONOSCOPY  01/15/2009   normal   . ESOPHAGOGASTRODUODENOSCOPY  03/20/2006  . EYE SURGERY    . KIDNEY STONE SURGERY     x 4 - most recent 04-2018 at [facility]   . TONSILLECTOMY    . TOTAL HIP ARTHROPLASTY Right 04/30/2016   Procedure: RIGHT TOTAL HIP ARTHROPLASTY ANTERIOR APPROACH;  Surgeon: Gaynelle Arabian, MD;  Location: [facility];  Service: Orthopedics;  Laterality: Right;  requests 131mins  . TRANSTHORACIC ECHOCARDIOGRAM  01/05/1998  . UPPER GASTROINTESTINAL ENDOSCOPY  Social History:  reports that he has never smoked. He has never used smokeless tobacco. He reports that he does not drink alcohol or use drugs. Family History:  Family History  Problem Relation Age of Onset  . Colon polyps Mother   . Heart disease Mother   . Diabetes Mother   . Hypertension Other   . Colon cancer Neg Hx   . Esophageal cancer Neg Hx   . Rectal cancer Neg Hx   . Stomach cancer Neg Hx      HOME MEDICATIONS: Allergies as of 04/13/2019      Reactions   Albuterol    REACTION: "feels like drowning"   Aspirin Other (See Comments)   Causes bruising   Ciprofloxacin    REACTION: rash   Clarithromycin    REACTION: swelling   Colesevelam     REACTION: constip   Enalapril Maleate    REACTION: Erectile dysfunction   Esomeprazole Magnesium    REACTION: epigastric pain   Niacin    REACTION: diarrhea   Pravastatin Sodium    REACTION: aching, listless   Rosuvastatin    REACTION: achy   Penicillins Hives   Has patient had a PCN reaction causing immediate rash, facial/tongue/throat swelling, SOB or lightheadedness with hypotension: Yes Has patient had a PCN reaction causing severe rash involving mucus membranes or skin necrosis: Yes Has patient had a PCN reaction that required hospitalization No Has patient had a PCN reaction occurring within the last 10 years: No If all of the above answers are "NO", then may proceed with Cephalosporin use.      Medication List       Accurate as of April 13, 2019  9:12 AM. If you have any questions, ask your nurse or doctor.        acetaminophen 650 MG CR tablet Commonly known as: TYLENOL Take 650 mg by mouth every 8 (eight) hours as needed for pain.   amLODipine-olmesartan 5-40 MG tablet Commonly known as: AZOR TAKE ONE-HALF (1/2) TABLET BY MOUTH DAILY   B-D ULTRAFINE III SHORT PEN 31G X 8 MM Misc Generic drug: Insulin Pen Needle Inject 1 Units into the skin 4 (four) times daily.   cetirizine 10 MG tablet Commonly known as: ZYRTEC Take 10 mg by mouth 2 (two) times daily. Takes twice daily   cholecalciferol 25 MCG (1000 UNIT) tablet Commonly known as: VITAMIN D3 Take 5,000 Units by mouth daily.   Levemir FlexTouch 100 UNIT/ML Pen Generic drug: Insulin Detemir Inject 6 Units into the skin daily. INJECT 10 UNITS INTO THE SKIN NIGHTLY   Magnesium 400 MG Caps Take by mouth 3 (three) times daily.   OneTouch Delica Plus 123456 Misc Inject 1 Units into the skin 4 (four) times daily.   OneTouch Verio test strip Generic drug: glucose blood CHECK BLOOD SUGAR 4 TIMES A DAY   OVER THE COUNTER MEDICATION K2MK7  200 mcg daily   OVER THE COUNTER MEDICATION Sun Flower  Lecithin 1000 mg BID   pantoprazole 40 MG tablet Commonly known as: PROTONIX Take 1 tablet (40 mg total) by mouth daily.   saw palmetto 160 MG capsule Take 160 mg by mouth 2 (two) times daily.   tadalafil 5 MG tablet Commonly known as: Cialis TAKE 1 TABLET BY MOUTH IN  THE MORNING -DO NOT TAKE IFYOU ARE CURRENTLY TAKING   NITRATES.   tobramycin-dexamethasone ophthalmic solution Commonly known as: TOBRADEX 1 drop 2 (two) times daily.   vitamin C 1000 MG tablet Take 1,000  mg by mouth 3 (three) times daily.   Zinc 22.5 MG Tabs Take by mouth 2 (two) times a week.       PHYSICAL EXAM: VS: BP 138/72 (BP Location: Left Arm, Patient Position: Sitting, Cuff Size: Normal)   Pulse 100   Temp 98.3 F (36.8 C)   Ht 6\' 1"  (1.854 m)   Wt 216 lb 6.4 oz (98.2 kg)   SpO2 98%   BMI 28.55 kg/m    EXAM: General: Pt appears well and is in NAD  Lungs: Clear with good BS bilat with no rales, rhonchi, or wheezes  Heart: Auscultation: RRR   Extremities:  BL LE: no pretibial edema normal ROM and strength, no joint enlargement or tenderness  Mental Status: Judgment, insight: intact Orientation: oriented to time, place, and person Mood and affect: no depression, anxiety, or agitation   DM Foot Exam 01/07/2019 The skin of the feet is intact without sores or ulcerations. Right subungual hemtoma The pedal pulses are 2+ on right and 2+ on left. The sensation is intact to a screening 5.07, 10 gram monofilament bilaterally   DATA REVIEWED:   Lab Results  Component Value Date   MICROALBUR <0.7 01/07/2019   LDLCALC 153 (H) 03/28/2016   CREATININE 1.22 05/17/2018    Results for [name], Corey D "MIKE" (MRN [id number]) as of 01/10/2019 08:15  Ref. Range 01/07/2019 10:38  Creatinine,U Latest Units: mg/dL 84.6  Microalb, Ur Latest Ref Range: 0.0 - 1.9 mg/dL <0.7  MICROALB/CREAT RATIO Latest Ref Range: 0.0 - 30.0 mg/g 0.8   ASSESSMENT / PLAN / RECOMMENDATIONS:  1) Type 2 Diabetes Mellitus,  Optimally Controlled , Without complications - Most recent A1c of 6.0 %. Goal A1c < 7.0 %.    - He continues to do a great job with diet and lifestyle changes, on his last visit in November we have discontinued all insulin regimens, but today he tells me he still has been using Levemir on as-needed basis depending on his bedtime glucose readings.  We did discuss that Levemir is a long-acting insulin and is not going to work with immediate hypoglycemic readings.  I have discussed again the risk of hypoglycemia in the setting of insulin use, and that this practice is not safe use of insulin.  I have advised him that with a longer dose of insulin he could die. -I have revisited starting Metformin on him, he agreed to start a small dose with supper.  Supper is his biggest meal of the day and he was advised that if his fasting BG's are consistently over 140 mg/DL that he could go ahead and  Start it.  Plan: MEDICATIONS:  Stop Levemir  Metformin 500 mg with supper   EDUCATION / INSTRUCTIONS:  BG monitoring instructions: Patient is instructed to check his blood sugars 2 times a day.  Call [facility] if: BG persistently < 70 or > 300. . I reviewed the Rule of 15 for the treatment of hypoglycemia in detail with the patient. Literature supplied.   2. Dyslipidemia :    -Patient states that he is going to have a physical within the next few weeks and is under the impression his lipid panel will be checked then.  I have offered him a lipid check today. We again discussed the cardiovascular benefits of statins, patient declines at this time.   F/U in 4 months    Signed electronically by: Mack Guise, MD  [facility]  [facility] [address] [place], [place] [address] Phone: [phone] FAX: [phone]   CC: Cassandria Anger, MD [place] [address] Phone: [phone]  Fax:  [phone]  Return to Endocrinology clinic as below: No future appointments.

## 2019-05-16 ENCOUNTER — Other Ambulatory Visit: Payer: Self-pay

## 2019-05-16 ENCOUNTER — Ambulatory Visit (INDEPENDENT_AMBULATORY_CARE_PROVIDER_SITE_OTHER): Payer: Managed Care, Other (non HMO) | Admitting: Internal Medicine

## 2019-05-16 ENCOUNTER — Encounter: Payer: Self-pay | Admitting: Internal Medicine

## 2019-05-16 VITALS — BP 136/74 | HR 96 | Temp 98.1°F | Ht 73.0 in | Wt 216.0 lb

## 2019-05-16 DIAGNOSIS — E785 Hyperlipidemia, unspecified: Secondary | ICD-10-CM

## 2019-05-16 DIAGNOSIS — E1165 Type 2 diabetes mellitus with hyperglycemia: Secondary | ICD-10-CM

## 2019-05-16 DIAGNOSIS — K2270 Barrett's esophagus without dysplasia: Secondary | ICD-10-CM

## 2019-05-16 DIAGNOSIS — Z0000 Encounter for general adult medical examination without abnormal findings: Secondary | ICD-10-CM | POA: Diagnosis not present

## 2019-05-16 DIAGNOSIS — E559 Vitamin D deficiency, unspecified: Secondary | ICD-10-CM | POA: Diagnosis not present

## 2019-05-16 DIAGNOSIS — I10 Essential (primary) hypertension: Secondary | ICD-10-CM

## 2019-05-16 LAB — HEPATIC FUNCTION PANEL
ALT: 40 U/L (ref 0–53)
AST: 29 U/L (ref 0–37)
Albumin: 4.2 g/dL (ref 3.5–5.2)
Alkaline Phosphatase: 87 U/L (ref 39–117)
Bilirubin, Direct: 0.1 mg/dL (ref 0.0–0.3)
Total Bilirubin: 0.6 mg/dL (ref 0.2–1.2)
Total Protein: 7.3 g/dL (ref 6.0–8.3)

## 2019-05-16 LAB — BASIC METABOLIC PANEL
BUN: 22 mg/dL (ref 6–23)
CO2: 28 mEq/L (ref 19–32)
Calcium: 9 mg/dL (ref 8.4–10.5)
Chloride: 104 mEq/L (ref 96–112)
Creatinine, Ser: 1.15 mg/dL (ref 0.40–1.50)
GFR: 63.56 mL/min (ref >60.00)
Glucose, Bld: 118 mg/dL — ABNORMAL HIGH (ref 70–99)
Potassium: 4.2 mEq/L (ref 3.5–5.1)
Sodium: 138 mEq/L (ref 135–145)

## 2019-05-16 LAB — URINALYSIS
Bilirubin Urine: NEGATIVE
Hgb urine dipstick: NEGATIVE
Ketones, ur: NEGATIVE
Leukocytes,Ua: NEGATIVE
Nitrite: NEGATIVE
Specific Gravity, Urine: 1.02 (ref 1.000–1.030)
Total Protein, Urine: NEGATIVE
Urine Glucose: NEGATIVE
Urobilinogen, UA: 0.2 (ref 0.0–1.0)
pH: 5.5 (ref 5.0–8.0)

## 2019-05-16 LAB — TSH: TSH: 1.47 u[IU]/mL (ref 0.35–4.50)

## 2019-05-16 LAB — LIPID PANEL
Cholesterol: 186 mg/dL (ref 0–200)
HDL: 43.9 mg/dL (ref >39.00)
LDL Cholesterol: 123 mg/dL — ABNORMAL HIGH (ref 0–99)
NonHDL: 141.76
Total CHOL/HDL Ratio: 4
Triglycerides: 93 mg/dL (ref 0.0–149.0)
VLDL: 18.6 mg/dL (ref 0.0–40.0)

## 2019-05-16 LAB — PSA: PSA: 0.97 ng/mL (ref 0.10–4.00)

## 2019-05-16 LAB — VITAMIN D 25 HYDROXY (VIT D DEFICIENCY, FRACTURES): VITD: 63.35 ng/mL (ref 30.00–100.00)

## 2019-05-16 MED ORDER — TADALAFIL 5 MG PO TABS: ORAL_TABLET | ORAL | 3 refills | Status: DC

## 2019-05-16 MED ORDER — PANTOPRAZOLE SODIUM 40 MG PO TBEC: 40.0000 mg | DELAYED_RELEASE_TABLET | Freq: Every day | ORAL | 3 refills | Status: DC

## 2019-05-16 MED ORDER — AMLODIPINE-OLMESARTAN 5-40 MG PO TABS: ORAL_TABLET | ORAL | 3 refills | Status: DC

## 2019-05-16 NOTE — Assessment & Plan Note (Signed)
Azor 1/2 tab/d

## 2019-05-16 NOTE — Assessment & Plan Note (Signed)
Protonix.  ?

## 2019-05-16 NOTE — Progress Notes (Signed)
Subjective:  Patient ID: Corey [name], male    DOB: Jul 08, 1950  Age: 69 y.o. MRN: [id number]  CC: No chief complaint on file.   HPI Corey [name] presents for a well exam    Outpatient Medications Prior to Visit  Medication Sig Dispense Refill  . acetaminophen (TYLENOL) 650 MG CR tablet Take 650 mg by mouth every 8 (eight) hours as needed for pain.    Marland Kitchen amLODipine-olmesartan (AZOR) 5-40 MG tablet TAKE ONE-HALF (1/2) TABLET BY MOUTH DAILY 90 tablet 3  . Ascorbic Acid (VITAMIN C) 1000 MG tablet Take 1,000 mg by mouth 3 (three) times daily.    . cetirizine (ZYRTEC) 10 MG tablet Take 10 mg by mouth 2 (two) times daily. Takes twice daily    . cholecalciferol (VITAMIN D3) 25 MCG (1000 UT) tablet Take 5,000 Units by mouth daily.    . Magnesium 400 MG CAPS Take by mouth 3 (three) times daily.    Marland Kitchen OVER THE COUNTER MEDICATION K2MK7  200 mcg daily    . OVER THE COUNTER MEDICATION Sun Flower Lecithin 1000 mg BID    . pantoprazole (PROTONIX) 40 MG tablet Take 1 tablet (40 mg total) by mouth daily. 90 tablet 3  . saw palmetto 160 MG capsule Take 160 mg by mouth 2 (two) times daily.    . tadalafil (CIALIS) 5 MG tablet TAKE 1 TABLET BY MOUTH IN  THE MORNING -DO NOT TAKE IFYOU ARE CURRENTLY TAKING   NITRATES. 90 tablet 3  . tobramycin-dexamethasone (TOBRADEX) ophthalmic solution 1 drop 2 (two) times daily.    . Zinc 22.5 MG TABS Take by mouth 2 (two) times a week.    . Lancets (ONETOUCH DELICA PLUS Q000111Q) MISC Inject 1 Units into the skin 4 (four) times daily. (Patient not taking: Reported on 05/16/2019) 450 each 3  . metFORMIN (GLUCOPHAGE) 500 MG tablet Take 1 tablet (500 mg total) by mouth daily with supper. (Patient not taking: Reported on 05/16/2019) 30 tablet 6  . ONETOUCH VERIO test strip CHECK BLOOD SUGAR 4 TIMES A DAY (Patient not taking: Reported on 05/16/2019) 400 each 3   No facility-administered medications prior to visit.    ROS: Review of Systems  Constitutional: Negative  for appetite change, fatigue and unexpected weight change.  HENT: Negative for congestion, nosebleeds, sneezing, sore throat and trouble swallowing.   Eyes: Negative for itching and visual disturbance.  Respiratory: Negative for cough.   Cardiovascular: Negative for chest pain, palpitations and leg swelling.  Gastrointestinal: Negative for abdominal distention, blood in stool, diarrhea and nausea.  Genitourinary: Negative for frequency and hematuria.  Musculoskeletal: Negative for back pain, gait problem, joint swelling and neck pain.  Skin: Negative for rash.  Neurological: Negative for dizziness, tremors, speech difficulty and weakness.  Psychiatric/Behavioral: Negative for agitation, dysphoric mood, sleep disturbance and suicidal ideas. The patient is not nervous/anxious.     Objective:  BP 136/74 (BP Location: Left Arm, Patient Position: Sitting, Cuff Size: Normal)   Pulse 96   Temp 98.1 F (36.7 C) (Oral)   Ht 6\' 1"  (1.854 m)   Wt 216 lb (98 kg)   SpO2 98%   BMI 28.50 kg/m   BP Readings from Last 3 Encounters:  05/16/19 136/74  04/13/19 138/72  02/04/19 112/69    Wt Readings from Last 3 Encounters:  05/16/19 216 lb (98 kg)  04/13/19 216 lb 6.4 oz (98.2 kg)  02/04/19 219 lb (99.3 kg)    Physical Exam Constitutional:  General: He is not in acute distress.    Appearance: He is well-developed.     Comments: NAD  Eyes:     Conjunctiva/sclera: Conjunctivae normal.     Pupils: Pupils are equal, round, and reactive to light.  Neck:     Thyroid: No thyromegaly.     Vascular: No JVD.  Cardiovascular:     Rate and Rhythm: Normal rate and regular rhythm.     Heart sounds: Normal heart sounds. No murmur. No friction rub. No gallop.   Pulmonary:     Effort: Pulmonary effort is normal. No respiratory distress.     Breath sounds: Normal breath sounds. No wheezing or rales.  Chest:     Chest wall: No tenderness.  Abdominal:     General: Bowel sounds are normal. There  is no distension.     Palpations: Abdomen is soft. There is no mass.     Tenderness: There is no abdominal tenderness. There is no guarding or rebound.  Musculoskeletal:        General: No tenderness. Normal range of motion.     Cervical back: Normal range of motion.  Lymphadenopathy:     Cervical: No cervical adenopathy.  Skin:    General: Skin is warm and dry.     Findings: No rash.  Neurological:     Mental Status: He is alert and oriented to person, place, and time.     Cranial Nerves: No cranial nerve deficit.     Motor: No abnormal muscle tone.     Coordination: Coordination normal.     Gait: Gait normal.     Deep Tendon Reflexes: Reflexes are normal and symmetric.  Psychiatric:        Behavior: Behavior normal.        Thought Content: Thought content normal.        Judgment: Judgment normal.   rectal exam - NE, per Urology, GI  Lab Results  Component Value Date   WBC 6.3 05/17/2018   HGB 14.1 05/17/2018   HCT 40.9 05/17/2018   PLT 254.0 05/17/2018   GLUCOSE 144 (H) 05/17/2018   CHOL 238 (H) 03/28/2016   TRIG 194.0 (H) 03/28/2016   HDL 46.10 03/28/2016   LDLDIRECT 191.5 04/15/2013   LDLDIRECT 193.6 04/15/2013   LDLCALC 153 (H) 03/28/2016   ALT 62 04/25/2016   AST 47 (H) 04/25/2016   NA 135 05/17/2018   K 4.2 05/17/2018   CL 100 05/17/2018   CREATININE 1.22 05/17/2018   BUN 26 (H) 05/17/2018   CO2 25 05/17/2018   TSH 1.13 03/28/2016   PSA 0.98 03/28/2016   INR 0.95 04/25/2016   HGBA1C 6.0 (A) 04/13/2019   MICROALBUR <0.7 01/07/2019    No results found.  Assessment & Plan:   Walker Kehr, MD

## 2019-05-16 NOTE — Assessment & Plan Note (Signed)
Stopped insulin 3/21 - on diet, exercise Dr Kelton Pillar

## 2019-05-16 NOTE — Assessment & Plan Note (Signed)
Labs

## 2019-05-16 NOTE — Assessment & Plan Note (Addendum)
  We discussed age appropriate health related issues, including available/recomended screening tests and vaccinations. Labs were ordered to be later reviewed . All questions were answered. We discussed one or more of the following - seat belt use, use of sunscreen/sun exposure exercise, safe sex, fall risk reduction, second hand smoke exposure, firearm use and storage, seat belt use, a need for adhering to healthy diet and exercise. Labs were ordered . All questions were answered.  Colon 12/20

## 2019-05-16 NOTE — Patient Instructions (Signed)

## 2019-06-08 ENCOUNTER — Other Ambulatory Visit: Payer: Self-pay

## 2019-06-08 ENCOUNTER — Ambulatory Visit (INDEPENDENT_AMBULATORY_CARE_PROVIDER_SITE_OTHER)
Admission: RE | Admit: 2019-06-08 | Discharge: 2019-06-08 | Disposition: A | Discharge status: Home / Self Care | Payer: Self-pay | Source: Ambulatory Visit | Attending: Internal Medicine | Admitting: Internal Medicine

## 2019-06-08 DIAGNOSIS — E785 Hyperlipidemia, unspecified: Secondary | ICD-10-CM

## 2019-06-08 DIAGNOSIS — E1165 Type 2 diabetes mellitus with hyperglycemia: Secondary | ICD-10-CM

## 2019-06-09 ENCOUNTER — Other Ambulatory Visit: Payer: Self-pay | Admitting: Internal Medicine

## 2019-06-09 ENCOUNTER — Encounter: Payer: Self-pay | Admitting: Internal Medicine

## 2019-06-09 DIAGNOSIS — I2510 Atherosclerotic heart disease of native coronary artery without angina pectoris: Secondary | ICD-10-CM

## 2019-06-10 ENCOUNTER — Other Ambulatory Visit: Payer: Self-pay | Admitting: Internal Medicine

## 2019-06-16 ENCOUNTER — Other Ambulatory Visit: Payer: Self-pay

## 2019-06-16 ENCOUNTER — Ambulatory Visit: Payer: Managed Care, Other (non HMO) | Admitting: Cardiology

## 2019-06-16 ENCOUNTER — Encounter: Payer: Self-pay | Admitting: Cardiology

## 2019-06-16 VITALS — BP 130/76 | HR 77 | Temp 98.7°F | Ht 73.0 in | Wt 215.8 lb

## 2019-06-16 DIAGNOSIS — I2510 Atherosclerotic heart disease of native coronary artery without angina pectoris: Secondary | ICD-10-CM

## 2019-06-16 DIAGNOSIS — Z79899 Other long term (current) drug therapy: Secondary | ICD-10-CM | POA: Diagnosis not present

## 2019-06-16 DIAGNOSIS — I10 Essential (primary) hypertension: Secondary | ICD-10-CM | POA: Diagnosis not present

## 2019-06-16 DIAGNOSIS — E7800 Pure hypercholesterolemia, unspecified: Secondary | ICD-10-CM

## 2019-06-16 DIAGNOSIS — Z7189 Other specified counseling: Secondary | ICD-10-CM

## 2019-06-16 DIAGNOSIS — E119 Type 2 diabetes mellitus without complications: Secondary | ICD-10-CM

## 2019-06-16 DIAGNOSIS — Z712 Person consulting for explanation of examination or test findings: Secondary | ICD-10-CM

## 2019-06-16 MED ORDER — EZETIMIBE 10 MG PO TABS: 10.0000 mg | ORAL_TABLET | Freq: Every day | ORAL | 3 refills | Status: DC

## 2019-06-16 NOTE — Progress Notes (Signed)
Cardiology Office Note:    Date:  06/16/2019   ID:  Corey [name], DOB 1951/01/13, MRN [id number]  PCP:  Cassandria Anger, MD  Cardiologist:  Buford Dresser, MD  Referring MD: Cassandria Anger, MD   CC: new patient consultation for the evaluation of elevated calcium score  History of Present Illness:    Corey [name] is a 69 y.o. male with a hx of hypertension, type II diabetes, dyslipidemia, recurrent kidney stones who is seen as a new consult at the request of Plotnikov, Evie Lacks, MD for the evaluation and management of elevated calcium score.  Recently had calcium score performed 06/08/19, reviewed today. Noted calcium score of 1275. Reviewed Dr. Judeen [name] last note from 05/16/19. No symptoms, but given risk factors referred for calcium score for risk stratification.  Cardiovascular risk factors: Prior clinical ASCVD:  none Comorbid conditions: hypertension, hyperlipidemia, type II diabetes. Last GFR 63. Metabolic syndrome/Obesity: does have hypertension, diabetes, dyslipidemia but is not obese, BMI 28. Chronic inflammatory conditions: none Tobacco use history: never Family history: father died of MI at age 58. Mother had scarlet fever as a child, died of heart issues age 20. Uncle died of MI at age 29. Prior cardiac testing and/or incidental findings on other testing: coronary calcium as noted Exercise level: brisk walking, plus physical work around the house, up and down stairs (up to 40 flights/day) Current diet: cut carbs out almost completely. Very strict diet.  Is now off all diabetes medications. Has metformin PRN if his sugar elevates, has not required.  Cannot take aspirin due to severe bruising and bleeding. Tried at least two statins, had severe muscle pain with them and could not get out of bed. Has tolerated ezetimibe in the past. Last lipids were Tchol 186, TG 93, HD 43, LDL 123.  Very rare chest pain, momentary twinge of pain. At most 1-2  times/year, mild fleeting discomfort. Occurs with severe exertion. Pain goes from central chest to shoulder, like electrical strike. No associated symptoms.  Denies shortness of breath at rest or with normal exertion. No PND, orthopnea, LE edema or unexpected weight gain. No recent syncope (rare distant syncope with severe pain) or palpitations.  Past Medical History:  Diagnosis Date  . Allergic rhinitis   . Allergy   . Arthritis   . Asthma   . Barrett's esophagus   . Cataract    removed both eyes with lens replacement Dr Katy Fitch  . Complication of anesthesia    "took them 6 hours to wake me up"  . Diabetes mellitus without complication ([place])   . GERD (gastroesophageal reflux disease)   . Heart murmur   . History of kidney stones   . HTN (hypertension)   . Hyperlipidemia   . Nephrolithiasis   . Pneumonia    as a kid    Past Surgical History:  Procedure Laterality Date  . CATARACT EXTRACTION, BILATERAL     with lens implants   . COLONOSCOPY  01/15/2009   normal   . ESOPHAGOGASTRODUODENOSCOPY  03/20/2006  . EYE SURGERY    . KIDNEY STONE SURGERY     x 4 - most recent 04-2018 at [facility]   . TONSILLECTOMY    . TOTAL HIP ARTHROPLASTY Right 04/30/2016   Procedure: RIGHT TOTAL HIP ARTHROPLASTY ANTERIOR APPROACH;  Surgeon: Gaynelle Arabian, MD;  Location: [facility];  Service: Orthopedics;  Laterality: Right;  requests 178mins  . TRANSTHORACIC ECHOCARDIOGRAM  01/05/1998  . UPPER GASTROINTESTINAL ENDOSCOPY  Current Medications: Current Outpatient Medications on File Prior to Visit  Medication Sig  . acetaminophen (TYLENOL) 650 MG CR tablet Take 650 mg by mouth every 8 (eight) hours as needed for pain.  Marland Kitchen amLODipine-olmesartan (AZOR) 5-40 MG tablet TAKE ONE-HALF (1/2) TABLET BY MOUTH DAILY  . Ascorbic Acid (VITAMIN C) 1000 MG tablet Take 1,000 mg by mouth 3 (three) times daily.  . cetirizine (ZYRTEC) 10 MG tablet Take 10 mg by mouth 2 (two) times daily. Takes twice daily  .  cholecalciferol (VITAMIN D3) 25 MCG (1000 UT) tablet Take 5,000 Units by mouth daily.  . Lancets (ONETOUCH DELICA PLUS Q000111Q) MISC Inject 1 Units into the skin 4 (four) times daily. (Patient not taking: Reported on 05/16/2019)  . Magnesium 400 MG CAPS Take by mouth 3 (three) times daily.  . metFORMIN (GLUCOPHAGE) 500 MG tablet Take 1 tablet (500 mg total) by mouth daily with supper. (Patient not taking: Reported on 05/16/2019)  . ONETOUCH VERIO test strip USE TO CHECK BLOOD SUGAR 4 TIMES A DAY  . OVER THE COUNTER MEDICATION [id number]  200 mcg daily  . OVER THE COUNTER MEDICATION [place] Lecithin 1000 mg BID  . pantoprazole (PROTONIX) 40 MG tablet Take 1 tablet (40 mg total) by mouth daily.  . saw palmetto 160 MG capsule Take 160 mg by mouth 2 (two) times daily.  . tadalafil (CIALIS) 5 MG tablet TAKE 1 TABLET BY MOUTH IN  THE MORNING -DO NOT TAKE IFYOU ARE CURRENTLY TAKING   NITRATES.  Marland Kitchen tobramycin-dexamethasone (TOBRADEX) ophthalmic solution 1 drop 2 (two) times daily.  . Zinc 22.5 MG TABS Take by mouth 2 (two) times a week.   No current facility-administered medications on file prior to visit.     Allergies:   Albuterol, Aspirin, Ciprofloxacin, Clarithromycin, Colesevelam, Enalapril maleate, Esomeprazole magnesium, Niacin, Pravastatin sodium, Rosuvastatin, and Penicillins   Social History   Tobacco Use  . Smoking status: Never Smoker  . Smokeless tobacco: Never Used  Substance Use Topics  . Alcohol use: No  . Drug use: No    Family History: family history includes Colon polyps in his mother; Diabetes in his mother; Heart disease in his father and mother; Hypertension in an other family member. There is no history of Colon cancer, Esophageal cancer, Rectal cancer, or Stomach cancer.  ROS:   Please see the history of present illness.  Additional pertinent ROS: Constitutional: Negative for chills, fever, night sweats, unintentional weight loss  HENT: Negative for ear pain and hearing  loss.   Eyes: Negative for loss of vision and eye pain.  Respiratory: Negative for cough, sputum, wheezing.   Cardiovascular: See HPI. Gastrointestinal: Negative for abdominal pain, melena, and hematochezia.  Genitourinary: Negative for dysuria and hematuria.  Musculoskeletal: Negative for falls and myalgias.  Skin: Negative for itching and rash.  Neurological: Negative for focal weakness, focal sensory changes and loss of consciousness.  Endo/Heme/Allergies: Does not bruise/bleed easily.     EKGs/Labs/Other Studies Reviewed:    The following studies were reviewed today: Calcium score 06/08/19 Coronary calcium score of 1275. This was 73 percentile for age and sex matched control.  EKG:  EKG is personally reviewed.  The ekg ordered today demonstrates NSR, nonspecific ST pattern  Recent Labs: 05/16/2019: ALT 40; BUN 22; Creatinine, Ser 1.15; Potassium 4.2; Sodium 138; TSH 1.47  Recent Lipid Panel    Component Value Date/Time   CHOL 186 05/16/2019 0855   TRIG 93.0 05/16/2019 0855   HDL 43.90 05/16/2019 0855  CHOLHDL 4 05/16/2019 0855   VLDL 18.6 05/16/2019 0855   LDLCALC 123 (H) 05/16/2019 0855   LDLDIRECT 191.5 04/15/2013 0732   LDLDIRECT 193.6 04/15/2013 0732    Physical Exam:    VS:  BP 130/76   Pulse 77   Temp 98.7 F (37.1 C)   Ht 6\' 1"  (1.854 m)   Wt 215 lb 12.8 oz (97.9 kg)   SpO2 99%   BMI 28.47 kg/m     Wt Readings from Last 3 Encounters:  06/16/19 215 lb 12.8 oz (97.9 kg)  05/16/19 216 lb (98 kg)  04/13/19 216 lb 6.4 oz (98.2 kg)    GEN: Well nourished, well developed in no acute distress HEENT: Normal, moist mucous membranes NECK: No JVD CARDIAC: regular rhythm, normal S1 and S2, no rubs or gallops. No murmurs. VASCULAR: Radial and DP pulses 2+ bilaterally. No carotid bruits RESPIRATORY:  Clear to auscultation without rales, wheezing or rhonchi  ABDOMEN: Soft, non-tender, non-distended MUSCULOSKELETAL:  Ambulates independently SKIN: Warm and dry, no  edema NEUROLOGIC:  Alert and oriented x 3. No focal neuro deficits noted. PSYCHIATRIC:  Normal affect    ASSESSMENT:    1. Atherosclerosis of native coronary artery of native heart without angina pectoris   2. Hypercholesteremia   3. Medication management   4. Essential hypertension   5. Coronary artery calcification seen on CT scan   6. Encounter to discuss test results   7. Cardiac risk counseling   8. Counseling on health promotion and disease prevention   9. Type 2 diabetes mellitus without obesity (HCC)    PLAN:    Coronary artery calcification: very elevated score of 1275. This is consistent with a diagnosis of CAD. We reviewed the calcium score at length, including actual images as well as the graph showing mortality based on calcium score. We discussed the pathophysiology of cholesterol plaque formation, the role of calcium and why it is a marker, how plaque is key to acute MI/CVA, and how known plaque is managed with medications.  -discussed option for antiplatelet. He reports that he cannot take aspirin due to severe bruising/bleeding -discussed statin, see below -asymptomatic. Discussed options for stress testing (cannot treadmill alone due to diabetes), would be nuclear. Suspect CT coronary would not be conclusive given amount of calcium. As he is asymptomatic, he does not wish to proceed with additional testing at this time.  Hyperlipidemia: hypercholesterolemia, with LDL goal <70 given CAD -most recent LDL 123 -we discussed the data on statins, both in terms of their long term benefit as well as the risk of side effects. Reviewed common misconceptions about statins.  -he reports severe myalgia on 2 prior statins. I do not see statins in his medication history -we discussed trialing even a low intensity statin for benefit. He does not wish to pursue this -will trial ezetimibe first. If LDL not at goal on recheck in 3 mos, will refer to PharmD clinic.  Hypertension: goal  <130/80 -continue amlodipine-olmesartan  Type II diabetes, without obesity: -in the setting of CAD, discussed SGLT2i and GLP1RA. He will consider if he requires daily diabetes medications in the future.  -wishes to stay on metformin PRN at this time.  Cardiac risk counseling and prevention recommendations: -recommend heart healthy/Mediterranean diet, with whole grains, fruits, vegetable, fish, lean meats, nuts, and olive oil. Limit salt. -recommend moderate walking, 3-5 times/week for 30-50 minutes each session. Aim for at least 150 minutes.week. Goal should be pace of 3 miles/hours, or walking 1.5 miles  in 30 minutes -recommend avoidance of tobacco products. Avoid excess alcohol. -ASCVD risk score: his calcium burden is consistent with CAD, but even without, his ASCVD risk score is very elevated The 10-year ASCVD risk score Mikey Bussing DC Jr., et al., 2013) is: 31.7%   Values used to calculate the score:     Age: 71 years     Sex: Male     Is Non-Hispanic African American: No     Diabetic: Yes     Tobacco smoker: No     Systolic Blood Pressure: [id number] mmHg     Is BP treated: Yes     HDL Cholesterol: 43.9 mg/dL     Total Cholesterol: 186 mg/dL    Plan for follow up: 1 year or sooner as needed  Buford Dresser, MD, PhD [place]  [facility]    Medication Adjustments/Labs and Tests Ordered: Current medicines are reviewed at length with the patient today.  Concerns regarding medicines are outlined above.  Orders Placed This Encounter  Procedures  . Lipid panel  . EKG 12-Lead   Meds ordered this encounter  Medications  . ezetimibe (ZETIA) 10 MG tablet    Sig: Take 1 tablet (10 mg total) by mouth daily.    Dispense:  90 tablet    Refill:  3    Patient Instructions  Medication Instructions:  Start Zetia 10 mg daily  *If you need a refill on your cardiac medications before your next appointment, please call your pharmacy*   Lab Work: Your physician recommends that you  return for lab work in 3 months ( Fasting Lipids)  If you have labs (blood work) drawn today and your tests are completely normal, you will receive your results only by: Marland Kitchen MyChart Message (if you have MyChart) OR . A paper copy in the mail If you have any lab test that is abnormal or we need to change your treatment, we will call you to review the results.   Testing/Procedures: None   Follow-Up: At [facility], you and your health needs are our priority.  As part of our continuing mission to provide you with exceptional heart care, we have created designated Provider Care Teams.  These Care Teams include your primary Cardiologist (physician) and Advanced Practice Providers (APPs -  Physician Assistants and Nurse Practitioners) who all work together to provide you with the care you need, when you need it.  We recommend signing up for the patient portal called "MyChart".  Sign up information is provided on this After Visit Summary.  MyChart is used to connect with patients for Virtual Visits (Telemedicine).  Patients are able to view lab/test results, encounter notes, upcoming appointments, etc.  Non-urgent messages can be sent to your provider as well.   To learn more about what you can do with MyChart, go to [email or web address].    Your next appointment:   1 year(s)  The format for your next appointment:   In Person  Provider:   Buford Dresser, MD      Signed, Buford Dresser, MD PhD 06/16/2019  [place]

## 2019-06-16 NOTE — Patient Instructions (Signed)
Medication Instructions:  Start Zetia 10 mg daily  *If you need a refill on your cardiac medications before your next appointment, please call your pharmacy*   Lab Work: Your physician recommends that you return for lab work in 3 months ( Fasting Lipids)  If you have labs (blood work) drawn today and your tests are completely normal, you will receive your results only by: Marland Kitchen MyChart Message (if you have MyChart) OR . A paper copy in the mail If you have any lab test that is abnormal or we need to change your treatment, we will call you to review the results.   Testing/Procedures: None   Follow-Up: At [facility], you and your health needs are our priority.  As part of our continuing mission to provide you with exceptional heart care, we have created designated Provider Care Teams.  These Care Teams include your primary Cardiologist (physician) and Advanced Practice Providers (APPs -  Physician Assistants and Nurse Practitioners) who all work together to provide you with the care you need, when you need it.  We recommend signing up for the patient portal called "MyChart".  Sign up information is provided on this After Visit Summary.  MyChart is used to connect with patients for Virtual Visits (Telemedicine).  Patients are able to view lab/test results, encounter notes, upcoming appointments, etc.  Non-urgent messages can be sent to your provider as well.   To learn more about what you can do with MyChart, go to [email or web address].    Your next appointment:   1 year(s)  The format for your next appointment:   In Person  Provider:   Buford Dresser, MD

## 2019-06-17 DIAGNOSIS — E119 Type 2 diabetes mellitus without complications: Secondary | ICD-10-CM | POA: Insufficient documentation

## 2019-06-17 DIAGNOSIS — I2510 Atherosclerotic heart disease of native coronary artery without angina pectoris: Secondary | ICD-10-CM | POA: Insufficient documentation

## 2019-06-17 DIAGNOSIS — E7800 Pure hypercholesterolemia, unspecified: Secondary | ICD-10-CM | POA: Insufficient documentation

## 2019-06-30 ENCOUNTER — Other Ambulatory Visit: Payer: Self-pay | Admitting: Internal Medicine

## 2019-07-05 ENCOUNTER — Emergency Department (HOSPITAL_COMMUNITY): Payer: Managed Care, Other (non HMO)

## 2019-07-05 ENCOUNTER — Encounter (HOSPITAL_COMMUNITY): Payer: Self-pay | Admitting: Emergency Medicine

## 2019-07-05 ENCOUNTER — Telehealth: Payer: Self-pay | Admitting: Cardiology

## 2019-07-05 ENCOUNTER — Inpatient Hospital Stay (HOSPITAL_COMMUNITY)
Admission: EM | Disposition: A | Discharge status: Home / Self Care | Payer: Self-pay | Source: Home / Self Care | Attending: Thoracic Surgery (Cardiothoracic Vascular Surgery)

## 2019-07-05 ENCOUNTER — Inpatient Hospital Stay (HOSPITAL_COMMUNITY)
Admission: EM | Admit: 2019-07-05 | Discharge: 2019-07-11 | DRG: 234 | Disposition: A | Discharge status: Home / Self Care | Payer: Managed Care, Other (non HMO) | Attending: Thoracic Surgery (Cardiothoracic Vascular Surgery) | Admitting: Thoracic Surgery (Cardiothoracic Vascular Surgery)

## 2019-07-05 DIAGNOSIS — J309 Allergic rhinitis, unspecified: Secondary | ICD-10-CM | POA: Diagnosis present

## 2019-07-05 DIAGNOSIS — Z8249 Family history of ischemic heart disease and other diseases of the circulatory system: Secondary | ICD-10-CM | POA: Diagnosis not present

## 2019-07-05 DIAGNOSIS — Z833 Family history of diabetes mellitus: Secondary | ICD-10-CM

## 2019-07-05 DIAGNOSIS — E785 Hyperlipidemia, unspecified: Secondary | ICD-10-CM | POA: Diagnosis present

## 2019-07-05 DIAGNOSIS — Z881 Allergy status to other antibiotic agents status: Secondary | ICD-10-CM | POA: Diagnosis not present

## 2019-07-05 DIAGNOSIS — J9811 Atelectasis: Secondary | ICD-10-CM

## 2019-07-05 DIAGNOSIS — N400 Enlarged prostate without lower urinary tract symptoms: Secondary | ICD-10-CM | POA: Diagnosis present

## 2019-07-05 DIAGNOSIS — I10 Essential (primary) hypertension: Secondary | ICD-10-CM | POA: Diagnosis present

## 2019-07-05 DIAGNOSIS — Z880 Allergy status to penicillin: Secondary | ICD-10-CM

## 2019-07-05 DIAGNOSIS — Z888 Allergy status to other drugs, medicaments and biological substances status: Secondary | ICD-10-CM

## 2019-07-05 DIAGNOSIS — K2270 Barrett's esophagus without dysplasia: Secondary | ICD-10-CM | POA: Diagnosis present

## 2019-07-05 DIAGNOSIS — Z96641 Presence of right artificial hip joint: Secondary | ICD-10-CM | POA: Diagnosis present

## 2019-07-05 DIAGNOSIS — I25110 Atherosclerotic heart disease of native coronary artery with unstable angina pectoris: Secondary | ICD-10-CM | POA: Diagnosis not present

## 2019-07-05 DIAGNOSIS — Z951 Presence of aortocoronary bypass graft: Secondary | ICD-10-CM

## 2019-07-05 DIAGNOSIS — R000 Tachycardia, unspecified: Secondary | ICD-10-CM | POA: Diagnosis not present

## 2019-07-05 DIAGNOSIS — I214 Non-ST elevation (NSTEMI) myocardial infarction: Secondary | ICD-10-CM | POA: Diagnosis present

## 2019-07-05 DIAGNOSIS — E1151 Type 2 diabetes mellitus with diabetic peripheral angiopathy without gangrene: Secondary | ICD-10-CM | POA: Diagnosis present

## 2019-07-05 DIAGNOSIS — E8770 Fluid overload, unspecified: Secondary | ICD-10-CM | POA: Diagnosis not present

## 2019-07-05 DIAGNOSIS — Z886 Allergy status to analgesic agent status: Secondary | ICD-10-CM | POA: Diagnosis not present

## 2019-07-05 DIAGNOSIS — D62 Acute posthemorrhagic anemia: Secondary | ICD-10-CM | POA: Diagnosis not present

## 2019-07-05 DIAGNOSIS — I2510 Atherosclerotic heart disease of native coronary artery without angina pectoris: Secondary | ICD-10-CM | POA: Diagnosis present

## 2019-07-05 DIAGNOSIS — E7800 Pure hypercholesterolemia, unspecified: Secondary | ICD-10-CM | POA: Diagnosis present

## 2019-07-05 DIAGNOSIS — Z20822 Contact with and (suspected) exposure to covid-19: Secondary | ICD-10-CM | POA: Diagnosis present

## 2019-07-05 DIAGNOSIS — R079 Chest pain, unspecified: Secondary | ICD-10-CM | POA: Diagnosis not present

## 2019-07-05 DIAGNOSIS — K219 Gastro-esophageal reflux disease without esophagitis: Secondary | ICD-10-CM | POA: Diagnosis present

## 2019-07-05 DIAGNOSIS — Z7984 Long term (current) use of oral hypoglycemic drugs: Secondary | ICD-10-CM | POA: Diagnosis not present

## 2019-07-05 DIAGNOSIS — Z01810 Encounter for preprocedural cardiovascular examination: Secondary | ICD-10-CM | POA: Diagnosis not present

## 2019-07-05 DIAGNOSIS — I2582 Chronic total occlusion of coronary artery: Secondary | ICD-10-CM | POA: Diagnosis present

## 2019-07-05 DIAGNOSIS — Z79899 Other long term (current) drug therapy: Secondary | ICD-10-CM

## 2019-07-05 HISTORY — PX: LEFT HEART CATH AND CORONARY ANGIOGRAPHY: CATH118249

## 2019-07-05 LAB — BASIC METABOLIC PANEL
Anion gap: 12 (ref 5–15)
BUN: 21 mg/dL (ref 8–23)
CO2: 21 mmol/L — ABNORMAL LOW (ref 22–32)
Calcium: 9.5 mg/dL (ref 8.9–10.3)
Chloride: 106 mmol/L (ref 98–111)
Creatinine, Ser: 1.27 mg/dL — ABNORMAL HIGH (ref 0.61–1.24)
GFR calc Af Amer: >60 mL/min (ref >60)
GFR calc non Af Amer: 58 mL/min — ABNORMAL LOW (ref >60)
Glucose, Bld: 124 mg/dL — ABNORMAL HIGH (ref 70–99)
Potassium: 3.9 mmol/L (ref 3.5–5.1)
Sodium: 139 mmol/L (ref 135–145)

## 2019-07-05 LAB — TROPONIN I (HIGH SENSITIVITY)
Troponin I (High Sensitivity): 239 ng/L (ref <18)
Troponin I (High Sensitivity): 555 ng/L (ref <18)

## 2019-07-05 LAB — CBC
HCT: 45.4 % (ref 39.0–52.0)
Hemoglobin: 14.8 g/dL (ref 13.0–17.0)
MCH: 28.7 pg (ref 26.0–34.0)
MCHC: 32.6 g/dL (ref 30.0–36.0)
MCV: 88 fL (ref 80.0–100.0)
Platelets: 246 10*3/uL (ref 150–400)
RBC: 5.16 MIL/uL (ref 4.22–5.81)
RDW: 13.4 % (ref 11.5–15.5)
WBC: 7.8 10*3/uL (ref 4.0–10.5)
nRBC: 0 % (ref 0.0–0.2)

## 2019-07-05 LAB — SARS CORONAVIRUS 2 BY RT PCR (HOSPITAL ORDER, PERFORMED IN ~~LOC~~ HOSPITAL LAB): SARS Coronavirus 2: NEGATIVE

## 2019-07-05 LAB — GLUCOSE, CAPILLARY
Glucose-Capillary: 93 mg/dL (ref 70–99)
Glucose-Capillary: 190 mg/dL — ABNORMAL HIGH (ref 70–99)

## 2019-07-05 SURGERY — LEFT HEART CATH AND CORONARY ANGIOGRAPHY
Anesthesia: LOCAL

## 2019-07-05 MED ORDER — SODIUM CHLORIDE 0.9% FLUSH
3.0000 mL | Freq: Once | INTRAVENOUS | Status: AC
  Administered 2019-07-06: 3 mL via INTRAVENOUS

## 2019-07-05 MED ORDER — ASPIRIN 81 MG PO CHEW
324.0000 mg | CHEWABLE_TABLET | Freq: Once | ORAL | Status: AC
  Administered 2019-07-05: 324 mg via ORAL
  Filled 2019-07-05: qty 4

## 2019-07-05 MED ORDER — SODIUM CHLORIDE 0.9% FLUSH: 3.0000 mL | INTRAVENOUS | Status: DC | PRN

## 2019-07-05 MED ORDER — METOPROLOL TARTRATE 12.5 MG HALF TABLET
12.5000 mg | ORAL_TABLET | Freq: Two times a day (BID) | ORAL | Status: DC
  Administered 2019-07-05 – 2019-07-06 (×3): 12.5 mg via ORAL
  Filled 2019-07-05 (×3): qty 1

## 2019-07-05 MED ORDER — HEPARIN SODIUM (PORCINE) 5000 UNIT/ML IJ SOLN
INTRAMUSCULAR | Status: AC
  Administered 2019-07-05: 4000 [IU]
  Filled 2019-07-05: qty 1

## 2019-07-05 MED ORDER — ASPIRIN 300 MG RE SUPP: 300.0000 mg | RECTAL | Status: AC

## 2019-07-05 MED ORDER — SODIUM CHLORIDE 0.9% FLUSH: 3.0000 mL | Freq: Two times a day (BID) | INTRAVENOUS | Status: DC

## 2019-07-05 MED ORDER — HEPARIN SODIUM (PORCINE) 1000 UNIT/ML IJ SOLN
INTRAMUSCULAR | Status: AC
  Filled 2019-07-05: qty 1

## 2019-07-05 MED ORDER — ACETAMINOPHEN 325 MG PO TABS: 650.0000 mg | ORAL_TABLET | ORAL | Status: DC | PRN

## 2019-07-05 MED ORDER — FENTANYL CITRATE (PF) 100 MCG/2ML IJ SOLN
INTRAMUSCULAR | Status: DC | PRN
  Administered 2019-07-05: 25 ug via INTRAVENOUS

## 2019-07-05 MED ORDER — HEPARIN SODIUM (PORCINE) 1000 UNIT/ML IJ SOLN
INTRAMUSCULAR | Status: DC | PRN
  Administered 2019-07-05: 4500 [IU] via INTRAVENOUS

## 2019-07-05 MED ORDER — VERAPAMIL HCL 2.5 MG/ML IV SOLN
INTRAVENOUS | Status: AC
  Filled 2019-07-05: qty 2

## 2019-07-05 MED ORDER — ASPIRIN 81 MG PO CHEW: 324.0000 mg | CHEWABLE_TABLET | ORAL | Status: AC

## 2019-07-05 MED ORDER — SODIUM CHLORIDE 0.9 % IV SOLN: 250.0000 mL | INTRAVENOUS | Status: DC | PRN

## 2019-07-05 MED ORDER — ZOLPIDEM TARTRATE 5 MG PO TABS: 5.0000 mg | ORAL_TABLET | Freq: Every evening | ORAL | Status: DC | PRN

## 2019-07-05 MED ORDER — HEPARIN (PORCINE) 25000 UT/250ML-% IV SOLN
1400.0000 [IU]/h | INTRAVENOUS | Status: AC
  Administered 2019-07-06: 1400 [IU]/h via INTRAVENOUS
  Administered 2019-07-06: 1200 [IU]/h via INTRAVENOUS
  Filled 2019-07-05 (×2): qty 250

## 2019-07-05 MED ORDER — MIDAZOLAM HCL 2 MG/2ML IJ SOLN
INTRAMUSCULAR | Status: AC
  Filled 2019-07-05: qty 2

## 2019-07-05 MED ORDER — ALPRAZOLAM 0.25 MG PO TABS: 0.2500 mg | ORAL_TABLET | Freq: Two times a day (BID) | ORAL | Status: DC | PRN

## 2019-07-05 MED ORDER — IOHEXOL 350 MG/ML SOLN
INTRAVENOUS | Status: DC | PRN
  Administered 2019-07-05: 50 mL

## 2019-07-05 MED ORDER — ASPIRIN EC 81 MG PO TBEC
81.0000 mg | DELAYED_RELEASE_TABLET | Freq: Every day | ORAL | Status: DC
  Administered 2019-07-06: 10:00:00 81 mg via ORAL
  Filled 2019-07-05: qty 1

## 2019-07-05 MED ORDER — HEPARIN (PORCINE) IN NACL 1000-0.9 UT/500ML-% IV SOLN
INTRAVENOUS | Status: DC | PRN
  Administered 2019-07-05 (×2): 500 mL

## 2019-07-05 MED ORDER — NITROGLYCERIN 0.4 MG SL SUBL: 0.4000 mg | SUBLINGUAL_TABLET | SUBLINGUAL | Status: DC | PRN

## 2019-07-05 MED ORDER — MIDAZOLAM HCL 2 MG/2ML IJ SOLN
INTRAMUSCULAR | Status: DC | PRN
  Administered 2019-07-05: 2 mg via INTRAVENOUS

## 2019-07-05 MED ORDER — NITROGLYCERIN 0.4 MG SL SUBL
0.4000 mg | SUBLINGUAL_TABLET | SUBLINGUAL | Status: DC | PRN
  Administered 2019-07-05: 15:00:00 0.4 mg via SUBLINGUAL
  Filled 2019-07-05: qty 1

## 2019-07-05 MED ORDER — EZETIMIBE 10 MG PO TABS
10.0000 mg | ORAL_TABLET | Freq: Every day | ORAL | Status: DC
  Administered 2019-07-06: 10 mg via ORAL
  Filled 2019-07-05: qty 1

## 2019-07-05 MED ORDER — HEPARIN SODIUM (PORCINE) 5000 UNIT/ML IJ SOLN: 4000.0000 [IU] | Freq: Once | INTRAMUSCULAR | Status: DC

## 2019-07-05 MED ORDER — LIDOCAINE HCL (PF) 1 % IJ SOLN
INTRAMUSCULAR | Status: DC | PRN
  Administered 2019-07-05: 2 mL

## 2019-07-05 MED ORDER — ONDANSETRON HCL 4 MG/2ML IJ SOLN: 4.0000 mg | Freq: Four times a day (QID) | INTRAMUSCULAR | Status: DC | PRN

## 2019-07-05 MED ORDER — LIDOCAINE HCL (PF) 1 % IJ SOLN
INTRAMUSCULAR | Status: AC
  Filled 2019-07-05: qty 30

## 2019-07-05 MED ORDER — SODIUM CHLORIDE 0.9 % WEIGHT BASED INFUSION: 1.0000 mL/kg/h | INTRAVENOUS | Status: AC

## 2019-07-05 MED ORDER — FENTANYL CITRATE (PF) 100 MCG/2ML IJ SOLN
INTRAMUSCULAR | Status: AC
  Filled 2019-07-05: qty 2

## 2019-07-05 MED ORDER — PANTOPRAZOLE SODIUM 40 MG PO TBEC
40.0000 mg | DELAYED_RELEASE_TABLET | Freq: Every day | ORAL | Status: DC
  Administered 2019-07-05 – 2019-07-11 (×6): 40 mg via ORAL
  Filled 2019-07-05 (×6): qty 1

## 2019-07-05 MED ORDER — SODIUM CHLORIDE 0.9 % IV SOLN
INTRAVENOUS | Status: AC | PRN
  Administered 2019-07-05: 93 mL/h via INTRAVENOUS

## 2019-07-05 MED ORDER — VERAPAMIL HCL 2.5 MG/ML IV SOLN
INTRAVENOUS | Status: DC | PRN
  Administered 2019-07-05: 10 mL via INTRA_ARTERIAL

## 2019-07-05 MED ORDER — HEPARIN (PORCINE) IN NACL 1000-0.9 UT/500ML-% IV SOLN
INTRAVENOUS | Status: AC
  Filled 2019-07-05: qty 1000

## 2019-07-05 MED ORDER — HEPARIN (PORCINE) 25000 UT/250ML-% IV SOLN
1200.0000 [IU]/h | INTRAVENOUS | Status: DC
  Administered 2019-07-05: 16:00:00 1200 [IU]/h via INTRAVENOUS
  Filled 2019-07-05: qty 250

## 2019-07-05 SURGICAL SUPPLY — 11 items
CATH 5FR JL3.5 JR4 ANG PIG MP (CATHETERS) ×2 IMPLANT
DEVICE RAD COMP TR BAND LRG (VASCULAR PRODUCTS) ×2 IMPLANT
ELECT DEFIB PAD ADLT CADENCE (PAD) ×2 IMPLANT
GLIDESHEATH SLEND SS 6F .021 (SHEATH) ×2 IMPLANT
GUIDEWIRE INQWIRE 1.5J.035X260 (WIRE) ×1 IMPLANT
INQWIRE 1.5J .035X260CM (WIRE) ×2
KIT ENCORE 26 ADVANTAGE (KITS) IMPLANT
KIT HEART LEFT (KITS) ×2 IMPLANT
PACK CARDIAC CATHETERIZATION (CUSTOM PROCEDURE TRAY) ×2 IMPLANT
TRANSDUCER W/STOPCOCK (MISCELLANEOUS) ×2 IMPLANT
TUBING CIL FLEX 10 FLL-RA (TUBING) ×2 IMPLANT

## 2019-07-05 NOTE — Interval H&P Note (Signed)
History and Physical Interval Note:  07/05/2019 4:30 PM  Corey [name]  has presented today for surgery, with the diagnosis of NSTEMI.  The various methods of treatment have been discussed with the patient and family. After consideration of risks, benefits and other options for treatment, the patient has consented to  Procedure(s): LEFT HEART CATH AND CORONARY ANGIOGRAPHY (N/A) as a surgical intervention.  The patient's history has been reviewed, patient examined, no change in status, stable for surgery.  I have reviewed the patient's chart and labs.  Questions were answered to the patient's satisfaction.   Cath Lab Visit (complete for each Cath Lab visit)  Clinical Evaluation Leading to the Procedure:   ACS: Yes.    Non-ACS:    Anginal Classification: CCS IV  Anti-ischemic medical therapy: Minimal Therapy (1 class of medications)  Non-Invasive Test Results: No non-invasive testing performed  Prior CABG: No previous CABG        Corey [name] [facility] 07/05/2019 4:30 PM

## 2019-07-05 NOTE — ED Triage Notes (Signed)
Pt states at 0400 he was waken up with a feeling of "something sitting on his chest" pt broke out into a sweat and felt pain in left arm and into jaw. Pt states pain has been coming and going but last hour seems worse.

## 2019-07-05 NOTE — Progress Notes (Signed)
ANTICOAGULATION CONSULT NOTE - Initial Consult  Pharmacy Consult for heparin Indication: chest pain/ACS  Allergies  Allergen Reactions  . Albuterol     REACTION: "feels like drowning"  . Aspirin Other (See Comments)    Causes bruising  . Ciprofloxacin     REACTION: rash  . Clarithromycin     REACTION: swelling  . Colesevelam     REACTION: constip  . Enalapril Maleate     REACTION: Erectile dysfunction  . Esomeprazole Magnesium     REACTION: epigastric pain  . Niacin     REACTION: diarrhea  . Pravastatin Sodium     REACTION: aching, listless  . Rosuvastatin     REACTION: achy  . Penicillins Hives    Has patient had a PCN reaction causing immediate rash, facial/tongue/throat swelling, SOB or lightheadedness with hypotension: Yes Has patient had a PCN reaction causing severe rash involving mucus membranes or skin necrosis: Yes Has patient had a PCN reaction that required hospitalization No Has patient had a PCN reaction occurring within the last 10 years: No If all of the above answers are "NO", then may proceed with Cephalosporin use.     Patient Measurements: Height: 6\' 1"  (185.4 cm) Weight: 93 kg (205 lb) IBW/kg (Calculated) : 79.9 Heparin Dosing Weight: 93 kg   Vital Signs: Temp: 97.9 F (36.6 C) (05/11 1402) Temp Source: Oral (05/11 1402) BP: 141/84 (05/11 1402) Pulse Rate: 77 (05/11 1402)  Labs: Recent Labs    07/05/19 1222  HGB 14.8  HCT 45.4  PLT 246  CREATININE 1.27*  TROPONINIHS 239*    Estimated Creatinine Clearance: 64.7 mL/min (A) (by C-G formula based on SCr of 1.27 mg/dL (H)).   Medical History: Past Medical History:  Diagnosis Date  . Allergic rhinitis   . Allergy   . Arthritis   . Asthma   . Barrett's esophagus   . Cataract    removed both eyes with lens replacement Dr Katy Fitch  . Complication of anesthesia    "took them 6 hours to wake me up"  . Diabetes mellitus without complication ([place])   . GERD (gastroesophageal reflux  disease)   . Heart murmur   . History of kidney stones   . HTN (hypertension)   . Hyperlipidemia   . Nephrolithiasis   . Pneumonia    as a kid    Medications:  (Not in a hospital admission)   Assessment: 48 YOM who presents with chest pain found to have elevated troponins. Pharmacy consulted to start IV heparin for ACS. H/H and Plt wnl. SCr 1.27  Goal of Therapy:  Heparin level 0.3-0.7 units/ml Monitor platelets by anticoagulation protocol: Yes   Plan:  -Heparin 4000 units IV bolus then start IV heparin infusion at 1200 units/hr -F/u 6 hr HL -Monitor daily HL, CBC and s/s of bleeding   Albertina Parr, PharmD., BCPS, BCCCP Clinical Pharmacist Clinical phone for 07/05/19 until 11:30pm: [phone] If after 11:30pm, please refer to [facility] for unit-specific pharmacist

## 2019-07-05 NOTE — Progress Notes (Signed)
[place] for heparin Indication: chest pain/ACS  Allergies  Allergen Reactions  . Albuterol     REACTION: "feels like drowning"  . Aspirin Other (See Comments)    Causes bruising  . Ciprofloxacin     REACTION: rash  . Clarithromycin     REACTION: swelling  . Colesevelam     REACTION: constip  . Enalapril Maleate     REACTION: Erectile dysfunction  . Esomeprazole Magnesium     REACTION: epigastric pain  . Niacin     REACTION: diarrhea  . Pravastatin Sodium     REACTION: aching, listless  . Rosuvastatin     REACTION: achy  . Penicillins Hives    Has patient had a PCN reaction causing immediate rash, facial/tongue/throat swelling, SOB or lightheadedness with hypotension: Yes Has patient had a PCN reaction causing severe rash involving mucus membranes or skin necrosis: Yes Has patient had a PCN reaction that required hospitalization No Has patient had a PCN reaction occurring within the last 10 years: No If all of the above answers are "NO", then may proceed with Cephalosporin use.     Patient Measurements: Height: 6\' 1"  (185.4 cm) Weight: 94.4 kg (208 lb 1.6 oz) IBW/kg (Calculated) : 79.9 Heparin Dosing Weight: 93 kg   Vital Signs: Temp: 98.5 F (36.9 C) (05/11 1846) Temp Source: Oral (05/11 1846) BP: 135/80 (05/11 1846) Pulse Rate: 92 (05/11 1846)  Labs: Recent Labs    07/05/19 1222 07/05/19 1426  HGB 14.8  --   HCT 45.4  --   PLT 246  --   CREATININE 1.27*  --   TROPONINIHS 239* 555*    Estimated Creatinine Clearance: 64.7 mL/min (A) (by C-G formula based on SCr of 1.27 mg/dL (H)).   Assessment: 53 YOM who presents with chest pain found to have elevated troponins and started on IV heparin.  Patient went to cath, which found severe 2vdz requiring CVTS evaluation for CABG.  Pharmacy consulted to resume IV heparin 8 hours post sheath removal.  Sheath removed at 1706 per procedural log.  No bleeding/hematoma per  RN.  Goal of Therapy:  Heparin level 0.3-0.7 units/ml Monitor platelets by anticoagulation protocol: Yes   Plan:  On 5/12 at 0100, restart heparin infusion at 1200 units/hr, no bolus post cath Check 6 hr heparin level Daily heparin level and CBC  [place] Sara D. Mina Marble, PharmD, BCPS, [place] 07/05/2019, 6:51 PM

## 2019-07-05 NOTE — Telephone Encounter (Signed)
Pt c/o of Chest Pain: STAT if CP now or developed within 24 hours  1. Are you having CP right now? no  2. Are you experiencing any other symptoms (ex. SOB, nausea, vomiting, sweating)? No, denies these symptoms. Says there is pain where his lungs are. Feels like there is inflammation in his heart BP 150/90 HR 100  3. How long have you been experiencing CP? Worse over the last two days  4. Is your CP continuous or coming and going? Coming and going - when he exerts himself. Stops when he relaxes.   5. Have you taken Nitroglycerin? no ?

## 2019-07-05 NOTE — Telephone Encounter (Signed)
Spoke with patient. Patient reports he had chest pain 10 minutes ago which has since resolved with rest. Patient reports the pains have been coming and going for the last two days. He reports the pains are new to him in that they feel nothing like pains he has had in the past. The pain comes on with exertion and is in the middle of his chest. The pain radiates into his lungs and he reports it feels like his heart has something swelled around it when the pain occurs. He reports if he sits down and leans forward the pain will resolve. Patient is hypertensive today at 150/90 with a HR of 100. Patient has anxiety related to the chest pain but no other symptoms. Patient does not have nitroglycerin available at home.   Patient advised to report to the nearest emergency room as he made the statement that the chest pain is still in the background.   Patient placed on Dr. Judeth Cornfield schedule for tomorrow afternoon to follow up after his hospital visit if he is discharged.

## 2019-07-05 NOTE — H&P (Addendum)
Cardiology Admission History and Physical:   Patient ID: Corey [name] MRN: [id number]; DOB: [birth year]   Admission date: 07/05/2019  Primary Care Provider: Cassandria Anger, MD Primary Cardiologist: Buford Dresser, MD  Primary Electrophysiologist:  None   Chief Complaint:  Chest pain  Patient Profile:   Corey [name] is a 69 y.o. male with pmh of HTN, DM2, dyslipidemia, BPY, GERD, Barrett's esophagus, recurrent kidney stones, recent calcium score of 1275 consistent with CAD who is being seen for chest pain.   History of Present Illness:   [name] was recently referred to Dr. Harrell Gave for elevated calcium score of 1275, which was investigated due to cardiac family history. Patient has not been symptomatic. He has history of easy bleeding/bruising with aspirin so he does not take it. Has tried multiple statins but they cause severe muscle pain. He has diet controlled diabetes. No tobacco, drug, alcohol use. Family history positive for MI in father age 12, heart failure in his mother, uncle with MI at age 30. At the appointment Dr. Harrell Gave discussed additional testing was discussed but the patient declined since the patient was asymptomatic.   The patient presented to the ED 07/05/19 for chest pain. Pain started this morning at 4AM. It was sudden and pressure-like in nature. Located in the left side of the chest and radiated into the left arm. Had associated sob, diaphoresis. The pain waxed and waned for the next couple hours. It was worse going up the stairs. When the pain persisted he went into the ED. In the waiting room he had severe chest pain and was sweating profusely with nausea and sob. He was given SL nuito x 1 and pain improved.  Denies recent fever, chills, cough, abd pain, bleeding issues.   Int he ED BP 141/84, pulse 77, afebrile, RR 17, 100% O2. Labs showed potassium 3.0, glucose 124, creatinine 1.27, WBC 7.8, Hgb 14.8. HS troponin 239. COVID pending. CXR  unremarkable. EKG shows sinus bradycardia, 54 bpm with TWI lead III. He was given Aspirin 324 chewable and started on IV heparin.     Past Medical History:  Diagnosis Date  . Allergic rhinitis   . Allergy   . Arthritis   . Asthma   . Barrett's esophagus   . Cataract    removed both eyes with lens replacement Dr Katy Fitch  . Complication of anesthesia    "took them 6 hours to wake me up"  . Diabetes mellitus without complication ([place])   . GERD (gastroesophageal reflux disease)   . Heart murmur   . History of kidney stones   . HTN (hypertension)   . Hyperlipidemia   . Nephrolithiasis   . Pneumonia    as a kid    Past Surgical History:  Procedure Laterality Date  . CATARACT EXTRACTION, BILATERAL     with lens implants   . COLONOSCOPY  01/15/2009   normal   . ESOPHAGOGASTRODUODENOSCOPY  03/20/2006  . EYE SURGERY    . KIDNEY STONE SURGERY     x 4 - most recent 04-2018 at [facility]   . TONSILLECTOMY    . TOTAL HIP ARTHROPLASTY Right 04/30/2016   Procedure: RIGHT TOTAL HIP ARTHROPLASTY ANTERIOR APPROACH;  Surgeon: Gaynelle Arabian, MD;  Location: [facility];  Service: Orthopedics;  Laterality: Right;  requests 178mins  . TRANSTHORACIC ECHOCARDIOGRAM  01/05/1998  . UPPER GASTROINTESTINAL ENDOSCOPY       Medications Prior to Admission: Prior to Admission medications   Medication Sig Start Date  End Date Taking? Authorizing Provider  acetaminophen (TYLENOL) 650 MG CR tablet Take 650 mg by mouth every 8 (eight) hours as needed for pain.   Yes [provider]  amLODipine-olmesartan (AZOR) 5-40 MG tablet TAKE ONE-HALF (1/2) TABLET BY MOUTH DAILY Patient taking differently: Take 0.5 tablets by mouth daily. TAKE ONE-HALF (1/2) TABLET BY MOUTH DAILY 05/16/19  Yes Plotnikov, Evie Lacks, MD  Ascorbic Acid (VITAMIN C) 1000 MG tablet Take 1,000 mg by mouth 3 (three) times daily.   Yes [provider]  cetirizine (ZYRTEC) 10 MG tablet Take 10 mg by mouth 2 (two) times daily.  Takes twice daily   Yes [provider]  cholecalciferol (VITAMIN D3) 25 MCG (1000 UT) tablet Take 5,000 Units by mouth daily.   Yes [provider]  ezetimibe (ZETIA) 10 MG tablet Take 1 tablet (10 mg total) by mouth daily. 06/16/19 09/14/19 Yes Buford Dresser, MD  Magnesium 400 MG CAPS Take by mouth 3 (three) times daily.   Yes [provider]  metFORMIN (GLUCOPHAGE) 500 MG tablet Take 1 tablet (500 mg total) by mouth daily with supper. 04/13/19  Yes Shamleffer, Melanie Crazier, MD  OVER THE COUNTER MEDICATION [id number]  200 mcg daily   Yes [provider]  OVER THE COUNTER MEDICATION [place] Lecithin 1000 mg BID   Yes [provider]  pantoprazole (PROTONIX) 40 MG tablet Take 1 tablet (40 mg total) by mouth daily. Patient taking differently: Take 20 mg by mouth daily.  05/16/19  Yes Plotnikov, Evie Lacks, MD  saw palmetto 160 MG capsule Take 160 mg by mouth 2 (two) times daily.   Yes [provider]  tadalafil (CIALIS) 5 MG tablet TAKE 1 TABLET BY MOUTH IN  THE MORNING -DO NOT TAKE IFYOU ARE CURRENTLY TAKING   NITRATES. Patient taking differently: Take 5 mg by mouth daily. TAKE 1 TABLET BY MOUTH IN  THE MORNING -DO NOT TAKE IFYOU ARE CURRENTLY TAKING   NITRATES. 05/16/19  Yes Plotnikov, Evie Lacks, MD  Zinc 22.5 MG TABS Take by mouth 2 (two) times a week.   Yes [provider]  Lancets (ONETOUCH DELICA PLUS Q000111Q) MISC Inject 1 Units into the skin 4 (four) times daily. 05/17/18   Plotnikov, Evie Lacks, MD  ONETOUCH VERIO test strip USE TO CHECK BLOOD SUGAR 4 TIMES A DAY 06/10/19   Plotnikov, Evie Lacks, MD     Allergies:    Allergies  Allergen Reactions  . Albuterol     REACTION: "feels like drowning"  . Aspirin Other (See Comments)    Causes bruising  . Ciprofloxacin     REACTION: rash  . Clarithromycin     REACTION: swelling  . Colesevelam     REACTION: constip  . Enalapril Maleate     REACTION: Erectile dysfunction    . Esomeprazole Magnesium     REACTION: epigastric pain  . Niacin     REACTION: diarrhea  . Pravastatin Sodium     REACTION: aching, listless  . Rosuvastatin     REACTION: achy  . Penicillins Hives    Has patient had a PCN reaction causing immediate rash, facial/tongue/throat swelling, SOB or lightheadedness with hypotension: Yes Has patient had a PCN reaction causing severe rash involving mucus membranes or skin necrosis: Yes Has patient had a PCN reaction that required hospitalization No Has patient had a PCN reaction occurring within the last 10 years: No If all of the above answers are "NO", then may proceed with Cephalosporin use.  Social History:   Social History   Socioeconomic History  . Marital status: Divorced    Spouse name: Not on file  . Number of children: Not on file  . Years of education: Not on file  . Highest education level: Not on file  Occupational History  . Occupation: Camera operator: [employer]  Tobacco Use  . Smoking status: Never Smoker  . Smokeless tobacco: Never Used  Substance and Sexual Activity  . Alcohol use: No  . Drug use: No  . Sexual activity: Not Currently  Other Topics Concern  . Not on file  Social History Narrative   Single   Regular Exercise-yes   Occupation: Financial controller for [employer]            Social Determinants of Health   Financial Resource Strain:   . Difficulty of Paying Living Expenses:   Food Insecurity:   . Worried About Charity fundraiser in the Last Year:   . Arboriculturist in the Last Year:   Transportation Needs:   . Film/video editor (Medical):   Marland Kitchen Lack of Transportation (Non-Medical):   Physical Activity:   . Days of Exercise per Week:   . Minutes of Exercise per Session:   Stress:   . Feeling of Stress :   Social Connections:   . Frequency of Communication with Friends and Family:   . Frequency of Social Gatherings with Friends and Family:   .  Attends Religious Services:   . Active Member of Clubs or Organizations:   . Attends Archivist Meetings:   Marland Kitchen Marital Status:   Intimate Partner Violence:   . Fear of Current or Ex-Partner:   . Emotionally Abused:   Marland Kitchen Physically Abused:   . Sexually Abused:     Family History:   The patient's family history includes Colon polyps in his mother; Diabetes in his mother; Heart disease in his father and mother; Hypertension in an other family member. There is no history of Colon cancer, Esophageal cancer, Rectal cancer, or Stomach cancer.    ROS:  Please see the history of present illness.  All other ROS reviewed and negative.     Physical Exam/Data:   Vitals:   07/05/19 1205 07/05/19 1402 07/05/19 1500  BP: (!) 100/59 (!) 141/84   Pulse: 61 77 83  Resp: 18 17 13   Temp: 97.9 F (36.6 C) 97.9 F (36.6 C)   TempSrc: Oral Oral   SpO2: 100% 100% 98%  Weight: 93 kg    Height: 6\' 1"  (1.854 m)     No intake or output data in the 24 hours ending 07/05/19 1555 Last 3 Weights 07/05/2019 06/16/2019 05/16/2019  Weight (lbs) 205 lb 215 lb 12.8 oz 216 lb  Weight (kg) 92.987 kg 97.886 kg 97.977 kg     Body mass index is 27.05 kg/m. General:  Well nourished, well developed, in no acute distress HEENT: normal Lymph: no adenopathy Neck: no JVD Endocrine:  No thryomegaly Vascular: No carotid bruits; FA pulses 2+ bilaterally without bruits  Cardiac:  normal S1, S2; RRR; no murmur  Lungs:  clear to auscultation bilaterally, no wheezing, rhonchi or rales  Abd: soft, nontender, no hepatomegaly  Ext: no edema Musculoskeletal:  No deformities, BUE and BLE strength normal and equal Skin: warm and dry  Neuro:  CNs 2-12 intact, no focal abnormalities noted Psych:  Normal affect    EKG:  The ECG  that was done 07/05/19 was personally reviewed and demonstrates sinus bradycardia with TWI lead III  Relevant CV Studies:  Order echo  Laboratory Data:  High Sensitivity Troponin:   Recent  Labs  Lab 07/05/19 1222 07/05/19 1426  TROPONINIHS 239* 555*      Chemistry Recent Labs  Lab 07/05/19 1222  NA 139  K 3.9  CL 106  CO2 21*  GLUCOSE 124*  BUN 21  CREATININE 1.27*  CALCIUM 9.5  GFRNONAA 58*  GFRAA >60  ANIONGAP 12    No results for input(s): PROT, ALBUMIN, AST, ALT, ALKPHOS, BILITOT in the last 168 hours. Hematology Recent Labs  Lab 07/05/19 1222  WBC 7.8  RBC 5.16  HGB 14.8  HCT 45.4  MCV 88.0  MCH 28.7  MCHC 32.6  RDW 13.4  PLT 246   BNPNo results for input(s): BNP, PROBNP in the last 168 hours.  DDimer No results for input(s): DDIMER in the last 168 hours.   Radiology/Studies:  DG Chest 2 View  Result Date: 07/05/2019 CLINICAL DATA:  Chest pain and pressure since 0400 hours off and on, LEFT arm pain, dizziness, nausea, history diabetes mellitus, hypertension EXAM: CHEST - 2 VIEW COMPARISON:  02/24/2004 FINDINGS: Normal heart size, mediastinal contours, and pulmonary vascularity. Lungs clear. No pleural effusion or pneumothorax. Bones unremarkable. IMPRESSION: Normal exam. Electronically Signed   By: Lavonia Dana M.D.   On: 07/05/2019 13:06   { TIMI Risk Score for Unstable Angina or Non-ST Elevation MI:   The patient's TIMI risk score is 4, which indicates a 20% risk of all cause mortality, new or recurrent myocardial infarction or need for urgent revascularization in the next 14 days.   Assessment and Plan:   NSTEMI/CAD with recent calcium score of 1275 - Presents with chest pain concerning for UA and HS troponin elevated to 239 - IV heparin started - patient is pain free right now - start Aspirin daily - start low dose BB - check echo - h/o of myalgias on statin - avoid nitrates with h/o of BPH on cialis - check TSH - NPO for cardiac cath. Patient had breakfast around [place] pending Risks and benefits of cardiac catheterization have been discussed with the patient.  These include bleeding, infection, kidney damage, stroke, heart  attack, death.  The patient understands these risks and is willing to proceed. - creatinine mildly elevated at 1.27. BMET tomorrow  HLD - goal <70  - recent LDL 123 05/16/19 - h/o of myalgias with statins. Does not want to trial low dose statin - Ezetimibe - might need lipid clinic referral  HTN - amlodipine-olmesartan. Will hold for minimally elevated kidney function - BB as above  DM2 - currently diet-controlled - A1C 6.0 in 04/13/19  BPH - cialis daily, last dose was last night>>will hold during admission - avoid nitroglycerin  GERD - protonix  Severity of Illness: The appropriate patient status for this patient is INPATIENT. Inpatient status is judged to be reasonable and necessary in order to provide the required intensity of service to ensure the patient's safety. The patient's presenting symptoms, physical exam findings, and initial radiographic and laboratory data in the context of their chronic comorbidities is felt to place them at high risk for further clinical deterioration. Furthermore, it is not anticipated that the patient will be medically stable for discharge from the hospital within 2 midnights of admission. The following factors support the patient status of inpatient.   " The patient's presenting symptoms include chest pain. "  The worrisome physical exam findings include chest pain. " The initial radiographic and laboratory data are worrisome because of elevated troponin . " The chronic co-morbidities include HTN, HLD, DM2.   * I certify that at the point of admission it is my clinical judgment that the patient will require inpatient hospital care spanning beyond 2 midnights from the point of admission due to high intensity of service, high risk for further deterioration and high frequency of surveillance required.*    For questions or updates, please contact [place] Please consult [email or web address] for contact info under     Signed, Cadence Ninfa Meeker,  PA-C  07/05/2019 3:55 PM   As above, patient seen and examined.  Briefly he is a 69 year old male with past medical history of diabetes mellitus, hypertension, hyperlipidemia, nephrolithiasis, recent elevated calcium score of 1275 with non-ST elevation myocardial infarction.  Patient awoke this morning with substernal chest pain radiating to his left upper extremity.  It was described as a pressure with associated nausea, dyspnea and diaphoresis.  The pain improved but would get worse with climbing stairs.  He presented to the emergency room and he is presently pain-free. Electrocardiogram shows sinus rhythm with no ST changes.  Creatinine 1.27.  Troponin 239 and 555.  1 non-ST elevation myocardial infarction-patient has ruled in.  Plan to treat with aspirin, heparin, low-dose beta-blocker.  Patient is intolerant to statins.  We will proceed with cardiac catheterization.  The risks and benefits including myocardial infarction, CVA and death discussed and he agrees to proceed.  Note patient takes Cialis for BPH.  Will hold.  No nitrates.  Schedule echocardiogram to assess LV function.  2 hypertension-we will continue Azor.  Add metoprolol for non-ST elevation myocardial infarction.  Follow blood pressure and adjust regimen as needed.  3 hyperlipidemia-patient is intolerant to statins.  Continue Zetia.  Check lipids.  May need Repatha in the future.  4 diabetes mellitus-follow CBGs.  Hold Metformin for 48 hours following catheterization.  Kirk Ruths

## 2019-07-05 NOTE — ED Provider Notes (Signed)
[facility]PART[facility] Provider Note   CSN: [id number] Arrival date & time: 07/05/19  1142     History Chief Complaint  Patient presents with  . Chest Pain    Corey [name] is a 69 y.o. male.  HPI  HPI: A 69 year old patient with a history of treated diabetes, hypertension and hypercholesterolemia presents for evaluation of chest pain. Initial onset of pain was more than 6 hours ago.  The pain awoke him from sleep. the patient's chest pain is described as heaviness/pressure/tightness and it is worse with exertion.  It is located retrosternally and radiates to the left jaw and left arm.  The patient complains of nausea and reports some diaphoresis. The patient's chest pain is middle- or left-sided, is not well-localized, is not sharp and does radiate to the arms/jaw/neck. The patient has a family history of coronary artery disease in a first-degree relative however both parents were in their 10s.. The patient has no history of stroke, has no history of peripheral artery disease, has not smoked in the past 90 days and does not have an elevated BMI (>=30).  Patient had a recent calcium score per his primary care physician.  It was noted to be significantly elevated at 1275.  He was referred to cardiology for risk stratification he had coronary artery calcification seen on CT chest.  Past Medical History:  Diagnosis Date  . Allergic rhinitis   . Allergy   . Arthritis   . Asthma   . Barrett's esophagus   . Cataract    removed both eyes with lens replacement Dr Katy Fitch  . Complication of anesthesia    "took them 6 hours to wake me up"  . Diabetes mellitus without complication ([place])   . GERD (gastroesophageal reflux disease)   . Heart murmur   . History of kidney stones   . HTN (hypertension)   . Hyperlipidemia   . Nephrolithiasis   . Pneumonia    as a kid    Patient Active Problem List   Diagnosis Date Noted  . Type 2 diabetes mellitus without  obesity ([place]) 06/17/2019  . Coronary artery calcification seen on CT scan 06/17/2019  . Hypercholesteremia 06/17/2019  . Coronary atherosclerosis 06/09/2019  . Type 2 diabetes mellitus with hyperglycemia, without long-term current use of insulin ([place]) 07/13/2018  . Uncontrolled diabetes mellitus ([place]) 05/17/2018  . Acute renal failure (ARF) ([place]) 05/17/2018  . OA (osteoarthritis) of hip 04/30/2016  . Osteochondral defect of condyle of femur 12/21/2015  . Lumbar radiculopathy 12/12/2015  . Leg pain, right 11/21/2015  . BPH (benign prostatic hyperplasia) 04/12/2012  . Erectile dysfunction 03/25/2012  . Trochanteric bursitis of left hip 03/22/2012  . Well adult exam 03/13/2011  . Urticaria, chronic 10/03/2010  . Fatigue 07/19/2010  . LIVER FUNCTION TESTS, ABNORMAL, HX OF 01/04/2010  . BRONCHITIS, ACUTE 03/20/2009  . GASTROENTERITIS 07/14/2008  . BARRETTS ESOPHAGUS 01/29/2007  . NEPHROLITHIASIS, HX OF 01/29/2007  . Dyslipidemia 01/19/2007  . Essential hypertension 01/19/2007  . ALLERGIC RHINITIS 01/19/2007  . Asthma 01/19/2007  . GERD 01/19/2007    Past Surgical History:  Procedure Laterality Date  . CATARACT EXTRACTION, BILATERAL     with lens implants   . COLONOSCOPY  01/15/2009   normal   . ESOPHAGOGASTRODUODENOSCOPY  03/20/2006  . EYE SURGERY    . KIDNEY STONE SURGERY     x 4 - most recent 04-2018 at [facility]   . TONSILLECTOMY    . TOTAL HIP ARTHROPLASTY  Right 04/30/2016   Procedure: RIGHT TOTAL HIP ARTHROPLASTY ANTERIOR APPROACH;  Surgeon: Gaynelle Arabian, MD;  Location: [facility];  Service: Orthopedics;  Laterality: Right;  requests 176mins  . TRANSTHORACIC ECHOCARDIOGRAM  01/05/1998  . UPPER GASTROINTESTINAL ENDOSCOPY         Family History  Problem Relation Age of Onset  . Colon polyps Mother   . Heart disease Mother   . Diabetes Mother   . Heart disease Father   . Hypertension Other   . Colon cancer Neg Hx   . Esophageal cancer Neg Hx   . Rectal cancer  Neg Hx   . Stomach cancer Neg Hx     Social History   Tobacco Use  . Smoking status: Never Smoker  . Smokeless tobacco: Never Used  Substance Use Topics  . Alcohol use: No  . Drug use: No    Home Medications Prior to Admission medications   Medication Sig Start Date End Date Taking? Authorizing Provider  acetaminophen (TYLENOL) 650 MG CR tablet Take 650 mg by mouth every 8 (eight) hours as needed for pain.   Yes [provider]  amLODipine-olmesartan (AZOR) 5-40 MG tablet TAKE ONE-HALF (1/2) TABLET BY MOUTH DAILY Patient taking differently: Take 0.5 tablets by mouth daily. TAKE ONE-HALF (1/2) TABLET BY MOUTH DAILY 05/16/19  Yes Plotnikov, Evie Lacks, MD  Ascorbic Acid (VITAMIN C) 1000 MG tablet Take 1,000 mg by mouth 3 (three) times daily.   Yes [provider]  cetirizine (ZYRTEC) 10 MG tablet Take 10 mg by mouth 2 (two) times daily. Takes twice daily   Yes [provider]  cholecalciferol (VITAMIN D3) 25 MCG (1000 UT) tablet Take 5,000 Units by mouth daily.   Yes [provider]  ezetimibe (ZETIA) 10 MG tablet Take 1 tablet (10 mg total) by mouth daily. 06/16/19 09/14/19 Yes Buford Dresser, MD  Magnesium 400 MG CAPS Take by mouth 3 (three) times daily.   Yes [provider]  metFORMIN (GLUCOPHAGE) 500 MG tablet Take 1 tablet (500 mg total) by mouth daily with supper. 04/13/19  Yes Shamleffer, Melanie Crazier, MD  OVER THE COUNTER MEDICATION [id number]  200 mcg daily   Yes [provider]  OVER THE COUNTER MEDICATION [place] Lecithin 1000 mg BID   Yes [provider]  pantoprazole (PROTONIX) 40 MG tablet Take 1 tablet (40 mg total) by mouth daily. Patient taking differently: Take 20 mg by mouth daily.  05/16/19  Yes Plotnikov, Evie Lacks, MD  saw palmetto 160 MG capsule Take 160 mg by mouth 2 (two) times daily.   Yes [provider]  tadalafil (CIALIS) 5 MG tablet TAKE 1 TABLET BY MOUTH IN  THE MORNING -DO NOT TAKE  IFYOU ARE CURRENTLY TAKING   NITRATES. Patient taking differently: Take 5 mg by mouth daily. TAKE 1 TABLET BY MOUTH IN  THE MORNING -DO NOT TAKE IFYOU ARE CURRENTLY TAKING   NITRATES. 05/16/19  Yes Plotnikov, Evie Lacks, MD  Zinc 22.5 MG TABS Take by mouth 2 (two) times a week.   Yes [provider]  Lancets (ONETOUCH DELICA PLUS Q000111Q) MISC Inject 1 Units into the skin 4 (four) times daily. 05/17/18   Plotnikov, Evie Lacks, MD  ONETOUCH VERIO test strip USE TO CHECK BLOOD SUGAR 4 TIMES A DAY 06/10/19   Plotnikov, Evie Lacks, MD    Allergies    Albuterol, Aspirin, Ciprofloxacin, Clarithromycin, Colesevelam, Enalapril maleate, Esomeprazole magnesium, Niacin, Pravastatin sodium, Rosuvastatin, and Penicillins  Review of Systems  Review of Systems  Constitutional: Positive for diaphoresis.  Respiratory: Positive for chest tightness and shortness of breath.   Cardiovascular: Positive for chest pain.  Gastrointestinal: Positive for nausea. Negative for vomiting.  All other systems reviewed and are negative.   Physical Exam Updated Vital Signs BP (!) 141/84 (BP Location: Right Arm)   Pulse 83   Temp 97.9 F (36.6 C) (Oral)   Resp 13   Ht 6\' 1"  (1.854 m)   Wt 93 kg   SpO2 98%   BMI 27.05 kg/m   Physical Exam Vitals and nursing note reviewed.  Constitutional:      General: He is not in acute distress.    Appearance: He is well-developed. He is not diaphoretic.  HENT:     Head: Normocephalic and atraumatic.  Eyes:     General: No scleral icterus.    Conjunctiva/sclera: Conjunctivae normal.  Cardiovascular:     Rate and Rhythm: Normal rate and regular rhythm.     Heart sounds: Normal heart sounds.  Pulmonary:     Effort: Pulmonary effort is normal. No respiratory distress.     Breath sounds: Normal breath sounds.  Abdominal:     Palpations: Abdomen is soft.     Tenderness: There is no abdominal tenderness.  Musculoskeletal:     Cervical back: Normal range of motion  and neck supple.  Skin:    General: Skin is warm and dry.  Neurological:     Mental Status: He is alert.  Psychiatric:        Mood and Affect: Mood is anxious.        Behavior: Behavior normal.     ED Results / Procedures / Treatments   Labs (all labs ordered are listed, but only abnormal results are displayed) Labs Reviewed  BASIC METABOLIC PANEL - Abnormal; Notable for the following components:      Result Value   CO2 21 (*)    Glucose, Bld 124 (*)    Creatinine, Ser 1.27 (*)    GFR calc non Af Amer 58 (*)    All other components within normal limits  TROPONIN I (HIGH SENSITIVITY) - Abnormal; Notable for the following components:   Troponin I (High Sensitivity) 239 (*)    All other components within normal limits  TROPONIN I (HIGH SENSITIVITY) - Abnormal; Notable for the following components:   Troponin I (High Sensitivity) 555 (*)    All other components within normal limits  SARS CORONAVIRUS 2 BY RT PCR (HOSPITAL ORDER, [place] [facility])  CBC  HEPARIN LEVEL (UNFRACTIONATED)  CBG MONITORING, ED    EKG EKG Interpretation  Date/Time:  Tuesday Jul 05 2019 11:55:56 EDT Ventricular Rate:  54 PR Interval:  150 QRS Duration: 92 QT Interval:  392 QTC Calculation: 371 R Axis:   16 Text Interpretation: Sinus bradycardia Junctional ST depression, probably normal Borderline ECG Confirmed by Lennice Sites [phone]) on 07/05/2019 2:12:35 PM   Radiology DG Chest 2 View  Result Date: 07/05/2019 CLINICAL DATA:  Chest pain and pressure since 0400 hours off and on, LEFT arm pain, dizziness, nausea, history diabetes mellitus, hypertension EXAM: CHEST - 2 VIEW COMPARISON:  02/24/2004 FINDINGS: Normal heart size, mediastinal contours, and pulmonary vascularity. Lungs clear. No pleural effusion or pneumothorax. Bones unremarkable. IMPRESSION: Normal exam. Electronically Signed   By: Lavonia Dana M.D.   On: 07/05/2019 13:06    Procedures .Critical Care Performed  by: Margarita Mail, PA-C Authorized by: Margarita Mail, PA-C   Critical  care provider statement:    Critical care time (minutes):  45   Critical care time was exclusive of:  Separately billable procedures and treating other patients   Critical care was necessary to treat or prevent imminent or life-threatening deterioration of the following conditions:  Cardiac failure   Critical care was time spent personally by me on the following activities:  Discussions with consultants, evaluation of patient's response to treatment, examination of patient, ordering and performing treatments and interventions, ordering and review of laboratory studies, ordering and review of radiographic studies, pulse oximetry, re-evaluation of patient's condition, obtaining history from patient or surrogate and review of old charts   (including critical care time)  Medications Ordered in ED Medications  sodium chloride flush (NS) 0.9 % injection 3 mL (has no administration in time range)  nitroGLYCERIN (NITROSTAT) SL tablet 0.4 mg (0.4 mg Sublingual Given 07/05/19 1444)  heparin injection 4,000 Units (4,000 Units Intravenous Not Given 07/05/19 1502)  heparin ADULT infusion 100 units/mL (25000 units/263mL sodium chloride 0.45%) (has no administration in time range)  aspirin chewable tablet 324 mg (324 mg Oral Given 07/05/19 1442)  heparin 5000 UNIT/ML injection (4,000 Units  Given 07/05/19 1445)    ED Course  I have reviewed the triage vital signs and the nursing notes.  Pertinent labs & imaging results that were available during my care of the patient were reviewed by me and considered in my medical decision making (see chart for details).    MDM Rules/Calculators/A&P HEAR Score: 7                   [id number] pain VS: BP (!) 141/84 (BP Location: Right Arm)   Pulse 83   Temp 97.9 F (36.6 C) (Oral)   Resp 13   Ht 6\' 1"  (1.854 m)   Wt 93 kg   SpO2 98%   BMI 27.05 kg/m  [id number] is gathered by patient and  emr. Previous records obtained and reviewed. DDX:The patient's complaint of chest pain involves an extensive number of diagnostic and treatment options, and is a complaint that carries with it a high risk of complications, morbidity, and potential mortality. Given the large differential diagnosis, medical decision making is of high complexity. The emergent differential diagnosis of chest pain includes: Acute coronary syndrome, pericarditis, aortic dissection, pulmonary embolism, tension pneumothorax, pneumonia, and esophageal rupture.  Labs: I ordered reviewed and interpreted labs which include CBC which shows no acute abnormalities, initial troponin which is elevated at 239.  BMP with slightly elevated glucose at 124.  Mildly elevated creatinine at 1.27.  Covid test pending. Imaging: I ordered and reviewed images which included 2 v cxr. I independently visualized and interpreted all imaging. There are no acute, significant findings on today's images. EKG: Sinus tachycardia without evidence of acute ischemic changes Consults: Cardiology [id number] here with chest pain. High risk HEART score. High risk calcium score with active chest pain.  Patient being treated for presumed NSTEMI. Repeat ekg without change. Patient disposition:admit The patient appears reasonably stabilized for admission considering the current resources, flow, and capabilities available in the ED at this time, and I doubt any other [facility] requiring further screening and/or treatment in the ED prior to admission.        Final Clinical Impression(s) / ED Diagnoses Final diagnoses:  NSTEMI (non-ST elevated myocardial infarction) [facility])    Rx / [place] Orders ED Discharge Orders    None       Margarita Mail, PA-C 07/05/19 1545  Lennice Sites, DO 07/08/19 [phone]

## 2019-07-05 NOTE — ED Provider Notes (Signed)
Medical screening examination/treatment/procedure(s) were conducted as a shared visit with non-physician practitioner(s) and myself.  I personally evaluated the patient during the encounter. Briefly, the patient is a 69 y.o. male is a 69 year old male with history of high cholesterol, hypertension, diabetes who presents to the ED with chest pain.  Patient states chest pain started this morning and has been continuous.  Worse with exertion.  Initial EKG shows sinus bradycardia.  No obvious ST elevation.  Patient was waiting in triage when blood test resulted with a positive troponin.  Troponin is 239.  Otherwise lab work is unremarkable.  Chest pain is now 5 out of 10.  Repeat EKG shows sinus rhythm.  No ST elevations.  Of note patient recently had calcium score done and was 93rd percentile.  Patient overall likely with non-ST elevation MI.  Patient given aspirin, nitro and will start heparin and consult cardiology for admission.  Covid test has been ordered.  Patient has been vaccinated.  Does not have any PE risk factors.  Chest x-ray showed no signs of pneumonia, no pneumothorax, no pleural effusion.  This chart was dictated using voice recognition software.  Despite best efforts to proofread,  errors can occur which can change the documentation meaning.     EKG Interpretation  Date/Time:  Tuesday Jul 05 2019 11:55:56 EDT Ventricular Rate:  54 PR Interval:  150 QRS Duration: 92 QT Interval:  392 QTC Calculation: 371 R Axis:   16 Text Interpretation: Sinus bradycardia Junctional ST depression, probably normal Borderline ECG Confirmed by Lennice Sites [phone]) on 07/05/2019 2:12:35 PM           Lennice Sites, DO 07/05/19 1438

## 2019-07-06 ENCOUNTER — Ambulatory Visit: Payer: Managed Care, Other (non HMO) | Admitting: Cardiology

## 2019-07-06 ENCOUNTER — Inpatient Hospital Stay (HOSPITAL_COMMUNITY): Payer: Managed Care, Other (non HMO)

## 2019-07-06 ENCOUNTER — Other Ambulatory Visit: Payer: Self-pay

## 2019-07-06 DIAGNOSIS — R079 Chest pain, unspecified: Secondary | ICD-10-CM

## 2019-07-06 DIAGNOSIS — I25110 Atherosclerotic heart disease of native coronary artery with unstable angina pectoris: Secondary | ICD-10-CM

## 2019-07-06 DIAGNOSIS — Z01810 Encounter for preprocedural cardiovascular examination: Secondary | ICD-10-CM

## 2019-07-06 LAB — HEPATIC FUNCTION PANEL
ALT: 51 U/L — ABNORMAL HIGH (ref 0–44)
AST: 75 U/L — ABNORMAL HIGH (ref 15–41)
Albumin: 3.9 g/dL (ref 3.5–5.0)
Alkaline Phosphatase: 81 U/L (ref 38–126)
Bilirubin, Direct: <0.1 mg/dL (ref 0.0–0.2)
Total Bilirubin: 1.1 mg/dL (ref 0.3–1.2)
Total Protein: 7.4 g/dL (ref 6.5–8.1)

## 2019-07-06 LAB — HEMOGLOBIN A1C
Hgb A1c MFr Bld: 6.3 % — ABNORMAL HIGH (ref 4.8–5.6)
Mean Plasma Glucose: 134.11 mg/dL

## 2019-07-06 LAB — BLOOD GAS, ARTERIAL
Acid-base deficit: 0.2 mmol/L (ref 0.0–2.0)
Bicarbonate: 23.7 mmol/L (ref 20.0–28.0)
Drawn by: 560031
FIO2: 21
O2 Saturation: 97.5 %
Patient temperature: 37
pCO2 arterial: 37.1 mmHg (ref 32.0–48.0)
pH, Arterial: 7.422 (ref 7.350–7.450)
pO2, Arterial: 92 mmHg (ref 83.0–108.0)

## 2019-07-06 LAB — BASIC METABOLIC PANEL
Anion gap: 11 (ref 5–15)
BUN: 16 mg/dL (ref 8–23)
CO2: 22 mmol/L (ref 22–32)
Calcium: 8.9 mg/dL (ref 8.9–10.3)
Chloride: 105 mmol/L (ref 98–111)
Creatinine, Ser: 1.15 mg/dL (ref 0.61–1.24)
GFR calc Af Amer: >60 mL/min (ref >60)
GFR calc non Af Amer: >60 mL/min (ref >60)
Glucose, Bld: 113 mg/dL — ABNORMAL HIGH (ref 70–99)
Potassium: 4.6 mmol/L (ref 3.5–5.1)
Sodium: 138 mmol/L (ref 135–145)

## 2019-07-06 LAB — URINALYSIS, COMPLETE (UACMP) WITH MICROSCOPIC
Bacteria, UA: NONE SEEN
Bilirubin Urine: NEGATIVE
Glucose, UA: NEGATIVE mg/dL
Ketones, ur: NEGATIVE mg/dL
Leukocytes,Ua: NEGATIVE
Nitrite: NEGATIVE
Protein, ur: NEGATIVE mg/dL
Specific Gravity, Urine: 1.005 (ref 1.005–1.030)
pH: 5 (ref 5.0–8.0)

## 2019-07-06 LAB — ABO/RH: ABO/RH(D): B NEG

## 2019-07-06 LAB — HIV ANTIBODY (ROUTINE TESTING W REFLEX): HIV Screen 4th Generation wRfx: NONREACTIVE

## 2019-07-06 LAB — ECHOCARDIOGRAM COMPLETE
Height: 73 in
Weight: 3398.4 oz

## 2019-07-06 LAB — PROTIME-INR
INR: 1.1 (ref 0.8–1.2)
Prothrombin Time: 13.3 seconds (ref 11.4–15.2)

## 2019-07-06 LAB — HEPARIN LEVEL (UNFRACTIONATED)
Heparin Unfractionated: 0.2 IU/mL — ABNORMAL LOW (ref 0.30–0.70)
Heparin Unfractionated: 0.43 IU/mL (ref 0.30–0.70)

## 2019-07-06 LAB — GLUCOSE, CAPILLARY
Glucose-Capillary: 101 mg/dL — ABNORMAL HIGH (ref 70–99)
Glucose-Capillary: 119 mg/dL — ABNORMAL HIGH (ref 70–99)
Glucose-Capillary: 132 mg/dL — ABNORMAL HIGH (ref 70–99)

## 2019-07-06 LAB — SURGICAL PCR SCREEN
MRSA, PCR: NEGATIVE
Staphylococcus aureus: NEGATIVE

## 2019-07-06 LAB — CBC
HCT: 43.4 % (ref 39.0–52.0)
Hemoglobin: 14.2 g/dL (ref 13.0–17.0)
MCH: 28.4 pg (ref 26.0–34.0)
MCHC: 32.7 g/dL (ref 30.0–36.0)
MCV: 86.8 fL (ref 80.0–100.0)
Platelets: 187 10*3/uL (ref 150–400)
RBC: 5 MIL/uL (ref 4.22–5.81)
RDW: 13.7 % (ref 11.5–15.5)
WBC: 7.5 10*3/uL (ref 4.0–10.5)
nRBC: 0 % (ref 0.0–0.2)

## 2019-07-06 LAB — TYPE AND SCREEN
ABO/RH(D): B NEG
Antibody Screen: NEGATIVE

## 2019-07-06 LAB — APTT: aPTT: 53 seconds — ABNORMAL HIGH (ref 24–36)

## 2019-07-06 LAB — TSH: TSH: 1.672 u[IU]/mL (ref 0.350–4.500)

## 2019-07-06 MED ORDER — EPINEPHRINE HCL 5 MG/250ML IV SOLN IN NS
0.0000 ug/min | INTRAVENOUS | Status: DC
  Filled 2019-07-06: qty 250

## 2019-07-06 MED ORDER — CHLORHEXIDINE GLUCONATE 4 % EX LIQD
60.0000 mL | Freq: Once | CUTANEOUS | Status: AC
  Administered 2019-07-07: 4 via TOPICAL
  Filled 2019-07-06: qty 60

## 2019-07-06 MED ORDER — POTASSIUM CHLORIDE 2 MEQ/ML IV SOLN
80.0000 meq | INTRAVENOUS | Status: DC
  Filled 2019-07-06: qty 40

## 2019-07-06 MED ORDER — SODIUM CHLORIDE 0.9 % IV SOLN
1.5000 g | INTRAVENOUS | Status: AC
  Administered 2019-07-07: 1.5 g via INTRAVENOUS
  Filled 2019-07-06: qty 1.5

## 2019-07-06 MED ORDER — TRANEXAMIC ACID 1000 MG/10ML IV SOLN
1.5000 mg/kg/h | INTRAVENOUS | Status: AC
  Administered 2019-07-07: 1.5 mg/kg/h via INTRAVENOUS
  Filled 2019-07-06 (×2): qty 25

## 2019-07-06 MED ORDER — METOPROLOL TARTRATE 12.5 MG HALF TABLET
12.5000 mg | ORAL_TABLET | Freq: Once | ORAL | Status: AC
  Administered 2019-07-07: 12.5 mg via ORAL
  Filled 2019-07-06: qty 1

## 2019-07-06 MED ORDER — VANCOMYCIN HCL 1000 MG IV SOLR
INTRAVENOUS | Status: DC
  Filled 2019-07-06: qty 1000

## 2019-07-06 MED ORDER — PLASMA-LYTE 148 IV SOLN
INTRAVENOUS | Status: DC
  Filled 2019-07-06: qty 2.5

## 2019-07-06 MED ORDER — MAGNESIUM SULFATE 50 % IJ SOLN
40.0000 meq | INTRAMUSCULAR | Status: DC
  Filled 2019-07-06: qty 9.85

## 2019-07-06 MED ORDER — NOREPINEPHRINE 4 MG/250ML-% IV SOLN
0.0000 ug/min | INTRAVENOUS | Status: DC
  Filled 2019-07-06: qty 250

## 2019-07-06 MED ORDER — BISACODYL 5 MG PO TBEC: 5.0000 mg | DELAYED_RELEASE_TABLET | Freq: Once | ORAL | Status: DC

## 2019-07-06 MED ORDER — CHLORHEXIDINE GLUCONATE 0.12 % MT SOLN
15.0000 mL | Freq: Once | OROMUCOSAL | Status: AC
  Administered 2019-07-07: 15 mL via OROMUCOSAL
  Filled 2019-07-06: qty 15

## 2019-07-06 MED ORDER — CHLORHEXIDINE GLUCONATE 4 % EX LIQD
60.0000 mL | Freq: Once | CUTANEOUS | Status: AC
  Administered 2019-07-06: 4 via TOPICAL
  Filled 2019-07-06: qty 15

## 2019-07-06 MED ORDER — SODIUM CHLORIDE 0.9 % IV SOLN
750.0000 mg | INTRAVENOUS | Status: AC
  Administered 2019-07-07: 750 mg via INTRAVENOUS
  Filled 2019-07-06: qty 750

## 2019-07-06 MED ORDER — TRANEXAMIC ACID (OHS) PUMP PRIME SOLUTION
2.0000 mg/kg | INTRAVENOUS | Status: DC
  Filled 2019-07-06: qty 1.93

## 2019-07-06 MED ORDER — PHENYLEPHRINE HCL-NACL 20-0.9 MG/250ML-% IV SOLN
30.0000 ug/min | INTRAVENOUS | Status: AC
  Administered 2019-07-07: 60 ug/min via INTRAVENOUS
  Filled 2019-07-06: qty 250

## 2019-07-06 MED ORDER — INSULIN REGULAR(HUMAN) IN NACL 100-0.9 UT/100ML-% IV SOLN
INTRAVENOUS | Status: AC
  Administered 2019-07-07: 4 [IU]/h via INTRAVENOUS
  Filled 2019-07-06: qty 100

## 2019-07-06 MED ORDER — TEMAZEPAM 15 MG PO CAPS
15.0000 mg | ORAL_CAPSULE | Freq: Once | ORAL | Status: AC | PRN
  Administered 2019-07-06: 15 mg via ORAL
  Filled 2019-07-06: qty 1

## 2019-07-06 MED ORDER — TRANEXAMIC ACID (OHS) BOLUS VIA INFUSION
15.0000 mg/kg | INTRAVENOUS | Status: AC
  Administered 2019-07-07: 1444.5 mg via INTRAVENOUS
  Filled 2019-07-06: qty 1445

## 2019-07-06 MED ORDER — SODIUM CHLORIDE 0.9 % IV SOLN
INTRAVENOUS | Status: DC
  Filled 2019-07-06: qty 30

## 2019-07-06 MED ORDER — DEXMEDETOMIDINE HCL IN NACL 400 MCG/100ML IV SOLN
0.1000 ug/kg/h | INTRAVENOUS | Status: AC
  Administered 2019-07-07: .5 ug/kg/h via INTRAVENOUS
  Filled 2019-07-06: qty 100

## 2019-07-06 MED ORDER — VANCOMYCIN HCL 1500 MG/300ML IV SOLN
1500.0000 mg | INTRAVENOUS | Status: AC
  Administered 2019-07-07: 1500 mg via INTRAVENOUS
  Filled 2019-07-06: qty 300

## 2019-07-06 MED ORDER — MILRINONE LACTATE IN DEXTROSE 20-5 MG/100ML-% IV SOLN
0.3000 ug/kg/min | INTRAVENOUS | Status: DC
  Filled 2019-07-06: qty 100

## 2019-07-06 MED ORDER — NITROGLYCERIN IN D5W 200-5 MCG/ML-% IV SOLN
2.0000 ug/min | INTRAVENOUS | Status: DC
  Filled 2019-07-06: qty 250

## 2019-07-06 MED FILL — Lidocaine HCl Local Preservative Free (PF) Inj 1%: INTRAMUSCULAR | Qty: 30 | Status: AC

## 2019-07-06 NOTE — Progress Notes (Signed)
Progress Note  Patient Name: Corey [name] Date of Encounter: 07/06/2019  Primary Cardiologist: Buford Dresser, MD   Subjective   No CP or dyspnea  Inpatient Medications    Scheduled Meds: . aspirin EC  81 mg Oral Daily  . ezetimibe  10 mg Oral Daily  . metoprolol tartrate  12.5 mg Oral BID  . pantoprazole  40 mg Oral Daily  . sodium chloride flush  3 mL Intravenous Once  . sodium chloride flush  3 mL Intravenous Q12H  . sodium chloride flush  3 mL Intravenous Q12H  . sodium chloride flush  3 mL Intravenous Q12H   Continuous Infusions: . sodium chloride    . sodium chloride    . heparin 1,200 Units/hr (07/06/19 0500)   PRN Meds: sodium chloride, sodium chloride, acetaminophen, ALPRAZolam, nitroGLYCERIN, ondansetron (ZOFRAN) IV, sodium chloride flush, sodium chloride flush, zolpidem   Vital Signs    Vitals:   07/05/19 2000 07/06/19 0001 07/06/19 0420 07/06/19 0806  BP: 127/88 118/70 129/74 136/75  Pulse: 77   80  Resp: 10   15  Temp: 98.5 F (36.9 C) 98.2 F (36.8 C) 98.4 F (36.9 C) 98.2 F (36.8 C)  TempSrc: Oral Oral Oral Oral  SpO2: 100%   99%  Weight:   96.3 kg   Height:        Intake/Output Summary (Last 24 hours) at 07/06/2019 0858 Last data filed at 07/06/2019 0700 Gross per 24 hour  Intake 357.89 ml  Output 950 ml  Net -592.11 ml   Last 3 Weights 07/06/2019 07/05/2019 07/05/2019  Weight (lbs) 212 lb 6.4 oz 208 lb 1.6 oz 205 lb  Weight (kg) 96.344 kg 94.394 kg 92.987 kg      Telemetry    Sinus - Personally Reviewed   Physical Exam   GEN: No acute distress.   Neck: No JVD Cardiac: RRR, no murmurs, rubs, or gallops.  Respiratory: Clear to auscultation bilaterally. GI: Soft, nontender, non-distended  MS: No edema; Radial cath site with no hematoma Neuro:  Nonfocal  Psych: Normal affect   Labs    High Sensitivity Troponin:   Recent Labs  Lab 07/05/19 1222 07/05/19 1426  TROPONINIHS 239* 555*      Chemistry Recent Labs    Lab 07/05/19 1222 07/06/19 0642  NA 139 138  K 3.9 4.6  CL 106 105  CO2 21* 22  GLUCOSE 124* 113*  BUN 21 16  CREATININE 1.27* 1.15  CALCIUM 9.5 8.9  GFRNONAA 58* >60  GFRAA >60 >60  ANIONGAP 12 11     Hematology Recent Labs  Lab 07/05/19 1222 07/06/19 0642  WBC 7.8 7.5  RBC 5.16 5.00  HGB 14.8 14.2  HCT 45.4 43.4  MCV 88.0 86.8  MCH 28.7 28.4  MCHC 32.6 32.7  RDW 13.4 13.7  PLT 246 187    Radiology    DG Chest 2 View  Result Date: 07/05/2019 CLINICAL DATA:  Chest pain and pressure since 0400 hours off and on, LEFT arm pain, dizziness, nausea, history diabetes mellitus, hypertension EXAM: CHEST - 2 VIEW COMPARISON:  02/24/2004 FINDINGS: Normal heart size, mediastinal contours, and pulmonary vascularity. Lungs clear. No pleural effusion or pneumothorax. Bones unremarkable. IMPRESSION: Normal exam. Electronically Signed   By: Lavonia Dana M.D.   On: 07/05/2019 13:06   CARDIAC CATHETERIZATION  Result Date: 07/05/2019  Mid LM to Dist LM lesion is 30% stenosed.  Ost LAD to Prox LAD lesion is 90% stenosed.  Prox RCA lesion  is 100% stenosed.  The left ventricular systolic function is normal.  LV end diastolic pressure is normal.  The left ventricular ejection fraction is 50-55% by visual estimate.  There is no mitral valve regurgitation.  1. Severe 2 vessel CAD    - 90% ostial LAD stenosis. Heavily calcified    - 100% proximal RCA with left to right collateral. Vessel is also heavily calcified. 2. Inferior hypokinesis with overall well preserved LV function 3. Normal LVEDP Plan: patient has complex CAD with total occlusion of a large RCA with collaterals and ostial LAD which supplies a large LAD and large diagonal. It would be difficult to manage this with PCI since stenting of the LAD would require jailing the LCx and could compromise this vessel. I would recommend consideration for CABG. CT surgery consulted.    Patient Profile     69 y.o. male with past medical  history of diabetes mellitus, hypertension, hyperlipidemia admitted with non-ST elevation myocardial infarction.  Cardiac catheterization reveals 30% left main, 90% proximal LAD, occluded right coronary artery and normal LV function.  Coronary artery bypass and graft recommended.  Assessment & Plan    1 non-ST elevation myocardial infarction/coronary artery disease-plan to continue aspirin, heparin, Zetia and metoprolol.  CVTS to evaluate for coronary artery bypass graft. Echo pending.   2 hyperlipidemia-continue Zetia.  Patient is intolerant to statins.  Will need close follow-up as an outpatient and addition of Repatha if needed.  3 hypertension-pressure controlled.  Continue present medications.  For questions or updates, please contact [place] Please consult [email or web address] for contact info under        Signed, Kirk Ruths, MD  07/06/2019, 8:58 AM

## 2019-07-06 NOTE — Consult Note (Addendum)
[address]       [place] [address]             [phone]        Erle D Martinique [place] Medical Record [id number] Date of Birth: October 21, 1950  Referring: Lelon Perla, MD Primary Care: Alain Marion Evie Lacks, MD Primary Cardiologist:Bridgette Harrell Gave, MD  Chief Complaint:    Chief Complaint  Patient presents with  . Chest Pain   History of Present Illness:  Mr. Martinique is a 69 year old male with a past medical history significant for hypertension, type 2 diabetes mellitus diagnosed about 1 year ago, dyslipidemia, gastroesophageal reflux disease, Barrett's esophagus, and degenerative joint disease that has resulted in right hip replacement.  He also has a history of frequently recurring nephrolithiasis.  Approximately 1 month ago he had a discussion with his primary care provider regarding his strong family history of coronary disease in both parents.  The decision was made to proceed with cardiac CT to evaluate coronary calcium scoring.  This was significantly positive with a score in excess of 1200.  Prior to this study, he had not had any symptoms of angina or exertional dyspnea.  Two days ago, however, he noted some exertional chest pressure that would resolve with rest.  This became much more intense when he walked up a flight of stairs.  For this reason, he presented to the emergency room at [facility] on 07/05/19.  While in the waiting area of the ED, he had a more intense episode of chest pain with associated diaphoresis, shortness of breath, and nausea.  He was given a dose of sublingual nitroglycerin with immediate improvement in his symptoms.  Evaluation in the emergency room included an EKG that showed sinus bradycardia with T wave inversions in lead III.  His initial high-sensitivity troponin was 239 and later rose to in excess of 500.  Chest x-ray was unremarkable.  Other lab data was satisfactory except for borderline elevated  creatinine.  His SARS/COVID-19 screen was negative.  Mr. Martinique was treated with aspirin and started on heparin infusion.  He had no further chest pain.  He went on to have left heart catheterization that demonstrated high-grade proximal stenosis in the left anterior descending coronary artery and total occlusion of the proximal right coronary artery.  There were left to right collaterals that filled the right coronary in retrograde fashion.  The circumflex system was free of obstructive disease.  The left ventricular ejection fraction was estimated at 50 to 55% with some inferior segmental wall motion abnormality.  He remains asymptomatic. We have been asked to evaluate Mr. Martinique for consideration of operative coronary revascularization since anatomic location of his coronary lesions make them difficult to approach with percutaneous angioplasty.  Mr. Martinique works full-time from home in the [employer] stage [employer].  In addition, he is in [place] for an [employer] in [place].       Current Activity/ Functional Status:    Zubrod Score: At the time of surgery this patient's most appropriate activity status/level should be described as: []     0    Normal activity, no symptoms [x]     1    Restricted in physical strenuous activity but ambulatory, able to do out light work []     2    Ambulatory and capable of self care, unable to do work activities, up and about  more than 50%  Of the time                            []     3    Only limited self care, in bed greater than 50% of waking hours []     4    Completely disabled, no self care, confined to bed or chair []     5    Moribund  Past Medical History:  Diagnosis Date  . Allergic rhinitis   . Allergy   . Arthritis   . Asthma   . Barrett's esophagus   . Cataract    removed both eyes with lens replacement Dr Katy Fitch  . Complication of anesthesia    "took them 6 hours to wake me up"  . Diabetes  mellitus without complication ([place])   . GERD (gastroesophageal reflux disease)   . Heart murmur   . History of kidney stones   . HTN (hypertension)   . Hyperlipidemia   . Pneumonia    as a kid    Past Surgical History:  Procedure Laterality Date  . CATARACT EXTRACTION, BILATERAL     with lens implants   . COLONOSCOPY  01/15/2009   normal   . ESOPHAGOGASTRODUODENOSCOPY  03/20/2006  . EYE SURGERY    . KIDNEY STONE SURGERY     x 4 - most recent 04-2018 at [facility]   . LEFT HEART CATH AND CORONARY ANGIOGRAPHY N/A 07/05/2019   Procedure: LEFT HEART CATH AND CORONARY ANGIOGRAPHY;  Surgeon: Martinique, Peter M, MD;  Location: [facility];  Service: Cardiovascular;  Laterality: N/A;  . TONSILLECTOMY    . TOTAL HIP ARTHROPLASTY Right 04/30/2016   Procedure: RIGHT TOTAL HIP ARTHROPLASTY ANTERIOR APPROACH;  Surgeon: Gaynelle Arabian, MD;  Location: [facility];  Service: Orthopedics;  Laterality: Right;  requests 167mins  . TRANSTHORACIC ECHOCARDIOGRAM  01/05/1998  . UPPER GASTROINTESTINAL ENDOSCOPY      Social History   Tobacco Use  Smoking Status Never Smoker  Smokeless Tobacco Never Used    Social History   Substance and Sexual Activity  Alcohol Use No     Allergies  Allergen Reactions  . Albuterol     REACTION: "feels like drowning"  . Aspirin Other (See Comments)    Causes bruising  . Ciprofloxacin     REACTION: rash  . Clarithromycin     REACTION: swelling  . Colesevelam     REACTION: constip  . Enalapril Maleate     REACTION: Erectile dysfunction  . Esomeprazole Magnesium     REACTION: epigastric pain  . Niacin     REACTION: diarrhea  . Pravastatin Sodium     REACTION: aching, listless  . Rosuvastatin     REACTION: achy  . Penicillins Hives    Has patient had a PCN reaction causing immediate rash, facial/tongue/throat swelling, SOB or lightheadedness with hypotension: Yes Has patient had a PCN reaction causing severe rash involving mucus membranes or  skin necrosis: Yes Has patient had a PCN reaction that required hospitalization No Has patient had a PCN reaction occurring within the last 10 years: No If all of the above answers are "NO", then may proceed with Cephalosporin use.     Current Facility-Administered Medications  Medication Dose Route Frequency Provider Last Rate Last Admin  . 0.9 %  sodium chloride infusion  250 mL Intravenous PRN Furth, Cadence H, PA-C      . 0.9 %  sodium chloride infusion  250 mL Intravenous PRN Martinique, Peter M, MD      . acetaminophen (TYLENOL) tablet 650 mg  650 mg Oral Q4H PRN Furth, Cadence H, PA-C      . ALPRAZolam Duanne Moron) tablet 0.25 mg  0.25 mg Oral BID PRN Furth, Cadence H, PA-C      . aspirin EC tablet 81 mg  81 mg Oral Daily Furth, Cadence H, PA-C   81 mg at 07/06/19 0950  . [START ON 07/07/2019] cefUROXime (ZINACEF) 1.5 g in sodium chloride 0.9 % 100 mL IVPB  1.5 g Intravenous To OR Rexene Alberts, MD      . Derrill Memo ON 07/07/2019] cefUROXime (ZINACEF) 750 mg in sodium chloride 0.9 % 100 mL IVPB  750 mg Intravenous To OR Rexene Alberts, MD      . Derrill Memo ON 07/07/2019] dexmedetomidine (PRECEDEX) 400 MCG/100ML (4 mcg/mL) infusion  0.1-0.7 mcg/kg/hr Intravenous To OR Rexene Alberts, MD      . Derrill Memo ON 07/07/2019] EPINEPHrine (ADRENALIN) 4 mg in NS 250 mL (0.016 mg/mL) premix infusion  0-10 mcg/min Intravenous To OR Rexene Alberts, MD      . ezetimibe (ZETIA) tablet 10 mg  10 mg Oral Daily Furth, Cadence H, PA-C   10 mg at 07/06/19 0950  . [START ON 07/07/2019] heparin 30,000 units/NS 1000 mL solution for CELLSAVER   Other To OR Rexene Alberts, MD      . heparin ADULT infusion 100 units/mL (25000 units/234mL sodium chloride 0.45%)  1,400 Units/hr Intravenous Continuous Kris Mouton, RPH 14 mL/hr at 07/06/19 1031 1,400 Units/hr at 07/06/19 1031  . [START ON 07/07/2019] heparin sodium (porcine) 2,500 Units, papaverine 30 mg in electrolyte-148 (PLASMALYTE-148) 500 mL irrigation   Irrigation To OR  Rexene Alberts, MD      . Derrill Memo ON 07/07/2019] insulin regular, human (MYXREDLIN) 100 units/ 100 mL infusion   Intravenous To OR Rexene Alberts, MD      . Derrill Memo ON 07/07/2019] magnesium sulfate (IV Push/IM) injection 40 mEq  40 mEq Other To OR Rexene Alberts, MD      . metoprolol tartrate (LOPRESSOR) tablet 12.5 mg  12.5 mg Oral BID Furth, Cadence H, PA-C   12.5 mg at 07/06/19 0950  . [START ON 07/07/2019] milrinone (PRIMACOR) 20 MG/100 ML (0.2 mg/mL) infusion  0.3 mcg/kg/min Intravenous To OR Rexene Alberts, MD      . nitroGLYCERIN (NITROSTAT) SL tablet 0.4 mg  0.4 mg Sublingual Q5 Min x 3 PRN Martinique, Peter M, MD   0.4 mg at 07/05/19 1444  . [START ON 07/07/2019] nitroGLYCERIN 50 mg in dextrose 5 % 250 mL (0.2 mg/mL) infusion  2-200 mcg/min Intravenous To OR Rexene Alberts, MD      . Derrill Memo ON 07/07/2019] norepinephrine (LEVOPHED) 4mg  in 26mL premix infusion  0-40 mcg/min Intravenous To OR Rexene Alberts, MD      . ondansetron [facility]) injection 4 mg  4 mg Intravenous Q6H PRN Martinique, Peter M, MD      . pantoprazole (PROTONIX) EC tablet 40 mg  40 mg Oral Daily Furth, Cadence H, PA-C   40 mg at 07/06/19 0950  . [START ON 07/07/2019] phenylephrine (NEOSYNEPHRINE) 20-0.9 MG/250ML-% infusion  30-200 mcg/min Intravenous To OR Rexene Alberts, MD      . Derrill Memo ON 07/07/2019] potassium chloride injection 80 mEq  80 mEq Other To OR Rexene Alberts, MD      . sodium chloride flush (  NS) 0.9 % injection 3 mL  3 mL Intravenous Once Martinique, Peter M, MD      . sodium chloride flush (NS) 0.9 % injection 3 mL  3 mL Intravenous Q12H Martinique, Peter M, MD      . sodium chloride flush (NS) 0.9 % injection 3 mL  3 mL Intravenous Q12H Furth, Cadence H, PA-C      . sodium chloride flush (NS) 0.9 % injection 3 mL  3 mL Intravenous PRN Furth, Cadence H, PA-C      . sodium chloride flush (NS) 0.9 % injection 3 mL  3 mL Intravenous Q12H Martinique, Peter M, MD      . sodium chloride flush (NS) 0.9 % injection 3 mL  3 mL  Intravenous PRN Martinique, Peter M, MD      . Derrill Memo ON 07/07/2019] tranexamic acid (CYKLOKAPRON) 2,500 mg in sodium chloride 0.9 % 250 mL (10 mg/mL) infusion  1.5 mg/kg/hr Intravenous To OR Rexene Alberts, MD      . Derrill Memo ON 07/07/2019] tranexamic acid (CYKLOKAPRON) bolus via infusion - over 30 minutes 1,444.5 mg  15 mg/kg Intravenous To OR Rexene Alberts, MD      . Derrill Memo ON 07/07/2019] tranexamic acid (CYKLOKAPRON) pump prime solution 193 mg  2 mg/kg Intracatheter To OR Rexene Alberts, MD      . Derrill Memo ON 07/07/2019] vancomycin (VANCOCIN) 1,000 mg in sodium chloride 0.9 % 1,000 mL irrigation   Irrigation To OR Rexene Alberts, MD      . Derrill Memo ON 07/07/2019] vancomycin (VANCOREADY) IVPB 1500 mg/300 mL  1,500 mg Intravenous To OR Rexene Alberts, MD      . zolpidem (AMBIEN) tablet 5 mg  5 mg Oral QHS PRN Furth, Cadence H, PA-C        Medications Prior to Admission  Medication Sig Dispense Refill Last Dose  . acetaminophen (TYLENOL) 650 MG CR tablet Take 650 mg by mouth every 8 (eight) hours as needed for pain.   07/04/2019 at Unknown time  . amLODipine-olmesartan (AZOR) 5-40 MG tablet TAKE ONE-HALF (1/2) TABLET BY MOUTH DAILY (Patient taking differently: Take 0.5 tablets by mouth daily. TAKE ONE-HALF (1/2) TABLET BY MOUTH DAILY) 90 tablet 3 07/05/2019 at Unknown time  . Ascorbic Acid (VITAMIN C) 1000 MG tablet Take 1,000 mg by mouth 3 (three) times daily.   07/05/2019 at Unknown time  . cetirizine (ZYRTEC) 10 MG tablet Take 10 mg by mouth 2 (two) times daily. Takes twice daily   07/05/2019 at Unknown time  . cholecalciferol (VITAMIN D3) 25 MCG (1000 UT) tablet Take 5,000 Units by mouth daily.   07/05/2019 at Unknown time  . ezetimibe (ZETIA) 10 MG tablet Take 1 tablet (10 mg total) by mouth daily. 90 tablet 3 07/05/2019 at Unknown time  . Magnesium 400 MG CAPS Take by mouth 3 (three) times daily.   07/05/2019 at Unknown time  . metFORMIN (GLUCOPHAGE) 500 MG tablet Take 1 tablet (500 mg total) by mouth  daily with supper. 30 tablet 6 Past Month at Unknown time  . OVER THE COUNTER MEDICATION [id number]  200 mcg daily   07/05/2019 at Unknown time  . OVER THE COUNTER MEDICATION [place] Lecithin 1000 mg BID   07/05/2019 at Unknown time  . pantoprazole (PROTONIX) 40 MG tablet Take 1 tablet (40 mg total) by mouth daily. (Patient taking differently: Take 20 mg by mouth daily. ) 90 tablet 3 07/04/2019 at Unknown time  . saw palmetto 160 MG  capsule Take 160 mg by mouth 2 (two) times daily.   07/05/2019 at Unknown time  . tadalafil (CIALIS) 5 MG tablet TAKE 1 TABLET BY MOUTH IN  THE MORNING -DO NOT TAKE IFYOU ARE CURRENTLY TAKING   NITRATES. (Patient taking differently: Take 5 mg by mouth daily. TAKE 1 TABLET BY MOUTH IN  THE MORNING -DO NOT TAKE IFYOU ARE CURRENTLY TAKING   NITRATES.) 90 tablet 3 07/04/2019 at Unknown time  . Zinc 22.5 MG TABS Take by mouth 2 (two) times a week.   07/05/2019 at Unknown time  . Lancets (ONETOUCH DELICA PLUS [id number]) MISC Inject 1 Units into the skin 4 (four) times daily. 450 each 3   . ONETOUCH VERIO test strip USE TO CHECK BLOOD SUGAR 4 TIMES A DAY 400 strip 3     Family History  Problem Relation Age of Onset  . Colon polyps Mother   . Heart disease Mother   . Diabetes Mother   . Heart disease Father   . Hypertension Other   . Colon cancer Neg Hx   . Esophageal cancer Neg Hx   . Rectal cancer Neg Hx   . Stomach cancer Neg Hx      Review of Systems:   ROS    Cardiac Review of Systems: Y or  [    ]= no  Chest Pain [  x  ]  Resting SOB [   ] Exertional SOB  [x  ]  Orthopnea [  ]   Pedal Edema [   ]    Palpitations [  ] Syncope  [  ]   Presyncope [   ]  General Review of Systems: [Y] = yes [  ]=no Constitional: recent weight change [  ]; anorexia [  ]; fatigue [  ]; nausea [x  ]; night sweats [  ]; fever [  ]; or chills [  ]                                                               Dental: Last Dentist visit:   Eye : blurred vision [  ]; diplopia [   ]; vision  changes [  ];  Amaurosis fugax[  ]; Resp: cough [  ];  wheezing[  ];  hemoptysis[  ]; shortness of breath[ x ]; paroxysmal nocturnal dyspnea[  ]; dyspnea on exertion[  ]; or orthopnea[  ];  GI:  gallstones[  ], vomiting[  ];  dysphagia[  ]; melena[  ];  hematochezia [  ]; heartburn[  ];   Hx of  Colonoscopy[  ]; GU: kidney stones [x  ]; hematuria[  ];   dysuria [  ];  nocturia[  ];  history of     obstruction [  ]; urinary frequency [  ]             Skin: rash, swelling[  ];, hair loss[  ];  peripheral edema[  ];  or itching[  ]; Musculosketetal: myalgias[  ];  joint swelling[  ];  joint erythema[  ];  joint pain[  ];  back pain[  ];  Heme/Lymph: bruising[  ];  bleeding[  ];  anemia[  ];  Neuro: TIA[  ];  headaches[  ];  stroke[  ];  vertigo[  ];  seizures[  ];   paresthesias[  ];  difficulty walking[  ];  Psych:depression[  ]; anxiety[  ];  Endocrine: diabetes[x  ];  thyroid dysfunction[  ];                 Physical Exam: BP 136/75 (BP Location: Left Arm)   Pulse 80   Temp 98.2 F (36.8 C) (Oral)   Resp 15   Ht 6\' 1"  (1.854 m)   Wt 96.3 kg   SpO2 99%   BMI 28.02 kg/m    General appearance: alert, cooperative and no distress Head: Normocephalic, without obvious abnormality, atraumatic Neck: no adenopathy, no carotid bruit, no JVD, supple, symmetrical, trachea midline and thyroid not enlarged, symmetric, no tenderness/mass/nodules Lymph nodes: Cervical, supraclavicular, and axillary nodes normal. Resp: clear to auscultation bilaterally Cardio: Heart sounds are distant.  He has a regular rate and rhythm.  No murmur. GI: soft, non-tender; bowel sounds normal; no masses,  no organomegaly Extremities: All extremities are well perfused with palpable pulses throughout.  He has no obvious varicosities in his lower extremities. Neurologic: Grossly normal  Diagnostic Studies & Laboratory data:  LEFT HEART CATH AND CORONARY ANGIOGRAPHY     Conclusion Mid LM to Dist LM lesion is 30%  stenosed.  Ost LAD to Prox LAD lesion is 90% stenosed.  Prox RCA lesion is 100% stenosed.  The left ventricular systolic function is normal.  LV end diastolic pressure is normal.  The left ventricular ejection fraction is 50-55% by visual estimate.  There is no mitral valve regurgitation. 1. Severe 2 vessel CAD  - 90% ostial LAD stenosis. Heavily calcified  - 100% proximal RCA with left to right collateral. Vessel is also heavily calcified.  2. Inferior hypokinesis with overall well preserved LV function  3. Normal LVEDP  Plan: patient has complex CAD with total occlusion of a large RCA with collaterals and ostial LAD which supplies a large LAD and large diagonal. It would be difficult to manage this with PCI since stenting of the LAD would require jailing the LCx and could compromise this vessel. I would recommend consideration for CABG. CT surgery consulted.      Recommendations Antiplatelet/Anticoag Recommend Aspirin 81mg  daily for moderate CAD.        Indications Non-ST elevation (NSTEMI) myocardial infarction ([place]) [I21.4 (ICD-10-CM)]     Procedural Details Technical Details Indication: 69 yo WM with recent CT showing high coronary calcium score presents with NSTEMI  Procedural Details: The right wrist was prepped, draped, and anesthetized with 1% lidocaine. Using the modified Seldinger technique, a 6 French slender sheath was introduced into the right radial artery. 3 mg of verapamil was administered through the sheath, weight-based unfractionated heparin was administered intravenously. Standard Judkins catheters were used for selective coronary angiography and left ventriculography. Catheter exchanges were performed over an exchange length guidewire. There were no immediate procedural complications. A TR band was used for radial hemostasis at the completion of the procedure. The patient was transferred to the post catheterization recovery area for further monitoring. Contrast: 50  cc Estimated blood loss <50 mL.   During this procedure medications were administered to achieve and maintain moderate conscious sedation while the patient's heart rate, blood pressure, and oxygen saturation were continuously monitored and I was present face-to-face 100% of this time.     Medications (Filter: Administrations occurring from 07/05/19 1626 to 07/05/19 1715)  Continuous medications are totaled by the amount administered until 07/05/19 1715.  Heparin (Porcine)  in NaCl 1000-0.9 UT/500ML-% SOLN (mL) Total volume: 1,000 mL   Date/Time   Rate/Dose/Volume Action  07/05/19 1638  500 mL Given  1639  500 mL Given  midazolam (VERSED) injection (mg) Total dose: 2 mg   Date/Time   Rate/Dose/Volume Action  07/05/19 1639  2 mg Given  fentaNYL (SUBLIMAZE) injection (mcg) Total dose: 25 mcg   Date/Time   Rate/Dose/Volume Action  07/05/19 1641  25 mcg Given  lidocaine (PF) (XYLOCAINE) 1 % injection (mL) Total volume: 2 mL   Date/Time   Rate/Dose/Volume Action  07/05/19 1650  2 mL Given  Radial Cocktail/Verapamil only (mL) Total volume: 10 mL   Date/Time   Rate/Dose/Volume Action  07/05/19 1651  10 mL Given  heparin sodium (porcine) injection (Units) Total dose: 4,500 Units   Date/Time   Rate/Dose/Volume Action  07/05/19 1653  4,500 Units Given  0.9 % sodium chloride infusion (mL/hr) Total dose: Cannot be calculated* Dosing weight: 93   *Continuous medication not stopped within the calculation time range.  Date/Time   Rate/Dose/Volume Action  07/05/19 1644  93 mL/hr New Bag/Given  iohexol (OMNIPAQUE) 350 MG/ML injection (mL) Total volume: 50 mL   Date/Time   Rate/Dose/Volume Action  07/05/19 1710  50 mL Given  nitroGLYCERIN (NITROSTAT) SL tablet 0.4 mg (mg) Total dose: Cannot be calculated* Dosing weight: 93   *Administration dose not documented  Date/Time   Rate/Dose/Volume Action  07/05/19 1626  *Not included in total MAR Hold  sodium chloride flush (NS)  0.9 % injection 3 mL (mL) Total dose: Cannot be calculated* Dosing weight: 93   *Administration dose not documented  Date/Time   Rate/Dose/Volume Action  07/05/19 1626  *Not included in total MAR Hold  sodium chloride flush (NS) 0.9 % injection 3 mL (mL) Total dose: Cannot be calculated* Dosing weight: 93   *Administration dose not documented  Date/Time   Rate/Dose/Volume Action  07/05/19 1626  *Not included in total MAR Hold  heparin ADULT infusion 100 units/mL (25000 units/272mL sodium chloride 0.45%) (Units/hr) Total dose: Cannot be calculated* Dosing weight: 93   *Administration dose not documented  Date/Time   Rate/Dose/Volume Action  07/05/19 1632   Stopped  heparin injection 4,000 Units (Units) Total dose: Cannot be calculated* Dosing weight: 93   *Administration dose not documented  Date/Time   Rate/Dose/Volume Action  07/05/19 1626  *Not included in total MAR Hold     Sedation Time Sedation Time Physician-1: 24 minutes 32 seconds        Contrast Medication Name Total Dose  iohexol (OMNIPAQUE) 350 MG/ML injection 50 mL     Radiation/Fluoro Fluoro time: 3.8 (min)  DAP: 19 (Gycm2)  Cumulative Air Kerma: [id number] (mGy)     Complications Complications documented before study signed (07/05/2019 [id number] PM)  No complications were associated with this study.  Documented by Martinique, Peter M, MD - 07/05/2019 5:16 PM     Coronary Findings Diagnostic Dominance: Right  Left Main  Mid LM to Dist LM lesion 30% stenosed  Mid LM to Dist LM lesion is 30% stenosed.   Left Anterior Descending  Vessel was injected. Vessel is large. The vessel is severely calcified.  Ost LAD to Prox LAD lesion 90% stenosed  Ost LAD to Prox LAD lesion is 90% stenosed.   Left Circumflex  Vessel was injected. Vessel is normal in caliber. Vessel is angiographically normal.   Right Coronary Artery  Vessel was injected. Vessel is large. The vessel is severely calcified.  Prox RCA lesion 100%  stenosed  Prox RCA lesion is 100% stenosed.   Right Posterior Descending Artery  Collaterals  RPDA filled by collaterals from Dist LAD.    Intervention No interventions have been documented.                      Wall Motion Resting                    Left Heart Left Ventricle The left ventricular size is normal. The left ventricular systolic function is normal. LV end diastolic pressure is normal. The left ventricular ejection fraction is 50-55% by visual estimate. There are LV function abnormalities due to segmental dysfunction. There is no evidence of mitral regurgitation.     Coronary Diagrams Diagnostic Dominance: Right       Recent Radiology Findings:   DG Chest 2 View  Result Date: 07/05/2019 CLINICAL DATA:  Chest pain and pressure since 0400 hours off and on, LEFT arm pain, dizziness, nausea, history diabetes mellitus, hypertension EXAM: CHEST - 2 VIEW COMPARISON:  02/24/2004 FINDINGS: Normal heart size, mediastinal contours, and pulmonary vascularity. Lungs clear. No pleural effusion or pneumothorax. Bones unremarkable. IMPRESSION: Normal exam. Electronically Signed   By: Lavonia Dana M.D.   On: 07/05/2019 13:06   CARDIAC CATHETERIZATION  Result Date: 07/05/2019  Mid LM to Dist LM lesion is 30% stenosed.  Ost LAD to Prox LAD lesion is 90% stenosed.  Prox RCA lesion is 100% stenosed.  The left ventricular systolic function is normal.  LV end diastolic pressure is normal.  The left ventricular ejection fraction is 50-55% by visual estimate.  There is no mitral valve regurgitation.  1. Severe 2 vessel CAD    - 90% ostial LAD stenosis. Heavily calcified    - 100% proximal RCA with left to right collateral. Vessel is also heavily calcified. 2. Inferior hypokinesis with overall well preserved LV function 3. Normal LVEDP Plan: patient has complex CAD with total occlusion of a large RCA with collaterals and ostial LAD which supplies a large LAD and large diagonal.  It would be difficult to manage this with PCI since stenting of the LAD would require jailing the LCx and could compromise this vessel. I would recommend consideration for CABG. CT surgery consulted.   VAS US DOPPLER PRE CABG  Result Date: 07/06/2019 PREOPERATIVE VASCULAR EVALUATION  Indications:      Pre-CABG. Risk Factors:     Hypertension, hyperlipidemia, Diabetes. Comparison Study: no prior Performing Technologist: Abram Sander RVS  Examination Guidelines: A complete evaluation includes B-mode imaging, spectral Doppler, color Doppler, and power Doppler as needed of all accessible portions of each vessel. Bilateral testing is considered an integral part of a complete examination. Limited examinations for reoccurring indications may be performed as noted.  Right Carotid Findings: +----------+--------+--------+--------+------------+--------+           PSV cm/sEDV cm/sStenosisDescribe    Comments +----------+--------+--------+--------+------------+--------+ CCA Prox  118     13              heterogenous         +----------+--------+--------+--------+------------+--------+ CCA Distal108     19              heterogenous         +----------+--------+--------+--------+------------+--------+ ICA Prox  71      21      1-39%   heterogenous         +----------+--------+--------+--------+------------+--------+ ICA Distal102     30                                   +----------+--------+--------+--------+------------+--------+  ECA       116     1                                    +----------+--------+--------+--------+------------+--------+  +----------+--------+-------+--------+------------+           PSV cm/sEDV cmsDescribeArm Pressure +----------+--------+-------+--------+------------+ Subclavian113                                 +----------+--------+-------+--------+------------+ +---------+--------+--+--------+--+---------+ VertebralPSV cm/s67EDV  cm/s14Antegrade +---------+--------+--+--------+--+---------+ Left Carotid Findings: +----------+--------+--------+--------+------------+--------+           PSV cm/sEDV cm/sStenosisDescribe    Comments +----------+--------+--------+--------+------------+--------+ CCA Prox  118     20              heterogenous         +----------+--------+--------+--------+------------+--------+ CCA Distal103     21              heterogenous         +----------+--------+--------+--------+------------+--------+ ICA Prox  69      25      1-39%   heterogenous         +----------+--------+--------+--------+------------+--------+ ICA Distal74      32                                   +----------+--------+--------+--------+------------+--------+ ECA       102     8                                    +----------+--------+--------+--------+------------+--------+ +----------+--------+--------+--------+------------+ SubclavianPSV cm/sEDV cm/sDescribeArm Pressure +----------+--------+--------+--------+------------+           165                                  +----------+--------+--------+--------+------------+ +---------+--------+--+--------+--+---------+ VertebralPSV cm/s69EDV cm/s20Antegrade +---------+--------+--+--------+--+---------+  ABI Findings: +--------+------------------+-----+---------+--------+ Right   Rt Pressure (mmHg)IndexWaveform Comment  +--------+------------------+-----+---------+--------+ Brachial                       triphasic         +--------+------------------+-----+---------+--------+ PTA                            triphasic         +--------+------------------+-----+---------+--------+ DP                             triphasic         +--------+------------------+-----+---------+--------+ +--------+------------------+-----+---------+-------+ Left    Lt Pressure (mmHg)IndexWaveform Comment  +--------+------------------+-----+---------+-------+ Brachial                       triphasic        +--------+------------------+-----+---------+-------+ PTA                            triphasic        +--------+------------------+-----+---------+-------+ DP                             triphasic        +--------+------------------+-----+---------+-------+  Right Doppler Findings: +--------+--------+-----+---------+--------+ Site    PressureIndexDoppler  Comments +--------+--------+-----+---------+--------+ Brachial             triphasic         +--------+--------+-----+---------+--------+ Radial               triphasic         +--------+--------+-----+---------+--------+ Ulnar                triphasic         +--------+--------+-----+---------+--------+  Left Doppler Findings: +--------+--------+-----+---------+--------+ Site    PressureIndexDoppler  Comments +--------+--------+-----+---------+--------+ Brachial             triphasic         +--------+--------+-----+---------+--------+ Radial               triphasic         +--------+--------+-----+---------+--------+ Ulnar                triphasic         +--------+--------+-----+---------+--------+  Summary: Right Carotid: Velocities in the right ICA are consistent with a 1-39% stenosis. Left Carotid: Velocities in the left ICA are consistent with a 1-39% stenosis. Vertebrals: Bilateral vertebral arteries demonstrate antegrade flow. Right Upper Extremity: Doppler waveforms remain within normal limits with right radial compression. Doppler waveforms remain within normal limits with right ulnar compression. Left Upper Extremity: Doppler waveforms decrease 50% with left radial compression. Doppler waveform obliterate with left ulnar compression.     Preliminary      I have independently reviewed the above radiologic studies and discussed with the patient   Recent Lab Findings: Lab Results    Component Value Date   WBC 7.5 07/06/2019   HGB 14.2 07/06/2019   HCT 43.4 07/06/2019   PLT 187 07/06/2019   GLUCOSE 113 (H) 07/06/2019   CHOL 186 05/16/2019   TRIG 93.0 05/16/2019   HDL 43.90 05/16/2019   LDLDIRECT 191.5 04/15/2013   LDLDIRECT 193.6 04/15/2013   LDLCALC 123 (H) 05/16/2019   ALT 51 (H) 07/06/2019   AST 75 (H) 07/06/2019   NA 138 07/06/2019   K 4.6 07/06/2019   CL 105 07/06/2019   CREATININE 1.15 07/06/2019   BUN 16 07/06/2019   CO2 22 07/06/2019   TSH 1.672 07/06/2019   INR 1.1 07/06/2019   HGBA1C 6.3 (H) 07/06/2019      Assessment / Plan:    Mr. Martinique is very pleasant 69 year old male with the above described past medical history.  He presents with acute non-ST elevation myocardial infarction, two-vessel coronary artery disease, and well-preserved left ventricular function.  Operative coronary revascularization is his best option based on location of his coronary lesions.  The general nature of coronary bypass grafting was discussed with Mr. Martinique.  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  He would like for [place] to proceed with preoperative work-up in preparation for surgery in the near future.  We will review  routine vascular studies and the echocardiogram which is pending at this time.  We will tentatively plan for surgery tomorrow.  Mr. Martinique is in agreement with this.        I  spent 40 minutes counseling the patient face to face.   Antony Odea, PA-C [phone] 07/06/2019 3:35 PM     I have seen and examined the patient and agree with the assessment as outlined  above by Enid Cutter, PA-C.  Patient is a 69 year old male with no previous history of coronary artery disease but risk factors notable for history of type 2 diabetes mellitus, hyperlipidemia, and a family history of coronary artery  disease.  He presents with recent onset symptoms accelerating chest pain culminating in a prolonged episode of chest pain yesterday while he was in the emergency department that was ultimately relieved by administration of nitroglycerin.  Serial troponin levels were weakly positive consistent with acute coronary syndrome and mild non-ST segment elevation myocardial infarction.  Patient has remained pain-free since he was admitted to the hospital.  I have personally reviewed the patient's diagnostic cardiac catheterization and transthoracic echocardiogram.  Catheterization revealed severe two-vessel coronary artery disease with high-grade long segment proximal stenosis of the left anterior descending coronary artery and 100% chronic occlusion of the right coronary artery with left-to-right collateral filling of the terminal branches of the right coronary artery.  There is mild nonobstructive disease in the left circumflex territory.  There is preserved left ventricular systolic dysfunction.  I agree the patient would best be treated with surgical revascularization.  I have reviewed the indications, risks, and potential benefits of coronary artery bypass grafting with the patient and his friends in his hospital room.  Alternative treatment strategies have been discussed, including the relative risks, benefits and long term prognosis associated with medical therapy, percutaneous coronary intervention, and surgical revascularization.  The patient understands and accepts all potential associated risks of surgery including but not limited to risk of death, stroke or other neurologic complication, myocardial infarction, congestive heart failure, respiratory failure, renal failure, bleeding requiring blood transfusion and/or reexploration, aortic dissection or other major vascular complication, arrhythmia, heart block or bradycardia requiring permanent pacemaker, pneumonia, pleural effusion, wound infection, pulmonary  embolus or other thromboembolic complication, chronic pain or other delayed complications related to median sternotomy, or the late recurrence of symptomatic ischemic heart disease and/or congestive heart failure.  The importance of long term risk modification have been emphasized.  All questions answered.  We plan CABG in am tomorrow.   I spent in excess of 60 minutes during the conduct of this hospital encounter and >50% of this time involved direct face-to-face encounter with the patient for counseling and/or coordination of their care.    Rexene Alberts, MD 07/06/2019 5:38 PM

## 2019-07-06 NOTE — Progress Notes (Signed)
[id number] Discussed with pt the importance of IS and mobility after surgery. No IS available on unit. Asked RN to order one. Pt has used IS with his hip surgery so he is familiar with one. Pt has been reading [facility] booklet. Discussed sternal precautions and staying in the tube. Wrote down how to view pre op video. Pt has a friend who is available to help with care after discharge. Will follow up after surgery. Graylon Good RN BSN 07/06/2019 2:22 PM

## 2019-07-06 NOTE — Progress Notes (Signed)
Pre cabg has been completed.   Preliminary results in CV Proc.   Abram Sander 07/06/2019 11:16 AM

## 2019-07-06 NOTE — Anesthesia Preprocedure Evaluation (Addendum)
Anesthesia Evaluation  Patient identified by MRN, date of birth, ID band Patient awake    Reviewed: Allergy & Precautions, NPO status , Patient's Chart, lab work & pertinent test results  History of Anesthesia Complications (+) PROLONGED EMERGENCE and history of anesthetic complications  Airway Mallampati: II  TM Distance: >3 FB Neck ROM: Full    Dental  (+) Dental Advisory Given, Teeth Intact   Pulmonary asthma ,    Pulmonary exam normal        Cardiovascular hypertension, Pt. on medications + CAD and + Past MI  Normal cardiovascular exam   '21 TTE - EF 45 to 50%. Grade I diastolic dysfunction (impaired relaxation). The inferior wall and posterior wall are hypokinetic.   '21 Carotid US - 1-39% bl ICAS  '21 Cath - Mid LM to Dist LM lesion is 30% stenosed. Ost LAD to Prox LAD lesion is 90% stenosed. Prox RCA lesion is 100% stenosed. The left ventricular systolic function is normal. LV end diastolic pressure is normal. EF 50-55% by visual estimate. There is no mitral valve regurgitation    Neuro/Psych negative neurological ROS  negative psych ROS   GI/Hepatic Neg liver ROS, GERD  Medicated and Controlled,  Endo/Other  diabetes, Type 2, Oral Hypoglycemic Agents  Renal/GU Renal InsufficiencyRenal disease     Musculoskeletal  (+) Arthritis ,   Abdominal   Peds  Hematology negative hematology ROS (+)   Anesthesia Other Findings Covid neg 5/11  Reproductive/Obstetrics                            Anesthesia Physical Anesthesia Plan  ASA: IV  Anesthesia Plan: General   Post-op Pain Management:    Induction: Intravenous  PONV Risk Score and Plan: 2 and Treatment may vary due to age or medical condition  Airway Management Planned: Oral ETT  Additional Equipment: Arterial line, CVP, TEE and Ultrasound Guidance Line Placement  Intra-op Plan:   Post-operative Plan:  Post-operative intubation/ventilation  Informed Consent: I have reviewed the patients History and Physical, chart, labs and discussed the procedure including the risks, benefits and alternatives for the proposed anesthesia with the patient or authorized representative who has indicated his/her understanding and acceptance.     Dental advisory given  Plan Discussed with: CRNA and Anesthesiologist  Anesthesia Plan Comments:        Anesthesia Quick Evaluation

## 2019-07-06 NOTE — Progress Notes (Signed)
  Echocardiogram 2D Echocardiogram has been performed.  Jurell Basista A Ravis Herne 07/06/2019, 2:46 PM

## 2019-07-06 NOTE — Progress Notes (Signed)
ANTICOAGULATION CONSULT NOTE - Follow Up Consult  Pharmacy Consult for heparin Indication: chest pain/ACS  Allergies  Allergen Reactions  . Albuterol     REACTION: "feels like drowning"  . Aspirin Other (See Comments)    Causes bruising  . Ciprofloxacin     REACTION: rash  . Clarithromycin     REACTION: swelling  . Colesevelam     REACTION: constip  . Enalapril Maleate     REACTION: Erectile dysfunction  . Esomeprazole Magnesium     REACTION: epigastric pain  . Niacin     REACTION: diarrhea  . Pravastatin Sodium     REACTION: aching, listless  . Rosuvastatin     REACTION: achy  . Penicillins Hives    Has patient had a PCN reaction causing immediate rash, facial/tongue/throat swelling, SOB or lightheadedness with hypotension: Yes Has patient had a PCN reaction causing severe rash involving mucus membranes or skin necrosis: Yes Has patient had a PCN reaction that required hospitalization No Has patient had a PCN reaction occurring within the last 10 years: No If all of the above answers are "NO", then may proceed with Cephalosporin use.     Patient Measurements: Height: 6\' 1"  (185.4 cm) Weight: 96.3 kg (212 lb 6.4 oz) IBW/kg (Calculated) : 79.9 Heparin Dosing Weight: 96.3 kg  Vital Signs: Temp: 98.2 F (36.8 C) (05/12 0806) Temp Source: Oral (05/12 0806) BP: 136/75 (05/12 0806) Pulse Rate: 80 (05/12 0806)  Labs: Recent Labs    07/05/19 1222 07/05/19 1426 07/06/19 0642  HGB 14.8  --  14.2  HCT 45.4  --  43.4  PLT 246  --  187  HEPARINUNFRC  --   --  0.20*  CREATININE 1.27*  --  1.15  TROPONINIHS 239* 555*  --     Estimated Creatinine Clearance: 77.3 mL/min (by C-G formula based on SCr of 1.15 mg/dL).   Medications:  Scheduled:  . aspirin EC  81 mg Oral Daily  . ezetimibe  10 mg Oral Daily  . metoprolol tartrate  12.5 mg Oral BID  . pantoprazole  40 mg Oral Daily  . sodium chloride flush  3 mL Intravenous Once  . sodium chloride flush  3 mL  Intravenous Q12H  . sodium chloride flush  3 mL Intravenous Q12H  . sodium chloride flush  3 mL Intravenous Q12H    Assessment: 43 YOM who presents with chest pain found to have elevated troponins and started on IV heparin.  Patient went to cath, which found severe 2vdz requiring CVTS evaluation for CABG.   Pharmacy consulted to dose IV heparin.   Present heparin infusion 1200 units/hr. HL 0.2 requiring intervention.   Goal of Therapy:  Heparin level 0.3-0.7 units/ml Monitor platelets by anticoagulation protocol: Yes   Plan:  Increase heparin infusion at 1400 units/hr Check HL in 6h  Continue H&H and PLT daily   Laurey Arrow  [facility] PharmD Candidate 2022 07/06/2019,9:58 AM

## 2019-07-06 NOTE — Progress Notes (Addendum)
ANTICOAGULATION CONSULT NOTE - Follow Up Consult  Pharmacy Consult for Heparin Indication: chest pain/ACS  Allergies  Allergen Reactions  . Albuterol     REACTION: "feels like drowning"  . Aspirin Other (See Comments)    Causes bruising  . Ciprofloxacin     REACTION: rash  . Clarithromycin     REACTION: swelling  . Colesevelam     REACTION: constip  . Enalapril Maleate     REACTION: Erectile dysfunction  . Esomeprazole Magnesium     REACTION: epigastric pain  . Niacin     REACTION: diarrhea  . Pravastatin Sodium     REACTION: aching, listless  . Rosuvastatin     REACTION: achy  . Penicillins Hives    Has patient had a PCN reaction causing immediate rash, facial/tongue/throat swelling, SOB or lightheadedness with hypotension: Yes Has patient had a PCN reaction causing severe rash involving mucus membranes or skin necrosis: Yes Has patient had a PCN reaction that required hospitalization No Has patient had a PCN reaction occurring within the last 10 years: No If all of the above answers are "NO", then may proceed with Cephalosporin use.     Patient Measurements: Height: 6\' 1"  (185.4 cm) Weight: 96.3 kg (212 lb 6.4 oz) IBW/kg (Calculated) : 79.9 Heparin Dosing Weight: 96.3 kg  Vital Signs: Temp: 99.2 F (37.3 C) (05/12 1549) Temp Source: Oral (05/12 1549) BP: 134/77 (05/12 1549) Pulse Rate: 80 (05/12 1549)  Labs: Recent Labs    07/05/19 1222 07/05/19 1426 07/06/19 0642 07/06/19 0943 07/06/19 1632  HGB 14.8  --  14.2  --   --   HCT 45.4  --  43.4  --   --   PLT 246  --  187  --   --   APTT  --   --   --  53*  --   LABPROT  --   --   --  13.3  --   INR  --   --   --  1.1  --   HEPARINUNFRC  --   --  0.20*  --  0.43  CREATININE 1.27*  --  1.15  --   --   TROPONINIHS 239* 555*  --   --   --     Estimated Creatinine Clearance: 77.3 mL/min (by C-G formula based on SCr of 1.15 mg/dL).   Assessment: 69 yr old male presenting with chest pain and  elevated  troponins was started on IV heparin.  Patient is S/P cath, with findings of severe 2-vessel disease requiring CVTS evaluation for CABG (CABG scheduled for 5/13).  Heparin level ~6 hrs after heparin infusion was increased to 1400 units/hr is 0.43 units/ml, which is within the goal range for this pt. CBC WNL. Per RN, no issues with IV or bleeding observed.  Goal of Therapy:  Heparin level 0.3-0.7 units/ml Monitor platelets by anticoagulation protocol: Yes   Plan:  Continue heparin infusion at 1400 units/hr (will not order confirmatory heparin level since Dr. Roxy Manns wants heparin stopped at midnight for procedure tomorrow, per stop time in Epic and RN) Monitor daily heparin level, CBC Monitor for signs/symptoms of bleeding  Gillermina Hu, PharmD, BCPS, [facility]  Clinical Pharmacist 07/06/2019,7:01 PM

## 2019-07-06 NOTE — Plan of Care (Signed)
  Problem: Pain Managment: Goal: General experience of comfort will improve Outcome: Progressing   

## 2019-07-07 ENCOUNTER — Inpatient Hospital Stay (HOSPITAL_COMMUNITY): Payer: Managed Care, Other (non HMO) | Admitting: Certified Registered Nurse Anesthetist

## 2019-07-07 ENCOUNTER — Inpatient Hospital Stay (HOSPITAL_COMMUNITY): Payer: Managed Care, Other (non HMO)

## 2019-07-07 ENCOUNTER — Inpatient Hospital Stay (HOSPITAL_COMMUNITY)
Admission: EM | Disposition: A | Discharge status: Home / Self Care | Payer: Self-pay | Source: Home / Self Care | Attending: Thoracic Surgery (Cardiothoracic Vascular Surgery)

## 2019-07-07 DIAGNOSIS — Z951 Presence of aortocoronary bypass graft: Secondary | ICD-10-CM

## 2019-07-07 HISTORY — PX: ENDOVEIN HARVEST OF GREATER SAPHENOUS VEIN: SHX5059

## 2019-07-07 HISTORY — PX: CORONARY ARTERY BYPASS GRAFT: SHX141

## 2019-07-07 HISTORY — PX: TEE WITHOUT CARDIOVERSION: SHX5443

## 2019-07-07 LAB — POCT I-STAT 7, (LYTES, BLD GAS, ICA,H+H)
Acid-Base Excess: 0 mmol/L (ref 0.0–2.0)
Acid-base deficit: 1 mmol/L (ref 0.0–2.0)
Acid-base deficit: 1 mmol/L (ref 0.0–2.0)
Acid-base deficit: 4 mmol/L — ABNORMAL HIGH (ref 0.0–2.0)
Acid-base deficit: 5 mmol/L — ABNORMAL HIGH (ref 0.0–2.0)
Bicarbonate: 25.7 mmol/L (ref 20.0–28.0)
Bicarbonate: 25.5 mmol/L (ref 20.0–28.0)
Bicarbonate: 23.3 mmol/L (ref 20.0–28.0)
Bicarbonate: 22.2 mmol/L (ref 20.0–28.0)
Bicarbonate: 21.2 mmol/L (ref 20.0–28.0)
Calcium, Ion: 1.27 mmol/L (ref 1.15–1.40)
Calcium, Ion: 1.11 mmol/L — ABNORMAL LOW (ref 1.15–1.40)
Calcium, Ion: 1.1 mmol/L — ABNORMAL LOW (ref 1.15–1.40)
Calcium, Ion: 1.11 mmol/L — ABNORMAL LOW (ref 1.15–1.40)
Calcium, Ion: 1.15 mmol/L (ref 1.15–1.40)
HCT: 41 % (ref 39.0–52.0)
HCT: 31 % — ABNORMAL LOW (ref 39.0–52.0)
HCT: 32 % — ABNORMAL LOW (ref 39.0–52.0)
HCT: 34 % — ABNORMAL LOW (ref 39.0–52.0)
HCT: 32 % — ABNORMAL LOW (ref 39.0–52.0)
Hemoglobin: 13.9 g/dL (ref 13.0–17.0)
Hemoglobin: 10.5 g/dL — ABNORMAL LOW (ref 13.0–17.0)
Hemoglobin: 10.9 g/dL — ABNORMAL LOW (ref 13.0–17.0)
Hemoglobin: 11.6 g/dL — ABNORMAL LOW (ref 13.0–17.0)
Hemoglobin: 10.9 g/dL — ABNORMAL LOW (ref 13.0–17.0)
O2 Saturation: 100 %
O2 Saturation: 100 %
O2 Saturation: 100 %
O2 Saturation: 99 %
O2 Saturation: 99 %
Patient temperature: 98.4
Patient temperature: 97.9
Potassium: 3.7 mmol/L (ref 3.5–5.1)
Potassium: 6.3 mmol/L (ref 3.5–5.1)
Potassium: 5.8 mmol/L — ABNORMAL HIGH (ref 3.5–5.1)
Potassium: 4.5 mmol/L (ref 3.5–5.1)
Potassium: 4.3 mmol/L (ref 3.5–5.1)
Sodium: 139 mmol/L (ref 135–145)
Sodium: 138 mmol/L (ref 135–145)
Sodium: 136 mmol/L (ref 135–145)
Sodium: 140 mmol/L (ref 135–145)
Sodium: 140 mmol/L (ref 135–145)
TCO2: 27 mmol/L (ref 22–32)
TCO2: 27 mmol/L (ref 22–32)
TCO2: 24 mmol/L (ref 22–32)
TCO2: 23 mmol/L (ref 22–32)
TCO2: 22 mmol/L (ref 22–32)
pCO2 arterial: 47.5 mmHg (ref 32.0–48.0)
pCO2 arterial: 45.1 mmHg (ref 32.0–48.0)
pCO2 arterial: 37.8 mmHg (ref 32.0–48.0)
pCO2 arterial: 41.8 mmHg (ref 32.0–48.0)
pCO2 arterial: 40.5 mmHg (ref 32.0–48.0)
pH, Arterial: 7.341 — ABNORMAL LOW (ref 7.350–7.450)
pH, Arterial: 7.36 (ref 7.350–7.450)
pH, Arterial: 7.399 (ref 7.350–7.450)
pH, Arterial: 7.333 — ABNORMAL LOW (ref 7.350–7.450)
pH, Arterial: 7.324 — ABNORMAL LOW (ref 7.350–7.450)
pO2, Arterial: 585 mmHg — ABNORMAL HIGH (ref 83.0–108.0)
pO2, Arterial: 343 mmHg — ABNORMAL HIGH (ref 83.0–108.0)
pO2, Arterial: 296 mmHg — ABNORMAL HIGH (ref 83.0–108.0)
pO2, Arterial: 176 mmHg — ABNORMAL HIGH (ref 83.0–108.0)
pO2, Arterial: 135 mmHg — ABNORMAL HIGH (ref 83.0–108.0)

## 2019-07-07 LAB — GLUCOSE, CAPILLARY
Glucose-Capillary: 103 mg/dL — ABNORMAL HIGH (ref 70–99)
Glucose-Capillary: 115 mg/dL — ABNORMAL HIGH (ref 70–99)
Glucose-Capillary: 86 mg/dL (ref 70–99)
Glucose-Capillary: 140 mg/dL — ABNORMAL HIGH (ref 70–99)
Glucose-Capillary: 124 mg/dL — ABNORMAL HIGH (ref 70–99)
Glucose-Capillary: 139 mg/dL — ABNORMAL HIGH (ref 70–99)
Glucose-Capillary: 129 mg/dL — ABNORMAL HIGH (ref 70–99)

## 2019-07-07 LAB — BASIC METABOLIC PANEL
Anion gap: 7 (ref 5–15)
Anion gap: 9 (ref 5–15)
BUN: 19 mg/dL (ref 8–23)
BUN: 17 mg/dL (ref 8–23)
CO2: 26 mmol/L (ref 22–32)
CO2: 21 mmol/L — ABNORMAL LOW (ref 22–32)
Calcium: 9.3 mg/dL (ref 8.9–10.3)
Calcium: 7.9 mg/dL — ABNORMAL LOW (ref 8.9–10.3)
Chloride: 106 mmol/L (ref 98–111)
Chloride: 107 mmol/L (ref 98–111)
Creatinine, Ser: 1.2 mg/dL (ref 0.61–1.24)
Creatinine, Ser: 0.96 mg/dL (ref 0.61–1.24)
GFR calc Af Amer: >60 mL/min (ref >60)
GFR calc Af Amer: >60 mL/min (ref >60)
GFR calc non Af Amer: >60 mL/min (ref >60)
GFR calc non Af Amer: >60 mL/min (ref >60)
Glucose, Bld: 116 mg/dL — ABNORMAL HIGH (ref 70–99)
Glucose, Bld: 137 mg/dL — ABNORMAL HIGH (ref 70–99)
Potassium: 4.3 mmol/L (ref 3.5–5.1)
Potassium: 4.6 mmol/L (ref 3.5–5.1)
Sodium: 139 mmol/L (ref 135–145)
Sodium: 137 mmol/L (ref 135–145)

## 2019-07-07 LAB — POCT I-STAT, CHEM 8
BUN: 19 mg/dL (ref 8–23)
BUN: 17 mg/dL (ref 8–23)
BUN: 17 mg/dL (ref 8–23)
BUN: 17 mg/dL (ref 8–23)
BUN: 18 mg/dL (ref 8–23)
BUN: 18 mg/dL (ref 8–23)
BUN: 17 mg/dL (ref 8–23)
Calcium, Ion: 1.18 mmol/L (ref 1.15–1.40)
Calcium, Ion: 1.16 mmol/L (ref 1.15–1.40)
Calcium, Ion: 1.26 mmol/L (ref 1.15–1.40)
Calcium, Ion: 1.06 mmol/L — ABNORMAL LOW (ref 1.15–1.40)
Calcium, Ion: 1.29 mmol/L (ref 1.15–1.40)
Calcium, Ion: 1.16 mmol/L (ref 1.15–1.40)
Calcium, Ion: 1.08 mmol/L — ABNORMAL LOW (ref 1.15–1.40)
Chloride: 105 mmol/L (ref 98–111)
Chloride: 108 mmol/L (ref 98–111)
Chloride: 107 mmol/L (ref 98–111)
Chloride: 104 mmol/L (ref 98–111)
Chloride: 106 mmol/L (ref 98–111)
Chloride: 105 mmol/L (ref 98–111)
Chloride: 105 mmol/L (ref 98–111)
Creatinine, Ser: 0.8 mg/dL (ref 0.61–1.24)
Creatinine, Ser: 0.8 mg/dL (ref 0.61–1.24)
Creatinine, Ser: 0.8 mg/dL (ref 0.61–1.24)
Creatinine, Ser: 0.8 mg/dL (ref 0.61–1.24)
Creatinine, Ser: 0.8 mg/dL (ref 0.61–1.24)
Creatinine, Ser: 0.8 mg/dL (ref 0.61–1.24)
Creatinine, Ser: 0.8 mg/dL (ref 0.61–1.24)
Glucose, Bld: 157 mg/dL — ABNORMAL HIGH (ref 70–99)
Glucose, Bld: 106 mg/dL — ABNORMAL HIGH (ref 70–99)
Glucose, Bld: 98 mg/dL (ref 70–99)
Glucose, Bld: 103 mg/dL — ABNORMAL HIGH (ref 70–99)
Glucose, Bld: 104 mg/dL — ABNORMAL HIGH (ref 70–99)
Glucose, Bld: 130 mg/dL — ABNORMAL HIGH (ref 70–99)
Glucose, Bld: 115 mg/dL — ABNORMAL HIGH (ref 70–99)
HCT: 41 % (ref 39.0–52.0)
HCT: 37 % — ABNORMAL LOW (ref 39.0–52.0)
HCT: 37 % — ABNORMAL LOW (ref 39.0–52.0)
HCT: 32 % — ABNORMAL LOW (ref 39.0–52.0)
HCT: 39 % (ref 39.0–52.0)
HCT: 34 % — ABNORMAL LOW (ref 39.0–52.0)
HCT: 32 % — ABNORMAL LOW (ref 39.0–52.0)
Hemoglobin: 13.9 g/dL (ref 13.0–17.0)
Hemoglobin: 12.6 g/dL — ABNORMAL LOW (ref 13.0–17.0)
Hemoglobin: 12.6 g/dL — ABNORMAL LOW (ref 13.0–17.0)
Hemoglobin: 10.9 g/dL — ABNORMAL LOW (ref 13.0–17.0)
Hemoglobin: 13.3 g/dL (ref 13.0–17.0)
Hemoglobin: 11.6 g/dL — ABNORMAL LOW (ref 13.0–17.0)
Hemoglobin: 10.9 g/dL — ABNORMAL LOW (ref 13.0–17.0)
Potassium: 3.7 mmol/L (ref 3.5–5.1)
Potassium: 3.8 mmol/L (ref 3.5–5.1)
Potassium: 4 mmol/L (ref 3.5–5.1)
Potassium: 4.4 mmol/L (ref 3.5–5.1)
Potassium: 6.4 mmol/L (ref 3.5–5.1)
Potassium: 5.6 mmol/L — ABNORMAL HIGH (ref 3.5–5.1)
Potassium: 5.2 mmol/L — ABNORMAL HIGH (ref 3.5–5.1)
Sodium: 140 mmol/L (ref 135–145)
Sodium: 142 mmol/L (ref 135–145)
Sodium: 142 mmol/L (ref 135–145)
Sodium: 138 mmol/L (ref 135–145)
Sodium: 140 mmol/L (ref 135–145)
Sodium: 136 mmol/L (ref 135–145)
Sodium: 137 mmol/L (ref 135–145)
TCO2: 27 mmol/L (ref 22–32)
TCO2: 23 mmol/L (ref 22–32)
TCO2: 24 mmol/L (ref 22–32)
TCO2: 25 mmol/L (ref 22–32)
TCO2: 26 mmol/L (ref 22–32)
TCO2: 26 mmol/L (ref 22–32)
TCO2: 27 mmol/L (ref 22–32)

## 2019-07-07 LAB — CBC
HCT: 43.9 % (ref 39.0–52.0)
HCT: 37.1 % — ABNORMAL LOW (ref 39.0–52.0)
HCT: 34.6 % — ABNORMAL LOW (ref 39.0–52.0)
Hemoglobin: 14.3 g/dL (ref 13.0–17.0)
Hemoglobin: 12.5 g/dL — ABNORMAL LOW (ref 13.0–17.0)
Hemoglobin: 11.5 g/dL — ABNORMAL LOW (ref 13.0–17.0)
MCH: 28.6 pg (ref 26.0–34.0)
MCH: 29.6 pg (ref 26.0–34.0)
MCH: 29.2 pg (ref 26.0–34.0)
MCHC: 32.6 g/dL (ref 30.0–36.0)
MCHC: 33.7 g/dL (ref 30.0–36.0)
MCHC: 33.2 g/dL (ref 30.0–36.0)
MCV: 87.8 fL (ref 80.0–100.0)
MCV: 87.7 fL (ref 80.0–100.0)
MCV: 87.8 fL (ref 80.0–100.0)
Platelets: 183 10*3/uL (ref 150–400)
Platelets: 171 10*3/uL (ref 150–400)
Platelets: 130 10*3/uL — ABNORMAL LOW (ref 150–400)
RBC: 5 MIL/uL (ref 4.22–5.81)
RBC: 4.23 MIL/uL (ref 4.22–5.81)
RBC: 3.94 MIL/uL — ABNORMAL LOW (ref 4.22–5.81)
RDW: 13.8 % (ref 11.5–15.5)
RDW: 13.9 % (ref 11.5–15.5)
RDW: 13.8 % (ref 11.5–15.5)
WBC: 5.5 10*3/uL (ref 4.0–10.5)
WBC: 14 10*3/uL — ABNORMAL HIGH (ref 4.0–10.5)
WBC: 11.4 10*3/uL — ABNORMAL HIGH (ref 4.0–10.5)
nRBC: 0 % (ref 0.0–0.2)
nRBC: 0 % (ref 0.0–0.2)
nRBC: 0 % (ref 0.0–0.2)

## 2019-07-07 LAB — HEMOGLOBIN AND HEMATOCRIT, BLOOD
HCT: 34.7 % — ABNORMAL LOW (ref 39.0–52.0)
Hemoglobin: 11.4 g/dL — ABNORMAL LOW (ref 13.0–17.0)

## 2019-07-07 LAB — ECHO INTRAOPERATIVE TEE
Height: 73 in
Weight: 3398.4 oz

## 2019-07-07 LAB — TROPONIN I (HIGH SENSITIVITY): Troponin I (High Sensitivity): 2025 ng/L (ref <18)

## 2019-07-07 LAB — PROTIME-INR
INR: 1.2 (ref 0.8–1.2)
Prothrombin Time: 14.9 seconds (ref 11.4–15.2)

## 2019-07-07 LAB — APTT: aPTT: 32 seconds (ref 24–36)

## 2019-07-07 LAB — MAGNESIUM: Magnesium: 2.2 mg/dL (ref 1.7–2.4)

## 2019-07-07 LAB — PLATELET COUNT: Platelets: 169 10*3/uL (ref 150–400)

## 2019-07-07 SURGERY — CORONARY ARTERY BYPASS GRAFTING (CABG)
Anesthesia: General | Site: Leg Upper | Laterality: Right

## 2019-07-07 MED ORDER — LACTATED RINGERS IV SOLN: INTRAVENOUS | Status: DC

## 2019-07-07 MED ORDER — ACETAMINOPHEN 650 MG RE SUPP
650.0000 mg | Freq: Once | RECTAL | Status: AC
  Administered 2019-07-07: 650 mg via RECTAL

## 2019-07-07 MED ORDER — HEPARIN SODIUM (PORCINE) 1000 UNIT/ML IJ SOLN
INTRAMUSCULAR | Status: DC | PRN
  Administered 2019-07-07: 29000 [IU] via INTRAVENOUS
  Administered 2019-07-07: 5000 [IU] via INTRAVENOUS
  Administered 2019-07-07: 29000 [IU] via INTRAVENOUS

## 2019-07-07 MED ORDER — SODIUM CHLORIDE 0.9% FLUSH
10.0000 mL | Freq: Two times a day (BID) | INTRAVENOUS | Status: DC
  Administered 2019-07-07 (×2): 10 mL
  Administered 2019-07-08: 30 mL
  Administered 2019-07-08 – 2019-07-09 (×2): 10 mL

## 2019-07-07 MED ORDER — MORPHINE SULFATE (PF) 2 MG/ML IV SOLN
1.0000 mg | INTRAVENOUS | Status: DC | PRN
  Administered 2019-07-07 – 2019-07-08 (×3): 2 mg via INTRAVENOUS
  Filled 2019-07-07 (×4): qty 1

## 2019-07-07 MED ORDER — SODIUM CHLORIDE 0.9 % IV SOLN: INTRAVENOUS | Status: DC

## 2019-07-07 MED ORDER — HEPARIN SODIUM (PORCINE) 1000 UNIT/ML IJ SOLN
INTRAMUSCULAR | Status: AC
  Filled 2019-07-07: qty 1

## 2019-07-07 MED ORDER — FENTANYL CITRATE (PF) 250 MCG/5ML IJ SOLN
INTRAMUSCULAR | Status: DC | PRN
  Administered 2019-07-07 (×3): 150 ug via INTRAVENOUS
  Administered 2019-07-07 (×3): 100 ug via INTRAVENOUS
  Administered 2019-07-07: 50 ug via INTRAVENOUS
  Administered 2019-07-07: 100 ug via INTRAVENOUS
  Administered 2019-07-07: 150 ug via INTRAVENOUS
  Administered 2019-07-07 (×2): 100 ug via INTRAVENOUS

## 2019-07-07 MED ORDER — METOPROLOL TARTRATE 5 MG/5ML IV SOLN: 2.5000 mg | INTRAVENOUS | Status: DC | PRN

## 2019-07-07 MED ORDER — VANCOMYCIN HCL IN DEXTROSE 1-5 GM/200ML-% IV SOLN
1000.0000 mg | Freq: Once | INTRAVENOUS | Status: AC
  Administered 2019-07-07: 1000 mg via INTRAVENOUS
  Filled 2019-07-07: qty 200

## 2019-07-07 MED ORDER — DEXTROSE 50 % IV SOLN: 0.0000 mL | INTRAVENOUS | Status: DC | PRN

## 2019-07-07 MED ORDER — MIDAZOLAM HCL 2 MG/2ML IJ SOLN
INTRAMUSCULAR | Status: AC
  Filled 2019-07-07: qty 2

## 2019-07-07 MED ORDER — ASPIRIN 81 MG PO CHEW: 324.0000 mg | CHEWABLE_TABLET | Freq: Every day | ORAL | Status: DC

## 2019-07-07 MED ORDER — CHLORHEXIDINE GLUCONATE 0.12 % MT SOLN
OROMUCOSAL | Status: AC
  Administered 2019-07-07: 15 mL via OROMUCOSAL
  Filled 2019-07-07: qty 15

## 2019-07-07 MED ORDER — METOPROLOL TARTRATE 25 MG/10 ML ORAL SUSPENSION: 12.5000 mg | Freq: Two times a day (BID) | ORAL | Status: DC

## 2019-07-07 MED ORDER — ROCURONIUM BROMIDE 10 MG/ML (PF) SYRINGE
PREFILLED_SYRINGE | INTRAVENOUS | Status: DC | PRN
  Administered 2019-07-07: 100 mg via INTRAVENOUS
  Administered 2019-07-07: 50 mg via INTRAVENOUS
  Administered 2019-07-07: 30 mg via INTRAVENOUS
  Administered 2019-07-07 (×3): 50 mg via INTRAVENOUS

## 2019-07-07 MED ORDER — SODIUM CHLORIDE (PF) 0.9 % IJ SOLN
INTRAMUSCULAR | Status: AC
  Filled 2019-07-07: qty 10

## 2019-07-07 MED ORDER — LACTATED RINGERS IV SOLN: INTRAVENOUS | Status: DC | PRN

## 2019-07-07 MED ORDER — BISACODYL 5 MG PO TBEC
10.0000 mg | DELAYED_RELEASE_TABLET | Freq: Every day | ORAL | Status: DC
  Administered 2019-07-08 – 2019-07-10 (×3): 10 mg via ORAL
  Filled 2019-07-07 (×3): qty 2

## 2019-07-07 MED ORDER — SODIUM CHLORIDE 0.9 % IV SOLN: 250.0000 mL | INTRAVENOUS | Status: DC

## 2019-07-07 MED ORDER — VANCOMYCIN HCL 1000 MG IV SOLR
INTRAVENOUS | Status: DC | PRN
  Administered 2019-07-07: 1000 mL

## 2019-07-07 MED ORDER — FAMOTIDINE IN NACL 20-0.9 MG/50ML-% IV SOLN: 20.0000 mg | Freq: Two times a day (BID) | INTRAVENOUS | Status: DC

## 2019-07-07 MED ORDER — SODIUM CHLORIDE 0.9 % IV SOLN: INTRAVENOUS | Status: DC | PRN

## 2019-07-07 MED ORDER — MIDAZOLAM HCL 2 MG/2ML IJ SOLN: 2.0000 mg | INTRAMUSCULAR | Status: DC | PRN

## 2019-07-07 MED ORDER — CHLORHEXIDINE GLUCONATE 0.12 % MT SOLN: 15.0000 mL | OROMUCOSAL | Status: AC

## 2019-07-07 MED ORDER — LACTATED RINGERS IV SOLN
INTRAVENOUS | Status: DC | PRN
Start: 2019-07-07 — End: 2019-07-07

## 2019-07-07 MED ORDER — ORAL CARE MOUTH RINSE
15.0000 mL | Freq: Two times a day (BID) | OROMUCOSAL | Status: DC
  Administered 2019-07-07 – 2019-07-11 (×5): 15 mL via OROMUCOSAL

## 2019-07-07 MED ORDER — METOPROLOL TARTRATE 12.5 MG HALF TABLET
12.5000 mg | ORAL_TABLET | Freq: Two times a day (BID) | ORAL | Status: DC
  Administered 2019-07-07 – 2019-07-08 (×3): 12.5 mg via ORAL
  Filled 2019-07-07 (×3): qty 1

## 2019-07-07 MED ORDER — CHLORHEXIDINE GLUCONATE CLOTH 2 % EX PADS
6.0000 | MEDICATED_PAD | Freq: Every day | CUTANEOUS | Status: DC
  Administered 2019-07-08: 6 via TOPICAL

## 2019-07-07 MED ORDER — DEXMEDETOMIDINE HCL IN NACL 400 MCG/100ML IV SOLN
0.0000 ug/kg/h | INTRAVENOUS | Status: DC
  Administered 2019-07-07: 0.2 ug/kg/h via INTRAVENOUS
  Filled 2019-07-07: qty 100

## 2019-07-07 MED ORDER — SODIUM CHLORIDE 0.9% FLUSH: 10.0000 mL | INTRAVENOUS | Status: DC | PRN

## 2019-07-07 MED ORDER — ACETAMINOPHEN 160 MG/5ML PO SOLN: 650.0000 mg | Freq: Once | ORAL | Status: AC

## 2019-07-07 MED ORDER — ROCURONIUM BROMIDE 10 MG/ML (PF) SYRINGE
PREFILLED_SYRINGE | INTRAVENOUS | Status: AC
  Filled 2019-07-07: qty 10

## 2019-07-07 MED ORDER — PROPOFOL 10 MG/ML IV BOLUS
INTRAVENOUS | Status: DC | PRN
  Administered 2019-07-07: 30 mg via INTRAVENOUS
  Administered 2019-07-07: 40 mg via INTRAVENOUS

## 2019-07-07 MED ORDER — OXYCODONE HCL 5 MG PO TABS
5.0000 mg | ORAL_TABLET | ORAL | Status: DC | PRN
  Administered 2019-07-07 – 2019-07-09 (×7): 10 mg via ORAL
  Filled 2019-07-07 (×7): qty 2

## 2019-07-07 MED ORDER — PLASMA-LYTE 148 IV SOLN
INTRAVENOUS | Status: DC | PRN
  Administered 2019-07-07: 500 mL

## 2019-07-07 MED ORDER — PROTAMINE SULFATE 10 MG/ML IV SOLN
INTRAVENOUS | Status: AC
  Filled 2019-07-07: qty 5

## 2019-07-07 MED ORDER — MIDAZOLAM HCL (PF) 10 MG/2ML IJ SOLN
INTRAMUSCULAR | Status: AC
  Filled 2019-07-07: qty 2

## 2019-07-07 MED ORDER — PHENYLEPHRINE HCL-NACL 20-0.9 MG/250ML-% IV SOLN
0.0000 ug/min | INTRAVENOUS | Status: DC
  Administered 2019-07-08: 10 ug/min via INTRAVENOUS
  Filled 2019-07-07 (×2): qty 250

## 2019-07-07 MED ORDER — CHLORHEXIDINE GLUCONATE 0.12% ORAL RINSE (MEDLINE KIT): 15.0000 mL | Freq: Two times a day (BID) | OROMUCOSAL | Status: DC

## 2019-07-07 MED ORDER — ORAL CARE MOUTH RINSE: 15.0000 mL | OROMUCOSAL | Status: DC

## 2019-07-07 MED ORDER — BISACODYL 10 MG RE SUPP: 10.0000 mg | Freq: Every day | RECTAL | Status: DC

## 2019-07-07 MED ORDER — LACTATED RINGERS IV SOLN: 500.0000 mL | Freq: Once | INTRAVENOUS | Status: DC | PRN

## 2019-07-07 MED ORDER — SODIUM CHLORIDE 0.9 % IV SOLN
1.5000 g | Freq: Two times a day (BID) | INTRAVENOUS | Status: AC
  Administered 2019-07-07 – 2019-07-09 (×4): 1.5 g via INTRAVENOUS
  Filled 2019-07-07 (×4): qty 1.5

## 2019-07-07 MED ORDER — DOCUSATE SODIUM 100 MG PO CAPS
200.0000 mg | ORAL_CAPSULE | Freq: Every day | ORAL | Status: DC
  Administered 2019-07-08 – 2019-07-10 (×3): 200 mg via ORAL
  Filled 2019-07-07 (×3): qty 2

## 2019-07-07 MED ORDER — PHENYLEPHRINE 40 MCG/ML (10ML) SYRINGE FOR IV PUSH (FOR BLOOD PRESSURE SUPPORT)
PREFILLED_SYRINGE | INTRAVENOUS | Status: AC
  Filled 2019-07-07: qty 10

## 2019-07-07 MED ORDER — ASPIRIN EC 325 MG PO TBEC: 325.0000 mg | DELAYED_RELEASE_TABLET | Freq: Every day | ORAL | Status: DC

## 2019-07-07 MED ORDER — HEMOSTATIC AGENTS (NO CHARGE) OPTIME
TOPICAL | Status: DC | PRN
  Administered 2019-07-07 (×3): 1 via TOPICAL

## 2019-07-07 MED ORDER — ALBUMIN HUMAN 5 % IV SOLN
250.0000 mL | INTRAVENOUS | Status: AC | PRN
  Administered 2019-07-07 – 2019-07-08 (×4): 12.5 g via INTRAVENOUS
  Filled 2019-07-07 (×2): qty 250

## 2019-07-07 MED ORDER — TRAMADOL HCL 50 MG PO TABS
50.0000 mg | ORAL_TABLET | ORAL | Status: DC | PRN
  Administered 2019-07-08 (×2): 100 mg via ORAL
  Filled 2019-07-07 (×2): qty 2

## 2019-07-07 MED ORDER — PHENYLEPHRINE 40 MCG/ML (10ML) SYRINGE FOR IV PUSH (FOR BLOOD PRESSURE SUPPORT)
PREFILLED_SYRINGE | INTRAVENOUS | Status: DC | PRN
  Administered 2019-07-07: 80 ug via INTRAVENOUS
  Administered 2019-07-07: 40 ug via INTRAVENOUS
  Administered 2019-07-07: 80 ug via INTRAVENOUS
  Administered 2019-07-07 (×4): 40 ug via INTRAVENOUS
  Administered 2019-07-07: 120 ug via INTRAVENOUS
  Administered 2019-07-07: 80 ug via INTRAVENOUS
  Administered 2019-07-07: 120 ug via INTRAVENOUS
  Administered 2019-07-07 (×3): 40 ug via INTRAVENOUS

## 2019-07-07 MED ORDER — MAGNESIUM SULFATE 4 GM/100ML IV SOLN: 4.0000 g | Freq: Once | INTRAVENOUS | Status: AC

## 2019-07-07 MED ORDER — PROPOFOL 10 MG/ML IV BOLUS
INTRAVENOUS | Status: AC
  Filled 2019-07-07: qty 20

## 2019-07-07 MED ORDER — MAGNESIUM SULFATE 4 GM/100ML IV SOLN
INTRAVENOUS | Status: AC
  Administered 2019-07-07: 4 g via INTRAVENOUS
  Filled 2019-07-07: qty 100

## 2019-07-07 MED ORDER — ACETAMINOPHEN 500 MG PO TABS
1000.0000 mg | ORAL_TABLET | Freq: Four times a day (QID) | ORAL | Status: DC
  Administered 2019-07-07 – 2019-07-11 (×13): 1000 mg via ORAL
  Filled 2019-07-07 (×13): qty 2

## 2019-07-07 MED ORDER — ONDANSETRON HCL 4 MG/2ML IJ SOLN
4.0000 mg | Freq: Four times a day (QID) | INTRAMUSCULAR | Status: DC | PRN
  Administered 2019-07-08 – 2019-07-09 (×3): 4 mg via INTRAVENOUS
  Filled 2019-07-07 (×3): qty 2

## 2019-07-07 MED ORDER — PROTAMINE SULFATE 10 MG/ML IV SOLN
INTRAVENOUS | Status: AC
  Filled 2019-07-07: qty 25

## 2019-07-07 MED ORDER — INSULIN REGULAR(HUMAN) IN NACL 100-0.9 UT/100ML-% IV SOLN: INTRAVENOUS | Status: DC

## 2019-07-07 MED ORDER — PROTAMINE SULFATE 10 MG/ML IV SOLN
INTRAVENOUS | Status: DC | PRN
  Administered 2019-07-07: 10 mg via INTRAVENOUS
  Administered 2019-07-07: 240 mg via INTRAVENOUS

## 2019-07-07 MED ORDER — FENTANYL CITRATE (PF) 250 MCG/5ML IJ SOLN
INTRAMUSCULAR | Status: AC
  Filled 2019-07-07: qty 25

## 2019-07-07 MED ORDER — SODIUM CHLORIDE 0.45 % IV SOLN: INTRAVENOUS | Status: DC | PRN

## 2019-07-07 MED ORDER — POTASSIUM CHLORIDE 10 MEQ/50ML IV SOLN: 10.0000 meq | INTRAVENOUS | Status: AC

## 2019-07-07 MED ORDER — FAMOTIDINE IN NACL 20-0.9 MG/50ML-% IV SOLN
INTRAVENOUS | Status: AC
  Administered 2019-07-07: 20 mg via INTRAVENOUS
  Filled 2019-07-07: qty 50

## 2019-07-07 MED ORDER — SODIUM CHLORIDE 0.9% FLUSH
3.0000 mL | Freq: Two times a day (BID) | INTRAVENOUS | Status: DC
  Administered 2019-07-08 – 2019-07-11 (×7): 3 mL via INTRAVENOUS

## 2019-07-07 MED ORDER — MIDAZOLAM HCL 5 MG/5ML IJ SOLN
INTRAMUSCULAR | Status: DC | PRN
  Administered 2019-07-07 (×3): 2 mg via INTRAVENOUS
  Administered 2019-07-07: 4 mg via INTRAVENOUS
  Administered 2019-07-07: 2 mg via INTRAVENOUS

## 2019-07-07 MED ORDER — ROCURONIUM BROMIDE 10 MG/ML (PF) SYRINGE
PREFILLED_SYRINGE | INTRAVENOUS | Status: AC
  Filled 2019-07-07: qty 20

## 2019-07-07 MED ORDER — NITROGLYCERIN IN D5W 200-5 MCG/ML-% IV SOLN: 0.0000 ug/min | INTRAVENOUS | Status: DC

## 2019-07-07 MED ORDER — SODIUM CHLORIDE 0.9% FLUSH: 3.0000 mL | INTRAVENOUS | Status: DC | PRN

## 2019-07-07 MED ORDER — ACETAMINOPHEN 160 MG/5ML PO SOLN: 1000.0000 mg | Freq: Four times a day (QID) | ORAL | Status: DC

## 2019-07-07 SURGICAL SUPPLY — 116 items
BAG DECANTER FOR FLEXI CONT (MISCELLANEOUS) ×8 IMPLANT
BLADE CLIPPER SURG (BLADE) ×5 IMPLANT
BLADE STERNUM SYSTEM 6 (BLADE) ×4 IMPLANT
BNDG ELASTIC 4X5.8 VLCR STR LF (GAUZE/BANDAGES/DRESSINGS) ×4 IMPLANT
BNDG ELASTIC 6X5.8 VLCR STR LF (GAUZE/BANDAGES/DRESSINGS) ×4 IMPLANT
BNDG GAUZE ELAST 4 BULKY (GAUZE/BANDAGES/DRESSINGS) ×4 IMPLANT
CANISTER SUCT 3000ML PPV (MISCELLANEOUS) ×4 IMPLANT
CANNULA EZ GLIDE AORTIC 21FR (CANNULA) ×7 IMPLANT
CATH CPB KIT OWEN (MISCELLANEOUS) ×4 IMPLANT
CATH THORACIC 36FR (CATHETERS) ×4 IMPLANT
CLIP RETRACTION 3.0MM CORONARY (MISCELLANEOUS) ×4 IMPLANT
CLIP VESOCCLUDE MED 24/CT (CLIP) IMPLANT
CLIP VESOCCLUDE SM WIDE 24/CT (CLIP) ×7 IMPLANT
CONN ST 1/4X3/8  BEN (MISCELLANEOUS) ×8
CONN ST 1/4X3/8 BEN (MISCELLANEOUS) IMPLANT
DEFOGGER ANTIFOG KIT (MISCELLANEOUS) ×1 IMPLANT
DERMABOND ADVANCED (GAUZE/BANDAGES/DRESSINGS) ×1
DERMABOND ADVANCED .7 DNX12 (GAUZE/BANDAGES/DRESSINGS) IMPLANT
DRAIN CHANNEL 32F RND 10.7 FF (WOUND CARE) ×9 IMPLANT
DRAPE CARDIOVASCULAR INCISE (DRAPES) ×8
DRAPE HALF SHEET 40X57 (DRAPES) ×1 IMPLANT
DRAPE INCISE IOBAN 66X45 STRL (DRAPES) ×3 IMPLANT
DRAPE SLUSH/WARMER DISC (DRAPES) ×4 IMPLANT
DRAPE SRG 135X102X78XABS (DRAPES) ×3 IMPLANT
DRSG AQUACEL AG ADV 3.5X14 (GAUZE/BANDAGES/DRESSINGS) ×4 IMPLANT
ELECT BLADE 4.0 EZ CLEAN MEGAD (MISCELLANEOUS) ×4
ELECT REM PT RETURN 9FT ADLT (ELECTROSURGICAL) ×8
ELECTRODE BLDE 4.0 EZ CLN MEGD (MISCELLANEOUS) ×3 IMPLANT
ELECTRODE REM PT RTRN 9FT ADLT (ELECTROSURGICAL) ×6 IMPLANT
FELT TEFLON 1X6 (MISCELLANEOUS) ×7 IMPLANT
GAUZE SPONGE 4X4 12PLY STRL (GAUZE/BANDAGES/DRESSINGS) ×8 IMPLANT
GAUZE SPONGE 4X4 12PLY STRL LF (GAUZE/BANDAGES/DRESSINGS) ×2 IMPLANT
GLOVE BIO SURGEON STRL SZ 6 (GLOVE) ×1 IMPLANT
GLOVE BIO SURGEON STRL SZ7.5 (GLOVE) ×1 IMPLANT
GLOVE BIOGEL PI IND STRL 6 (GLOVE) IMPLANT
GLOVE BIOGEL PI IND STRL 6.5 (GLOVE) IMPLANT
GLOVE BIOGEL PI IND STRL 7.5 (GLOVE) IMPLANT
GLOVE BIOGEL PI INDICATOR 6 (GLOVE) ×4
GLOVE BIOGEL PI INDICATOR 6.5 (GLOVE) ×4
GLOVE BIOGEL PI INDICATOR 7.5 (GLOVE) ×4
GLOVE ORTHO TXT STRL SZ7.5 (GLOVE) ×8 IMPLANT
GLOVE SURG SS PI 6.0 STRL IVOR (GLOVE) ×1 IMPLANT
GLOVE SURG SS PI 6.5 STRL IVOR (GLOVE) ×2 IMPLANT
GLOVE SURG SS PI 7.5 STRL IVOR (GLOVE) ×3 IMPLANT
GOWN STRL REUS W/ TWL LRG LVL3 (GOWN DISPOSABLE) ×12 IMPLANT
GOWN STRL REUS W/TWL LRG LVL3 (GOWN DISPOSABLE) ×36
HEMOSTAT POWDER SURGIFOAM 1G (HEMOSTASIS) ×12 IMPLANT
INSERT FOGARTY XLG (MISCELLANEOUS) ×4 IMPLANT
KIT BASIN OR (CUSTOM PROCEDURE TRAY) ×4 IMPLANT
KIT SUCTION CATH 14FR (SUCTIONS) ×12 IMPLANT
KIT TURNOVER KIT B (KITS) ×4 IMPLANT
KIT VASOVIEW HEMOPRO 2 VH 4000 (KITS) ×4 IMPLANT
LEAD PACING MYOCARDI (MISCELLANEOUS) ×4 IMPLANT
MARKER GRAFT CORONARY BYPASS (MISCELLANEOUS) ×12 IMPLANT
NS IRRIG 1000ML POUR BTL (IV SOLUTION) ×20 IMPLANT
PACK E OPEN HEART (SUTURE) ×4 IMPLANT
PACK OPEN HEART (CUSTOM PROCEDURE TRAY) ×4 IMPLANT
PAD ARMBOARD 7.5X6 YLW CONV (MISCELLANEOUS) ×8 IMPLANT
PAD ELECT DEFIB RADIOL ZOLL (MISCELLANEOUS) ×4 IMPLANT
PENCIL BUTTON HOLSTER BLD 10FT (ELECTRODE) ×4 IMPLANT
POSITIONER HEAD DONUT 9IN (MISCELLANEOUS) ×4 IMPLANT
PUNCH AORTIC ROTATE 4.0MM (MISCELLANEOUS) IMPLANT
PUNCH AORTIC ROTATE 4.5MM 8IN (MISCELLANEOUS) ×1 IMPLANT
PUNCH AORTIC ROTATE 5MM 8IN (MISCELLANEOUS) IMPLANT
SET CARDIOPLEGIA MPS 5001102 (MISCELLANEOUS) ×1 IMPLANT
SOL ANTI FOG 6CC (MISCELLANEOUS) IMPLANT
SOLUTION ANTI FOG 6CC (MISCELLANEOUS) ×1
SPONGE LAP 18X18 RF (DISPOSABLE) IMPLANT
SPONGE LAP 4X18 RFD (DISPOSABLE) IMPLANT
SUPPORT HEART JANKE-BARRON (MISCELLANEOUS) ×4 IMPLANT
SUT BONE WAX W31G (SUTURE) ×4 IMPLANT
SUT ETHIBOND NAB MH 2-0 36IN (SUTURE) ×1 IMPLANT
SUT ETHIBOND X763 2 0 SH 1 (SUTURE) ×8 IMPLANT
SUT MNCRL AB 3-0 PS2 18 (SUTURE) ×9 IMPLANT
SUT MNCRL AB 4-0 PS2 18 (SUTURE) IMPLANT
SUT PDS AB 1 CTX 36 (SUTURE) ×8 IMPLANT
SUT PROLENE 2 0 SH DA (SUTURE) IMPLANT
SUT PROLENE 3 0 SH DA (SUTURE) ×4 IMPLANT
SUT PROLENE 3 0 SH1 36 (SUTURE) IMPLANT
SUT PROLENE 4 0 RB 1 (SUTURE)
SUT PROLENE 4 0 SH DA (SUTURE) IMPLANT
SUT PROLENE 4-0 RB1 .5 CRCL 36 (SUTURE) IMPLANT
SUT PROLENE 5 0 C 1 36 (SUTURE) IMPLANT
SUT PROLENE 6 0 C 1 30 (SUTURE) ×2 IMPLANT
SUT PROLENE 7.0 RB 3 (SUTURE) ×12 IMPLANT
SUT PROLENE 8 0 BV175 6 (SUTURE) ×2 IMPLANT
SUT PROLENE BLUE 7 0 (SUTURE) ×4 IMPLANT
SUT PROLENE POLY MONO (SUTURE) IMPLANT
SUT SILK  1 MH (SUTURE) ×12
SUT SILK 1 MH (SUTURE) ×3 IMPLANT
SUT SILK 2 0 SH (SUTURE) ×1 IMPLANT
SUT STEEL 6MS V (SUTURE) ×1 IMPLANT
SUT STEEL STERNAL CCS#1 18IN (SUTURE) IMPLANT
SUT STEEL SZ 6 DBL 3X14 BALL (SUTURE) ×2 IMPLANT
SUT VIC AB 1 CTX 36 (SUTURE)
SUT VIC AB 1 CTX36XBRD ANBCTR (SUTURE) IMPLANT
SUT VIC AB 2-0 CT1 27 (SUTURE) ×4
SUT VIC AB 2-0 CT1 TAPERPNT 27 (SUTURE) IMPLANT
SUT VIC AB 2-0 CTX 27 (SUTURE) IMPLANT
SUT VIC AB 3-0 SH 27 (SUTURE)
SUT VIC AB 3-0 SH 27X BRD (SUTURE) IMPLANT
SUT VIC AB 3-0 X1 27 (SUTURE) IMPLANT
SUT VICRYL 4-0 PS2 18IN ABS (SUTURE) IMPLANT
SYSTEM SAHARA CHEST DRAIN ATS (WOUND CARE) ×5 IMPLANT
TAPE CLOTH SURG 4X10 WHT LF (GAUZE/BANDAGES/DRESSINGS) ×1 IMPLANT
TAPE PAPER 2X10 WHT MICROPORE (GAUZE/BANDAGES/DRESSINGS) ×1 IMPLANT
TAPES RETRACTO (MISCELLANEOUS) ×1 IMPLANT
TOWEL GREEN STERILE (TOWEL DISPOSABLE) ×5 IMPLANT
TOWEL GREEN STERILE FF (TOWEL DISPOSABLE) ×3 IMPLANT
TRAY FOL W/BAG SLVR 16FR STRL (SET/KITS/TRAYS/PACK) IMPLANT
TRAY FOLEY SLVR 16FR TEMP STAT (SET/KITS/TRAYS/PACK) ×3 IMPLANT
TRAY FOLEY W/BAG SLVR 16FR LF (SET/KITS/TRAYS/PACK) ×4
TUBE CONNECTING 20X1/4 (TUBING) ×1 IMPLANT
TUBING LAP HI FLOW INSUFFLATIO (TUBING) ×4 IMPLANT
UNDERPAD 30X36 HEAVY ABSORB (UNDERPADS AND DIAPERS) ×4 IMPLANT
WATER STERILE IRR 1000ML POUR (IV SOLUTION) ×8 IMPLANT

## 2019-07-07 NOTE — Anesthesia Postprocedure Evaluation (Signed)
Anesthesia Post Note  Patient: Corey [name]  Procedure(s) Performed: CORONARY ARTERY BYPASS GRAFTING (CABG) using LIMA to LAD; Free IMA to RCA. (N/A Chest) TRANSESOPHAGEAL ECHOCARDIOGRAM (TEE) (N/A ) Endovein Harvest Of Greater Saphenous Vein (Right Leg Upper)     Patient location during evaluation: ICU Anesthesia Type: General Level of consciousness: sedated and patient remains intubated per anesthesia plan Pain management: pain level controlled Vital Signs Assessment: post-procedure vital signs reviewed and stable Respiratory status: patient remains intubated per anesthesia plan Cardiovascular status: stable Postop Assessment: no apparent nausea or vomiting Anesthetic complications: no    Last Vitals:  Vitals:   07/07/19 1515 07/07/19 1530  BP: 95/73 112/80  Pulse: 86 86  Resp: 14 12  Temp:    SpO2: 99% 99%    Last Pain:  Vitals:   07/07/19 0513  TempSrc: Oral  PainSc:                  Audry Pili

## 2019-07-07 NOTE — Progress Notes (Signed)
      [address] [address]       [place] [address]             [phone]     CARDIOTHORACIC SURGERY PROGRESS NOTE  Subjective: Carder D Martinique has been scheduled for Procedure(s) with comments: CORONARY ARTERY BYPASS GRAFTING (CABG) (N/A) - BILATERAL IMA TRANSESOPHAGEAL ECHOCARDIOGRAM (TEE) (N/A) today.   Objective: Vital signs in last 24 hours: Temp:  [97.8 F (36.6 C)-99.2 F (37.3 C)] 98.5 F (36.9 C) (05/13 0513) Pulse Rate:  [76-91] 76 (05/13 0513) Cardiac Rhythm: Normal sinus rhythm (05/12 1923) Resp:  [14-20] 14 (05/13 0513) BP: (129-142)/(75-80) 142/78 (05/13 0513) SpO2:  [97 %-99 %] 98 % (05/13 0513)  Physical Exam: Unchanged from previously   Intake/Output from previous day: 05/12 0701 - 05/13 0700 In: 725.1 [P.O.:470; I.V.:255.1] Out: 751 [Urine:750; Stool:1] Intake/Output this shift: Total I/O In: 301.9 [P.O.:220; I.V.:81.9] Out: 1 [Stool:1]  Lab Results: Recent Labs    07/06/19 0642 07/07/19 0432  WBC 7.5 5.5  HGB 14.2 14.3  HCT 43.4 43.9  PLT 187 183   BMET:  Recent Labs    07/06/19 0642 07/07/19 0432  NA 138 139  K 4.6 4.3  CL 105 106  CO2 22 26  GLUCOSE 113* 116*  BUN 16 19  CREATININE 1.15 1.20  CALCIUM 8.9 9.3    CBG (last 3)  Recent Labs    07/06/19 1618 07/06/19 2139 07/07/19 0539  GLUCAP 119* 132* 103*   PT/INR:   Recent Labs    07/06/19 0943  LABPROT 13.3  INR 1.1    Assessment/Plan:   The various methods of treatment have been discussed with the patient. After consideration of the risks, benefits and treatment options the patient has consented to the planned procedure.   The patient has been seen and labs reviewed. There are no changes in the patient's condition to prevent proceeding with the planned procedure today.   Rexene Alberts, MD 07/07/2019 6:37 AM

## 2019-07-07 NOTE — Anesthesia Procedure Notes (Signed)
Central Venous Catheter Insertion Performed by: Catalina Gravel, MD, anesthesiologist Start/End5/13/2021 6:33 AM, 07/07/2019 6:43 AM Patient location: Pre-op. Preanesthetic checklist: patient identified, IV checked, site marked, risks and benefits discussed, surgical consent, monitors and equipment checked, pre-op evaluation, timeout performed and anesthesia consent Position: Trendelenburg Lidocaine 1% used for infiltration and patient sedated Hand hygiene performed , maximum sterile barriers used  and Seldinger technique used Catheter size: 8.5 Fr Central line was placed.Sheath introducer Procedure performed using ultrasound guided technique. Ultrasound Notes:anatomy identified, needle tip was noted to be adjacent to the nerve/plexus identified, no ultrasound evidence of intravascular and/or intraneural injection and image(s) printed for medical record Attempts: 1 Following insertion, line sutured, dressing applied and Biopatch. Post procedure assessment: free fluid flow, blood return through all ports and no air  Patient tolerated the procedure well with no immediate complications.

## 2019-07-07 NOTE — Op Note (Signed)
CARDIOTHORACIC SURGERY OPERATIVE NOTE  Date of Procedure: 07/07/2019  Preoperative Diagnosis:   Severe 2-vessel Coronary Artery Disease  S/P Acute Non-ST Segment Elevation Myocardial Infarction  Postoperative Diagnosis: Same  Procedure:    Coronary Artery Bypass Grafting x 2   Left Internal Mammary Artery to Distal Left Anterior Descending Coronary Artery  Free Right Internal Mammary Artery to Distal Right Coronary Artery with Short Reversed Greater Saphenous Vein Interposition at Proximal Anastomosis   Endoscopic Vein Harvest from Right Thigh   Surgeon: Valentina Gu. Roxy Manns, MD  Assistant: Enid Cutter, PA-C  Anesthesia: Rogue Jury, MD  Operative Findings:  Mild left ventricular systolic dysfunction  Moderate mitral regurgitation  Good quality left and right internal mammary artery conduits  Good quality saphenous vein conduit  Good quality target vessels for grafting  Improved mitral regurgitation after separation from bypass    BRIEF CLINICAL NOTE AND INDICATIONS FOR SURGERY  Patient is a 69 year old male with no previous history of coronary artery disease but risk factors notable for history of type 2 diabetes mellitus, hyperlipidemia, and a family history of coronary artery disease.  He presents with recent onset symptoms accelerating chest pain culminating in a prolonged episode of chest pain yesterday while he was in the emergency department that was ultimately relieved by administration of nitroglycerin.  Serial troponin levels were weakly positive consistent with acute coronary syndrome and mild non-ST segment elevation myocardial infarction.  Patient remained pain-free since he was admitted to the hospital.  The patient has been seen in consultation and counseled at length regarding the indications, risks and potential benefits of surgery.  All questions have been answered, and the patient provides full informed consent for the operation as  described.    DETAILS OF THE OPERATIVE PROCEDURE  Preparation:  The patient is brought to the operating room on the above mentioned date and central monitoring was established by the anesthesia team including placement of Swan-Ganz catheter and radial arterial line. The patient is placed in the supine position on the operating table.  Intravenous antibiotics are administered. General endotracheal anesthesia is induced uneventfully. A Foley catheter is placed.  Baseline transesophageal echocardiogram was performed.  Findings were notable for mild left ventricular systolic dysfunction with hypokinesis of the inferior wall.  There was moderate (2+) central mitral regurgitation that appeared functional.  The patient's chest, abdomen, both groins, and both lower extremities are prepared and draped in a sterile manner. A time out procedure is performed.   Surgical Approach and Conduit Harvest:  A median sternotomy incision was performed and the left and right internal mammary arteries were dissected from the chest wall and prepared for bypass grafting. Both internal mammary arteries were notably good quality conduit.  Following systemic heparinization, the both internal mammary arteries were transected distally noted to have excellent flow.   Extracorporeal Cardiopulmonary Bypass and Myocardial Protection:  The pericardium is opened. The ascending aorta is normal in appearance. The ascending aorta and the right atrium are cannulated for cardiopulmonary bypass.  Adequate heparinization is verified.     The entire pre-bypass portion of the operation was notable for stable hemodynamics.  Cardiopulmonary bypass was begun and the surface of the heart is inspected. Distal target vessels are selected for coronary artery bypass grafting.  Due to the position of the heart level in the mediastinum the right internal mammary artery graft would not reach to the distal right coronary artery for in situ use.   The right internal mammary artery was subsequently transected proximally just after  its takeoff from the subclavian artery to be utilized as a free graft.  The proximal arterial stump was doubly oversewn.    A short portion of greater saphenous vein is obtained from the patient's right thigh using endoscopic vein harvest technique. The saphenous vein is notably good quality conduit. After removal of the saphenous vein, the small surgical incisions in the lower extremity are closed with absorbable suture.  A cardioplegia cannula is placed in the ascending aorta.  A temperature probe was placed in the interventricular septum.  The patient is allowed to cool passively to [facility] systemic temperature.  The aortic cross clamp is applied and cold blood cardioplegia is delivered initially in an antegrade fashion through the aortic root.  Iced saline slush is applied for topical hypothermia.  The initial cardioplegic arrest is rapid with early diastolic arrest.  Repeat doses of cardioplegia are administered intermittently throughout the entire cross clamp portion of the operation through the aortic root and through subsequently placed vein grafts in order to maintain completely flat electrocardiogram and septal myocardial temperature below 15C.  Myocardial protection was felt to be  excellent.   Coronary Artery Bypass Grafting:   The distal right coronary artery was grafted using the right internal mammary artery in an end-to-side fashion.  At the site of distal anastomosis the target vessel was good quality and measured approximately 2.2 mm in diameter.  The distal left anterior coronary artery was grafted with the left internal mammary artery in an end-to-side fashion.  At the site of distal anastomosis the target vessel was good quality and measured approximately 2.0 mm in diameter.  The proximal end of the right internal mammary artery graft was sewn to the aorta using short interposition of reversed  greater saphenous vein graft.  The proximal end of the vein graft is placed directly to the ascending aorta prior to removal of the aortic cross clamp.  The septal myocardial temperature rose rapidly after reperfusion of the left internal mammary artery graft.  The aortic cross clamp was removed after a total cross clamp time of 54 minutes.  After removal of the aortic cross-clamp the proximal end of the right internal mammary artery graft is sewn to the short interposition vein graft in an end-to-end fashion with running 7-0 Prolene suture.   Procedure Completion:  All proximal and distal coronary anastomoses were inspected for hemostasis and appropriate graft orientation. Epicardial pacing wires are fixed to the right ventricular outflow tract and to the right atrial appendage. The patient is rewarmed to 37C temperature. The patient is weaned and disconnected from cardiopulmonary bypass.  The patient's rhythm at separation from bypass was sinus.  The patient was weaned from cardiopulmonary bypass without any inotropic support. Total cardiopulmonary bypass time for the operation was 80 minutes.  Followup transesophageal echocardiogram performed after separation from bypass revealed no changes from the preoperative exam although the mitral regurgitation appeared to have improved.  The aortic and venous cannula were removed uneventfully. Protamine was administered to reverse the anticoagulation. The mediastinum and pleural space were inspected for hemostasis and irrigated with saline solution. The mediastinum and both pleural space were drained using 4 chest tubes placed through separate stab incisions inferiorly.  The soft tissues anterior to the aorta were reapproximated loosely. The sternum is closed with double strength sternal wire. The soft tissues anterior to the sternum were closed in multiple layers and the skin is closed with a running subcuticular skin closure.  The post-bypass portion of the  operation was  notable for stable rhythm and hemodynamics.  No blood products were administered during the operation.   Disposition:  The patient tolerated the procedure well and is transported to the surgical intensive care in stable condition. There are no intraoperative complications. All sponge instrument and needle counts are verified correct at completion of the operation.    Valentina Gu. Roxy Manns MD 07/07/2019 2:16 PM

## 2019-07-07 NOTE — Transfer of Care (Signed)
Immediate Anesthesia Transfer of Care Note  Patient: Corey [name]  Procedure(s) Performed: CORONARY ARTERY BYPASS GRAFTING (CABG) using LIMA to LAD; Free IMA to RCA. (N/A Chest) TRANSESOPHAGEAL ECHOCARDIOGRAM (TEE) (N/A ) Endovein Harvest Of Greater Saphenous Vein (Right Leg Upper)  Patient Location: ICU  Anesthesia Type:General  Level of Consciousness: Patient remains intubated per anesthesia plan  Airway & Oxygen Therapy: Patient remains intubated per anesthesia plan and Patient placed on Ventilator (see vital sign flow sheet for setting)  Post-op Assessment: Report given to RN and Post -op Vital signs reviewed and stable  Post vital signs: Reviewed and stable  Last Vitals:  Vitals Value Taken Time  BP    Temp    Pulse    Resp    SpO2      Last Pain:  Vitals:   07/07/19 0513  TempSrc: Oral  PainSc:          Complications: No apparent anesthesia complications

## 2019-07-07 NOTE — Progress Notes (Signed)
  Echocardiogram Echocardiogram Transesophageal has been performed.  Corey [name] M 07/07/2019, 8:17 AM

## 2019-07-07 NOTE — Progress Notes (Signed)
CT surgery p.m. Rounds  Patient hemodynamically stable Extubated Minimal chest tube output Work satisfactory Today's Vitals   07/07/19 1800 07/07/19 1805 07/07/19 1834 07/07/19 1900  BP: 109/75   115/73  Pulse: 86   92  Resp: 14   16  Temp:      TempSrc:      SpO2: 99% 100% 96% 100%  Weight:      Height:      PainSc:       Body mass index is 28.02 kg/m.

## 2019-07-07 NOTE — Anesthesia Procedure Notes (Signed)
Procedure Name: Intubation Date/Time: 07/07/2019 7:43 AM Performed by: Janace Litten, CRNA Pre-anesthesia Checklist: Patient identified, Emergency Drugs available, Suction available and Patient being monitored Patient Re-evaluated:Patient Re-evaluated prior to induction Oxygen Delivery Method: Circle System Utilized Preoxygenation: Pre-oxygenation with 100% oxygen Induction Type: IV induction Ventilation: Mask ventilation without difficulty and Oral airway inserted - appropriate to patient size Laryngoscope Size: Mac and 4 Grade View: Grade I Tube type: Oral Tube size: 8.0 mm Number of attempts: 1 Airway Equipment and Method: Stylet Placement Confirmation: ETT inserted through vocal cords under direct vision,  positive ETCO2 and breath sounds checked- equal and bilateral Secured at: 23 cm Tube secured with: Tape Dental Injury: Teeth and Oropharynx as per pre-operative assessment

## 2019-07-07 NOTE — Anesthesia Procedure Notes (Signed)
Arterial Line Insertion Start/End5/13/2021 7:05 AM Performed by: Janace Litten, CRNA, CRNA  Preanesthetic checklist: patient identified, IV checked, risks and benefits discussed, surgical consent, monitors and equipment checked and pre-op evaluation Lidocaine 1% used for infiltration and patient sedated Left, radial was placed Catheter size: 20 G Hand hygiene performed  and maximum sterile barriers used   Attempts: 1 Procedure performed without using ultrasound guided technique. Ultrasound Notes:needle tip was noted to be adjacent to the nerve/plexus identified and no ultrasound evidence of intravascular and/or intraneural injection Following insertion, dressing applied and Biopatch. Post procedure assessment: normal  Patient tolerated the procedure well with no immediate complications.

## 2019-07-07 NOTE — Procedures (Signed)
Extubation Procedure Note  Patient Details:   Name: Corey [name] DOB: 16-Sep-1950 MRN: [id number]   Airway Documentation:    Vent end date: 07/07/19 Vent end time: 1834   Evaluation  O2 sats: stable throughout Complications: No apparent complications Patient did tolerate procedure well. Bilateral Breath Sounds: Clear, Diminished   Pt extubated to 4L [place]. NIF was -20 and VC was 1200. Cuff leak was positive. Pt able to vocalize.  [name] 07/07/2019, 6:35 PM

## 2019-07-07 NOTE — Brief Op Note (Signed)
07/05/2019 - 07/07/2019  1:24 PM  PATIENT:  Corey [name]  69 y.o. male  PRE-OPERATIVE DIAGNOSIS:  CORONARY ARTERY DISEASE  POST-OPERATIVE DIAGNOSIS:  CORONARY ARTERY DISEASE  PROCEDURES:  CORONARY ARTERY BYPASS GRAFTING using  -LIMA to LAD  -Free RIMA to RCA.    -BILATERAL IMA HARVEST -Endovein Harvest Of Greater Saphenous Vein (Right) -TRANSESOPHAGEAL ECHOCARDIOGRAM    SURGEON:  Rexene Alberts, MD - Primary  PHYSICIAN ASSISTANT: Kaleya Douse   ANESTHESIA:   general  EBL:  Per anesthesia and perfusion notes   BLOOD ADMINISTERED:none  DRAINS: Mediastinal and bilateral pleural drains   LOCAL MEDICATIONS USED:  NONE  SPECIMEN:  No Specimen  DISPOSITION OF SPECIMEN:  N/A  COUNTS:  Correct  DICTATION: .Dragon Dictation  PLAN OF CARE: Admit to inpatient   PATIENT DISPOSITION:  ICU - intubated and hemodynamically stable.   Delay start of Pharmacological VTE agent (>24hrs) due to surgical blood loss or risk of bleeding: yes

## 2019-07-08 ENCOUNTER — Inpatient Hospital Stay (HOSPITAL_COMMUNITY): Payer: Managed Care, Other (non HMO)

## 2019-07-08 ENCOUNTER — Encounter: Payer: Self-pay | Admitting: *Deleted

## 2019-07-08 LAB — MAGNESIUM
Magnesium: 2.1 mg/dL (ref 1.7–2.4)
Magnesium: 2 mg/dL (ref 1.7–2.4)

## 2019-07-08 LAB — CBC
HCT: 33 % — ABNORMAL LOW (ref 39.0–52.0)
HCT: 33.3 % — ABNORMAL LOW (ref 39.0–52.0)
Hemoglobin: 10.9 g/dL — ABNORMAL LOW (ref 13.0–17.0)
Hemoglobin: 11.1 g/dL — ABNORMAL LOW (ref 13.0–17.0)
MCH: 29.1 pg (ref 26.0–34.0)
MCH: 29.4 pg (ref 26.0–34.0)
MCHC: 33 g/dL (ref 30.0–36.0)
MCHC: 33.3 g/dL (ref 30.0–36.0)
MCV: 88.2 fL (ref 80.0–100.0)
MCV: 88.1 fL (ref 80.0–100.0)
Platelets: 148 10*3/uL — ABNORMAL LOW (ref 150–400)
Platelets: 152 10*3/uL (ref 150–400)
RBC: 3.74 MIL/uL — ABNORMAL LOW (ref 4.22–5.81)
RBC: 3.78 MIL/uL — ABNORMAL LOW (ref 4.22–5.81)
RDW: 13.8 % (ref 11.5–15.5)
RDW: 14.2 % (ref 11.5–15.5)
WBC: 11.9 10*3/uL — ABNORMAL HIGH (ref 4.0–10.5)
WBC: 13.2 10*3/uL — ABNORMAL HIGH (ref 4.0–10.5)
nRBC: 0 % (ref 0.0–0.2)
nRBC: 0 % (ref 0.0–0.2)

## 2019-07-08 LAB — BASIC METABOLIC PANEL
Anion gap: 7 (ref 5–15)
Anion gap: 11 (ref 5–15)
BUN: 16 mg/dL (ref 8–23)
BUN: 18 mg/dL (ref 8–23)
CO2: 22 mmol/L (ref 22–32)
CO2: 21 mmol/L — ABNORMAL LOW (ref 22–32)
Calcium: 8 mg/dL — ABNORMAL LOW (ref 8.9–10.3)
Calcium: 8.4 mg/dL — ABNORMAL LOW (ref 8.9–10.3)
Chloride: 107 mmol/L (ref 98–111)
Chloride: 103 mmol/L (ref 98–111)
Creatinine, Ser: 0.97 mg/dL (ref 0.61–1.24)
Creatinine, Ser: 0.99 mg/dL (ref 0.61–1.24)
GFR calc Af Amer: >60 mL/min (ref >60)
GFR calc Af Amer: >60 mL/min (ref >60)
GFR calc non Af Amer: >60 mL/min (ref >60)
GFR calc non Af Amer: >60 mL/min (ref >60)
Glucose, Bld: 128 mg/dL — ABNORMAL HIGH (ref 70–99)
Glucose, Bld: 179 mg/dL — ABNORMAL HIGH (ref 70–99)
Potassium: 4.4 mmol/L (ref 3.5–5.1)
Potassium: 4.3 mmol/L (ref 3.5–5.1)
Sodium: 136 mmol/L (ref 135–145)
Sodium: 135 mmol/L (ref 135–145)

## 2019-07-08 LAB — GLUCOSE, CAPILLARY
Glucose-Capillary: 120 mg/dL — ABNORMAL HIGH (ref 70–99)
Glucose-Capillary: 128 mg/dL — ABNORMAL HIGH (ref 70–99)
Glucose-Capillary: 129 mg/dL — ABNORMAL HIGH (ref 70–99)
Glucose-Capillary: 133 mg/dL — ABNORMAL HIGH (ref 70–99)
Glucose-Capillary: 138 mg/dL — ABNORMAL HIGH (ref 70–99)
Glucose-Capillary: 141 mg/dL — ABNORMAL HIGH (ref 70–99)
Glucose-Capillary: 144 mg/dL — ABNORMAL HIGH (ref 70–99)
Glucose-Capillary: 150 mg/dL — ABNORMAL HIGH (ref 70–99)
Glucose-Capillary: 166 mg/dL — ABNORMAL HIGH (ref 70–99)
Glucose-Capillary: 167 mg/dL — ABNORMAL HIGH (ref 70–99)

## 2019-07-08 MED ORDER — EZETIMIBE 10 MG PO TABS
10.0000 mg | ORAL_TABLET | Freq: Every day | ORAL | Status: DC
  Administered 2019-07-10 – 2019-07-11 (×2): 10 mg via ORAL
  Filled 2019-07-08 (×2): qty 1

## 2019-07-08 MED ORDER — ASPIRIN EC 81 MG PO TBEC
81.0000 mg | DELAYED_RELEASE_TABLET | Freq: Every day | ORAL | Status: DC
  Administered 2019-07-09 – 2019-07-11 (×3): 81 mg via ORAL
  Filled 2019-07-08 (×3): qty 1

## 2019-07-08 MED ORDER — ATORVASTATIN CALCIUM 40 MG PO TABS: 40.0000 mg | ORAL_TABLET | Freq: Every day | ORAL | Status: DC

## 2019-07-08 MED ORDER — ENOXAPARIN SODIUM 40 MG/0.4ML ~~LOC~~ SOLN
40.0000 mg | Freq: Every day | SUBCUTANEOUS | Status: DC
  Administered 2019-07-09 – 2019-07-10 (×2): 40 mg via SUBCUTANEOUS
  Filled 2019-07-08 (×2): qty 0.4

## 2019-07-08 MED ORDER — CLOPIDOGREL BISULFATE 75 MG PO TABS
75.0000 mg | ORAL_TABLET | Freq: Every day | ORAL | Status: DC
  Administered 2019-07-09 – 2019-07-11 (×3): 75 mg via ORAL
  Filled 2019-07-08 (×3): qty 1

## 2019-07-08 MED ORDER — ASPIRIN EC 325 MG PO TBEC
325.0000 mg | DELAYED_RELEASE_TABLET | Freq: Every day | ORAL | Status: AC
  Administered 2019-07-08: 325 mg via ORAL
  Filled 2019-07-08: qty 1

## 2019-07-08 MED ORDER — INSULIN ASPART 100 UNIT/ML ~~LOC~~ SOLN
0.0000 [IU] | SUBCUTANEOUS | Status: DC
  Administered 2019-07-08: 2 [IU] via SUBCUTANEOUS
  Administered 2019-07-08: 4 [IU] via SUBCUTANEOUS
  Administered 2019-07-08 – 2019-07-09 (×3): 2 [IU] via SUBCUTANEOUS

## 2019-07-08 MED FILL — Heparin Sodium (Porcine) Inj 1000 Unit/ML: INTRAMUSCULAR | Qty: 30 | Status: AC

## 2019-07-08 MED FILL — Tranexamic Acid IV Soln 1000 MG/10ML (100 MG/ML): INTRAVENOUS | Qty: 3000 | Status: AC

## 2019-07-08 MED FILL — Potassium Chloride Inj 2 mEq/ML: INTRAVENOUS | Qty: 40 | Status: AC

## 2019-07-08 MED FILL — Magnesium Sulfate Inj 50%: INTRAMUSCULAR | Qty: 10 | Status: AC

## 2019-07-08 NOTE — Progress Notes (Addendum)
TCTS DAILY ICU PROGRESS NOTE                   Corey [name]            [place] [address]          [phone]   1 Day Post-Op Procedure(s) (LRB): CORONARY ARTERY BYPASS GRAFTING (CABG) using LIMA to LAD; Free IMA to RCA. (N/A) TRANSESOPHAGEAL ECHOCARDIOGRAM (TEE) (N/A) Endovein Harvest Of Greater Saphenous Vein (Right)  Total Length of Stay:  LOS: 3 days   Subjective: Extubated at 6:30 last evening. Corey [name] is awake and alert, pain control reasonable.   On low-dose Neo-synephrine and insulin drip.  Objective: Vital signs in last 24 hours: Temp:  [97.9 F (36.6 C)-98.9 F (37.2 C)] 98.3 F (36.8 C) (05/14 0715) Pulse Rate:  [80-97] 94 (05/14 0715) Cardiac Rhythm: Normal sinus rhythm (05/14 0400) Resp:  [12-24] 20 (05/14 0715) BP: (93-135)/(52-88) 104/68 (05/14 0700) SpO2:  [96 %-100 %] 98 % (05/14 0715) Arterial Line BP: (93-133)/(44-71) 133/65 (05/14 0715) FiO2 (%):  [40 %-50 %] 40 % (05/13 1805) Weight:  [97.4 kg] 97.4 kg (05/14 0500)  Filed Weights   07/05/19 1846 07/06/19 0420 07/08/19 0500  Weight: 94.4 kg 96.3 kg 97.4 kg    Weight change:    Hemodynamic parameters for last 24 hours: CVP:  [2 mmHg-19 mmHg] 12 mmHg  Intake/Output from previous day: 05/13 0701 - 05/14 0700 In: 5618.3 [I.V.:4241.4; Blood:360; IV [id number] Out: [id number] [Urine:2225; Emesis/NG output:100; Blood:527; Chest Tube:780]  Intake/Output this shift: No intake/output data recorded.  Current Meds: Scheduled Meds: . acetaminophen  1,000 mg Oral Q6H  . aspirin EC  325 mg Oral Daily  . [START ON 07/09/2019] aspirin EC  81 mg Oral Daily  . [START ON 07/10/2019] atorvastatin  40 mg Oral Daily  . bisacodyl  10 mg Oral Daily   Or  . bisacodyl  10 mg Rectal Daily  . Chlorhexidine Gluconate Cloth  6 each Topical Daily  . [START ON 07/09/2019] clopidogrel  75 mg Oral Daily  . docusate sodium  200 mg Oral Daily  . [START ON 07/09/2019] enoxaparin (LOVENOX) injection  40 mg  Subcutaneous QHS  . [START ON 07/10/2019] ezetimibe  10 mg Oral Daily  . insulin aspart  0-24 Units Subcutaneous Q4H  . mouth rinse  15 mL Mouth Rinse BID  . metoprolol tartrate  12.5 mg Oral BID  . pantoprazole  40 mg Oral Daily  . sodium chloride flush  10-40 mL Intracatheter Q12H  . sodium chloride flush  3 mL Intravenous Q12H   Continuous Infusions: . sodium chloride    . cefUROXime (ZINACEF)  IV 200 mL/hr at 07/08/19 0700  . lactated ringers    . lactated ringers     PRN Meds:.dextrose, metoprolol tartrate, morphine injection, ondansetron (ZOFRAN) IV, oxyCODONE, sodium chloride flush, sodium chloride flush, traMADol  General appearance: alert, cooperative and mild distress Neurologic: intact Heart: regular rate and rhythm Lungs: Breath sounds are clear, shallow.  Abdomen: soft, NT. Extremities: All well perfused, EVH incision is covered with a dry dressing. No evidnece of thigh hematoma.  Wound: The chest incision is covered with a dry dressing. CT drainage total 584ml for past 12 hours.   Lab Results: CBC: Recent Labs    07/07/19 2257 07/08/19 0431  WBC 11.4* 11.9*  HGB 11.5* 10.9*  HCT 34.6* 33.0*  PLT 130* 148*   BMET:  Recent Labs    07/07/19 2257 07/08/19 0431  NA 137 136  K 4.6 4.4  CL 107 107  CO2 21* 22  GLUCOSE 137* 128*  BUN 17 16  CREATININE 0.96 0.97  CALCIUM 7.9* 8.0*    CMET: Lab Results  Component Value Date   WBC 11.9 (H) 07/08/2019   HGB 10.9 (L) 07/08/2019   HCT 33.0 (L) 07/08/2019   PLT 148 (L) 07/08/2019   GLUCOSE 128 (H) 07/08/2019   CHOL 186 05/16/2019   TRIG 93.0 05/16/2019   HDL 43.90 05/16/2019   LDLDIRECT 191.5 04/15/2013   LDLDIRECT 193.6 04/15/2013   LDLCALC 123 (H) 05/16/2019   ALT 51 (H) 07/06/2019   AST 75 (H) 07/06/2019   NA 136 07/08/2019   K 4.4 07/08/2019   CL 107 07/08/2019   CREATININE 0.97 07/08/2019   BUN 16 07/08/2019   CO2 22 07/08/2019   TSH 1.672 07/06/2019   PSA 0.97 05/16/2019   INR 1.2  07/07/2019   HGBA1C 6.3 (H) 07/06/2019   MICROALBUR <0.7 01/07/2019      PT/INR:  Recent Labs    07/07/19 1500  LABPROT 14.9  INR 1.2   Radiology: [facility] Chest Port 1 View  Result Date: 07/08/2019 CLINICAL DATA:  Status post CABG. EXAM: PORTABLE CHEST 1 VIEW COMPARISON:  Chest x-ray from yesterday. FINDINGS: Interval removal of the endotracheal and enteric tubes. Unchanged right internal jugular central venous catheter, mediastinal drain, and bilateral chest tubes. Stable cardiomediastinal silhouette status post CABG. Normal pulmonary vascularity. Unchanged mild left basilar atelectasis. No pleural effusion or pneumothorax. No acute osseous abnormality. IMPRESSION: 1. Interval extubation. Stable chest with mild left basilar atelectasis. Electronically Signed   By: Titus Dubin M.D.   On: 07/08/2019 07:58   DG Chest Port 1 View  Result Date: 07/07/2019 CLINICAL DATA:  Status post coronary bypass grafting EXAM: PORTABLE CHEST 1 VIEW COMPARISON:  07/05/2019 FINDINGS: Postsurgical changes are now seen. Endotracheal tube, gastric catheter, pericardial drain and bilateral thoracostomy catheters are seen. Right jugular central line is noted in the mid superior vena cava. No pneumothorax is seen. No sizable infiltrate or effusion is noted. Mediastinal drain is noted as well. IMPRESSION: Tubes and lines as described above. No acute postoperative abnormality is seen. Electronically Signed   By: Inez Catalina M.D.   On: 07/07/2019 15:30     Assessment/Plan: S/P Procedure(s) (LRB): CORONARY ARTERY BYPASS GRAFTING (CABG) using LIMA to LAD; Free IMA to RCA. (N/A) TRANSESOPHAGEAL ECHOCARDIOGRAM (TEE) (N/A) Endovein Harvest Of Greater Saphenous Vein (Right)  -POD1 CABG x 2 with bilateral IMA for proximal LAC RCA disease presenting with acute NSTEMI. Hemodynamically stable, no significant arrhythmias since surgery.  Wean Neo off today, mobilize, D/C PA catheter. Resume ASA, statin. Add Plavix and  low-dose metoprolol. Leave chest tubes for now. Work on IS for bibasilar ATX.[email or web address] Kitchen   -Type II DM- Glucose controlled with insulin drip. Convert to [place].  -Expected acute blood loss anemia- Mild, minimal CT drainage. Monitor.   -DVT PPX- Begin daily [place] Lovenox.    -H/O GERD / Barretts esophagus--On PPI   Antony Odea, PA-C [phone] 07/08/2019 8:25 AM   I have seen and examined the patient and agree with the assessment and plan as outlined.  Doing very well POD1  Corey Alberts, MD 07/08/2019 2:34 PM

## 2019-07-08 NOTE — Progress Notes (Signed)
TCTS BRIEF SICU PROGRESS NOTE  1 Day Post-Op  S/P Procedure(s) (LRB): CORONARY ARTERY BYPASS GRAFTING (CABG) using LIMA to LAD; Free IMA to RCA. (N/A) TRANSESOPHAGEAL ECHOCARDIOGRAM (TEE) (N/A) Endovein Harvest Of Greater Saphenous Vein (Right)   Stable day Some nausea, adequate pain control NSR w/ stable BP off Neo Breathing comfortably on room air Labs okay  Plan: Continue current plan  Rexene Alberts, MD 07/08/2019 6:36 PM

## 2019-07-08 NOTE — Discharge Summary (Signed)
Physician Discharge Summary  Patient ID: Corey [name] MRN: [id number] DOB/AGE: 1950/05/05 69 y.o.  Admit date: 07/05/2019 Discharge date: 07/11/2019  Admission Diagnoses: Multivessel coronary artery disease Acute NSTEMI Type 2 diabetes mellitus Dyslipidemia  Discharge Diagnoses:   Multivessel coronary artery disease Acute NSTEMI Type 2 diabetes mellitus Dyslipidemia S/P CABG x 2 Expected acute blood loss anemia   Discharged Condition: stable  History of Present Illness:  Patient is a 69 year old male with no previous history of coronary artery disease but risk factors notable for history of type 2 diabetes mellitus, hyperlipidemia, and a family history of coronary artery disease.  He presents with recent onset symptoms accelerating chest pain culminating in a prolonged episode of chest pain yesterday while he was in the emergency department that was ultimately relieved by administration of nitroglycerin.  Serial troponin levels were weakly positive consistent with acute coronary syndrome and mild non-ST segment elevation myocardial infarction.  Patient has remained pain-free since he was admitted to the hospital.  I have personally reviewed the patient's diagnostic cardiac catheterization and transthoracic echocardiogram.  Catheterization revealed severe two-vessel coronary artery disease with high-grade long segment proximal stenosis of the left anterior descending coronary artery and 100% chronic occlusion of the right coronary artery with left-to-right collateral filling of the terminal branches of the right coronary artery.  There is mild nonobstructive disease in the left circumflex territory.  There is preserved left ventricular systolic dysfunction.  I agree the patient would best be treated with surgical revascularization.  Hospital Course:   The patient was taken to the operating room on 07/07/2019.  He underwent CABG x 2 utilizing LIMA to LAD and Free RIMA to distal RCA.   He also underwent endoscopic harvest of greater saphenous vein from the right thigh.  He tolerated the procedure without difficulty and was taken to the SICU in stable condition. He was extubated the evening of surgery.  He was weaned off Neo-synephrine as hemodynamics allowed. His chest tubes and arterial lines were removed without difficulty.  He was started on IV lasix for mild volume overload.  He was maintaining NSR and was medically stable for transfer to the progressive care unit on 07/09/2019.  He continued to make good progress.  He remains in NSR and his pacing wires were removed prior to discharge without difficulty.  He developed a mild elevation in his creatinine.  This was felt to be due to Toradol usage and diuretics, both of which were discontinued.  He developed hypertension and he was restarted on his home ARB at a reduced dose with improvement. He will be discharged on his regular dose amlodipine - olmesartan 5-40mg  .   He quickly regained independence with mobility and self care. He was mildly tachycardic on the morning of discharge. Metoprolol was adjusted.  We arranged follow up with the [facility] to assist with medication management since he is intolerant of statins.   Consults: cardiology  Significant Diagnostic Studies:  LEFT HEART CATH AND CORONARY ANGIOGRAPHY  Conclusion    Mid LM to Dist LM lesion is 30% stenosed.  Ost LAD to Prox LAD lesion is 90% stenosed.  Prox RCA lesion is 100% stenosed.  The left ventricular systolic function is normal.  LV end diastolic pressure is normal.  The left ventricular ejection fraction is 50-55% by visual estimate.  There is no mitral valve regurgitation.   1. Severe 2 vessel CAD     - 90% ostial LAD stenosis. Heavily calcified    - 100% proximal  RCA with left to right collateral. Vessel is also heavily calcified. 2. Inferior hypokinesis with overall well preserved LV function 3. Normal LVEDP  Plan: patient has complex  CAD with total occlusion of a large RCA with collaterals and ostial LAD which supplies a large LAD and large diagonal. It would be difficult to manage this with PCI since stenting of the LAD would require jailing the LCx and could compromise this vessel. I would recommend consideration for CABG. CT surgery consulted.    Recommendations  Antiplatelet/Anticoag Recommend Aspirin 81mg  daily for moderate CAD.  Surgeon Notes    07/07/2019 2:42 PM Operative Note - Scan signed by Default, Provider, MD    07/07/2019 2:40 PM Op Note signed by Rexene Alberts, MD  Indications  Non-ST elevation (NSTEMI) myocardial infarction ([place]) [I21.4 (ICD-10-CM)]  Procedural Details  Technical Details Indication: 69 yo WM with recent CT showing high coronary calcium score presents with NSTEMI  Procedural Details: The right wrist was prepped, draped, and anesthetized with 1% lidocaine. Using the modified Seldinger technique, a 6 French slender sheath was introduced into the right radial artery. 3 mg of verapamil was administered through the sheath, weight-based unfractionated heparin was administered intravenously. Standard Judkins catheters were used for selective coronary angiography and left ventriculography. Catheter exchanges were performed over an exchange length guidewire. There were no immediate procedural complications. A TR band was used for radial hemostasis at the completion of the procedure.  The patient was transferred to the post catheterization recovery area for further monitoring. Contrast: 50 cc Estimated blood loss <50 mL.   During this procedure medications were administered to achieve and maintain moderate conscious sedation while the patient's heart rate, blood pressure, and oxygen saturation were continuously monitored and I was present face-to-face 100% of this time.  Medications (Filter: Administrations occurring from 07/05/19 1626 to 07/05/19 1715) (important)  Continuous medications are  totaled by the amount administered until 07/05/19 1715.  Heparin (Porcine) in NaCl 1000-0.9 UT/500ML-% SOLN (mL) Total volume:  1,000 mL  Date/Time  Rate/Dose/Volume Action  07/05/19 1638  500 mL Given  1639  500 mL Given    midazolam (VERSED) injection (mg) Total dose:  2 mg  Date/Time  Rate/Dose/Volume Action  07/05/19 1639  2 mg Given    fentaNYL (SUBLIMAZE) injection (mcg) Total dose:  25 mcg  Date/Time  Rate/Dose/Volume Action  07/05/19 1641  25 mcg Given    lidocaine (PF) (XYLOCAINE) 1 % injection (mL) Total volume:  2 mL  Date/Time  Rate/Dose/Volume Action  07/05/19 1650  2 mL Given    Radial Cocktail/Verapamil only (mL) Total volume:  10 mL  Date/Time  Rate/Dose/Volume Action  07/05/19 1651  10 mL Given    heparin sodium (porcine) injection (Units) Total dose:  4,500 Units  Date/Time  Rate/Dose/Volume Action  07/05/19 1653  4,500 Units Given    0.9 % sodium chloride infusion (mL/hr) Total dose:  Cannot be calculated* Dosing weight:  93  *Continuous medication not stopped within the calculation time range. Date/Time  Rate/Dose/Volume Action  07/05/19 1644  93 mL/hr New Bag/Given    iohexol (OMNIPAQUE) 350 MG/ML injection (mL) Total volume:  50 mL  Date/Time  Rate/Dose/Volume Action  07/05/19 1710  50 mL Given    sodium chloride flush (NS) 0.9 % injection 3 mL (mL) Total dose:  Cannot be calculated* Dosing weight:  93  *Administration dose not documented Date/Time  Rate/Dose/Volume Action  07/05/19 1626  *Not included in total MAR Hold    heparin  ADULT infusion 100 units/mL (25000 units/262mL sodium chloride 0.45%) (Units/hr) Total dose:  Cannot be calculated* Dosing weight:  93  *Administration dose not documented Date/Time  Rate/Dose/Volume Action  07/05/19 1632   Stopped    heparin injection 4,000 Units (Units) Total dose:  Cannot be calculated* Dosing weight:  93  *Administration dose not documented Date/Time  Rate/Dose/Volume Action    07/05/19 1626  *Not included in total MAR Hold    nitroGLYCERIN (NITROSTAT) SL tablet 0.4 mg (mg) Total dose:  Cannot be calculated* Dosing weight:  93  *Administration dose not documented Date/Time  Rate/Dose/Volume Action  07/05/19 1626  *Not included in total MAR Hold    sodium chloride flush (NS) 0.9 % injection 3 mL (mL) Total dose:  Cannot be calculated* Dosing weight:  93  *Administration dose not documented Date/Time  Rate/Dose/Volume Action  07/05/19 1626  *Not included in total MAR Hold    Sedation Time  Sedation Time Physician-1: 24 minutes 32 seconds  Contrast  Medication Name Total Dose  iohexol (OMNIPAQUE) 350 MG/ML injection 50 mL    Radiation/Fluoro  Fluoro time: 3.8 (min) DAP: 19 (Gycm2) Cumulative Air Kerma: [id number] (mGy)  Complications  Complications documented before study signed (07/05/2019 [id number] PM)   No complications were associated with this study.  Documented by [name], Peter M, MD - 07/05/2019 5:16 PM    Coronary Findings  Diagnostic Dominance: Right Left Main  Mid LM to Dist LM lesion 30% stenosed  Mid LM to Dist LM lesion is 30% stenosed.  Left Anterior Descending  Vessel was injected. Vessel is large. The vessel is severely calcified.  Ost LAD to Prox LAD lesion 90% stenosed  Ost LAD to Prox LAD lesion is 90% stenosed.  Left Circumflex  Vessel was injected. Vessel is normal in caliber. Vessel is angiographically normal.  Right Coronary Artery  Vessel was injected. Vessel is large. The vessel is severely calcified.  Prox RCA lesion 100% stenosed  Prox RCA lesion is 100% stenosed.  Right Posterior Descending Artery  Collaterals  RPDA filled by collaterals from Dist LAD.    Intervention  No interventions have been documented. Wall Motion  Resting               Left Heart  Left Ventricle The left ventricular size is normal. The left ventricular systolic function is normal. LV end diastolic pressure is normal. The left  ventricular ejection fraction is 50-55% by visual estimate. There are LV function abnormalities due to segmental dysfunction. There is no evidence of mitral regurgitation.  Coronary Diagrams  Diagnostic Dominance: Right     ECHOCARDIOGRAM REPORT       Patient Name:  Corey [name] Date of Exam: 07/06/2019  Medical Rec #: [id number]    Height:    73.0 in  Accession #:  [id number]    Weight:    212.4 lb  Date of Birth: [birth year]    BSA:     2.207 m  Patient Age:  29 years     BP:      136/75 mmHg  Patient Gender: M        HR:      80 bpm.  Exam Location: Inpatient   Procedure: 2D Echo   Indications:  Chest Pain 786.50 / R07.9    History:    Patient has no prior history of Echocardiogram  examinations.         Coronary artery calcification; Risk Factors:Diabetes,         Hypertension  and Dyslipidemia. NSTEMI (non-ST elevated         myocardial infarction).    Sonographer:  Vikki Ports Turrentine  Referring Phys: [id number] [place]    1. Left ventricular ejection fraction, by estimation, is 45 to 50%. The  left ventricle has mildly decreased function. The left ventricle has no  regional wall motion abnormalities. Left ventricular diastolic parameters  are consistent with Grade I  diastolic dysfunction (impaired relaxation).  2. Right ventricular systolic function is normal. The right ventricular  size is normal.  3. The mitral valve is normal in structure. No evidence of mitral valve  regurgitation. No evidence of mitral stenosis.  4. The aortic valve is normal in structure. Aortic valve regurgitation is  not visualized. No aortic stenosis is present.  5. The inferior vena cava is normal in size with greater than 50%  respiratory variability, suggesting right atrial pressure of 3 mmHg.   FINDINGS  Left Ventricle: Left ventricular ejection fraction, by  estimation, is 45  to 50%. The left ventricle has mildly decreased function. The left  ventricle has no regional wall motion abnormalities. The left ventricular  internal cavity size was normal in  size. There is no left ventricular hypertrophy. Left ventricular diastolic  parameters are consistent with Grade I diastolic dysfunction (impaired  relaxation). Normal left ventricular filling pressure.     LV Wall Scoring:  The inferior wall and posterior wall are hypokinetic.   Right Ventricle: The right ventricular size is normal. No increase in  right ventricular wall thickness. Right ventricular systolic function is  normal.   Left Atrium: Left atrial size was normal in size.   Right Atrium: Right atrial size was normal in size.   Pericardium: There is no evidence of pericardial effusion.   Mitral Valve: The mitral valve is normal in structure. Normal mobility of  the mitral valve leaflets. No evidence of mitral valve regurgitation. No  evidence of mitral valve stenosis.   Tricuspid Valve: The tricuspid valve is normal in structure. Tricuspid  valve regurgitation is not demonstrated. No evidence of tricuspid  stenosis.   Aortic Valve: The aortic valve is normal in structure. Aortic valve  regurgitation is not visualized. No aortic stenosis is present.   Pulmonic Valve: The pulmonic valve was normal in structure. Pulmonic valve  regurgitation is not visualized. No evidence of pulmonic stenosis.   Aorta: The aortic root is normal in size and structure.   Venous: The inferior vena cava is normal in size with greater than 50%  respiratory variability, suggesting right atrial pressure of 3 mmHg.   IAS/Shunts: No atrial level shunt detected by color flow Doppler.     LEFT VENTRICLE  PLAX 2D  LVIDd:     5.05 cm Diastology  LVIDs:     3.59 cm LV e' lateral:  11.50 cm/s  LV PW:     0.94 cm LV E/e' lateral: 8.6  LV IVS:    0.91 cm LV e' medial:  8.16  cm/s  LVOT diam:   1.70 cm LV E/e' medial: 12.1  LV SV:     39  LV SV Index:  18  LVOT Area:   2.27 cm     RIGHT VENTRICLE  RV S prime:   15.60 cm/s  TAPSE (M-mode): 1.8 cm   LEFT ATRIUM       Index    RIGHT ATRIUM      Index  LA diam:    4.10 cm 1.86 cm/m  RA Area:   15.90 cm  LA Vol (A2C):  59.8 ml 27.09 ml/m RA Volume:  44.10 ml 19.98 ml/m  LA Vol (A4C):  46.9 ml 21.25 ml/m  LA Biplane Vol: 54.1 ml 24.51 ml/m  AORTIC VALVE  LVOT Vmax:  94.60 cm/s  LVOT Vmean: 68.200 cm/s  LVOT VTI:  0.173 m   MITRAL VALVE  MV Area (PHT): 4.06 cm   SHUNTS  MV Decel Time: 187 msec   Systemic VTI: 0.17 m  MV E velocity: 98.50 cm/s  Systemic Diam: 1.70 cm  MV A velocity: 107.00 cm/s  MV E/A ratio: 0.92   Ena Dawley MD  Electronically signed by Ena Dawley MD  Signature Date/Time: 07/06/2019/4:01:18 PM    CHEST - 2 VIEW  COMPARISON:  07/09/2018  FINDINGS: The heart size scratch set status post median sternotomy and CABG. Normal heart size. Small bilateral pleural effusions are identified. No interstitial edema or airspace consolidation.  IMPRESSION: Small bilateral pleural effusions.   Electronically Signed   By: Kerby Moors M.D.   On: 07/10/2019 09:50    Treatments:   CARDIOTHORACIC SURGERY OPERATIVE NOTE  Date of Procedure:    07/07/2019  Preoperative Diagnosis:        Severe 2-vessel Coronary Artery Disease  S/P Acute Non-ST Segment Elevation Myocardial Infarction  Postoperative Diagnosis:    Same  Procedure:        Coronary Artery Bypass Grafting x 2              Left Internal Mammary Artery to Distal Left Anterior Descending Coronary Artery             Free Right Internal Mammary Artery to Distal Right Coronary Artery with Short Reversed Greater Saphenous Vein Interposition at Proximal Anastomosis              Endoscopic Vein Harvest from Right Thigh   Surgeon:        Valentina Gu. Roxy Manns, MD  Assistant:       Enid Cutter, PA-C  Anesthesia:    Rogue Jury, MD  Operative Findings: ? Mild left ventricular systolic dysfunction ? Moderate mitral regurgitation ? Good quality left and right internal mammary artery conduits ? Good quality saphenous vein conduit ? Good quality target vessels for grafting ? Improved mitral regurgitation after separation from bypass    BRIEF CLINICAL NOTE AND INDICATIONS FOR SURGERY  Patient is a 70 year old male with no previous history of coronary artery disease but risk factors notable for history of type 2 diabetes mellitus, hyperlipidemia, and a family history of coronary artery disease. He presents with recent onset symptoms accelerating chest pain culminating in a prolonged episode of chest pain yesterday while he was in the emergency department that was ultimately relieved by administration of nitroglycerin. Serial troponin levels were weakly positive consistent with acute coronary syndrome and mild non-ST segment elevation myocardial infarction. Patient remained pain-free since he was admitted to the hospital.  The patient has been seen in consultation and counseled at length regarding the indications, risks and potential benefits of surgery.  All questions have been answered, and the patient provides full informed consent for the operation as described.   Discharge Exam: Blood pressure 130/79, pulse (!) 107, temperature 98.4 F (36.9 C), temperature source Oral, resp. rate 14, height 6\' 1"  (1.854 m), weight 91.4 kg, SpO2 99 %.  General appearance: alert, cooperative and no distress Neurologic: intact Heart: S.tach 110-120. Lungs: BS clear Extremities: No edema, right LE ext EVH incision  is intact and dry.  Wound: the sternal incision is open to air and is clean and dry.   Disposition:   Discharge Instructions    Amb Referral to Cardiac Rehabilitation   Complete by: As directed    Diagnosis:  CABG NSTEMI       CABG X ___: 2   After initial evaluation and assessments completed: Virtual Based Care may be provided alone or in conjunction with Phase 2 Cardiac Rehab based on patient barriers.: Yes     Allergies as of 07/11/2019      Reactions   Albuterol    REACTION: "feels like drowning"   Aspirin Other (See Comments)   Causes bruising   Ciprofloxacin    REACTION: rash   Clarithromycin    REACTION: swelling   Colesevelam    REACTION: constip   Enalapril Maleate    REACTION: Erectile dysfunction   Esomeprazole Magnesium    REACTION: epigastric pain   Niacin    REACTION: diarrhea   Pravastatin Sodium    REACTION: aching, listless   Rosuvastatin    REACTION: achy   Penicillins Hives   Has patient had a PCN reaction causing immediate rash, facial/tongue/throat swelling, SOB or lightheadedness with hypotension: Yes Has patient had a PCN reaction causing severe rash involving mucus membranes or skin necrosis: Yes Has patient had a PCN reaction that required hospitalization No Has patient had a PCN reaction occurring within the last 10 years: No If all of the above answers are "NO", then may proceed with Cephalosporin use.      Medication List    STOP taking these medications   OVER THE COUNTER MEDICATION   OVER THE COUNTER MEDICATION     TAKE these medications   acetaminophen 650 MG CR tablet Commonly known as: TYLENOL Take 650 mg by mouth every 8 (eight) hours as needed for pain.   amLODipine-olmesartan 5-40 MG tablet Commonly known as: AZOR TAKE ONE-HALF (1/2) TABLET BY MOUTH DAILY What changed:   how much to take  how to take this  when to take this   aspirin 81 MG EC tablet Take 1 tablet (81 mg total) by mouth daily.   cetirizine 10 MG tablet Commonly known as: ZYRTEC Take 10 mg by mouth 2 (two) times daily. Takes twice daily   cholecalciferol 25 MCG (1000 UNIT) tablet Commonly known as: VITAMIN D3 Take 5,000 Units by mouth daily.   clopidogrel 75 MG  tablet Commonly known as: PLAVIX Take 1 tablet (75 mg total) by mouth daily.   ezetimibe 10 MG tablet Commonly known as: ZETIA Take 1 tablet (10 mg total) by mouth daily.   Magnesium 400 MG Caps Take by mouth 3 (three) times daily.   metFORMIN 500 MG tablet Commonly known as: GLUCOPHAGE Take 1 tablet (500 mg total) by mouth daily with supper.   metoprolol tartrate 50 MG tablet Commonly known as: LOPRESSOR Take 1 tablet (50 mg total) by mouth 2 (two) times daily.   OneTouch Delica Plus 123456 Misc Inject 1 Units into the skin 4 (four) times daily.   OneTouch Verio test strip Generic drug: glucose blood USE TO CHECK BLOOD SUGAR 4 TIMES A DAY   oxyCODONE 5 MG immediate release tablet Commonly known as: Oxy IR/ROXICODONE Take 1 tablet (5 mg total) by mouth every 4 (four) hours as needed for up to 5 days for severe pain.   pantoprazole 40 MG tablet Commonly known as: PROTONIX Take 1 tablet (40 mg total) by mouth daily. What  changed: how much to take   saw palmetto 160 MG capsule Take 160 mg by mouth 2 (two) times daily.   tadalafil 5 MG tablet Commonly known as: Cialis TAKE 1 TABLET BY MOUTH IN  THE MORNING -DO NOT TAKE IFYOU ARE CURRENTLY TAKING   NITRATES. What changed:   how much to take  how to take this  when to take this   vitamin C 1000 MG tablet Take 1,000 mg by mouth 3 (three) times daily.   Zinc 22.5 MG Tabs Take by mouth 2 (two) times a week.      Follow-up Information    [facility] Go on 08/08/2019.   Specialty: Cardiothoracic Surgery Why: Your appointment is at 1pm.  Please arrive 30 minutes early for a chest x-ray to be performed by [facility] located on the first floor of the same building.  Contact information: [place], [place] [phone]       [facility] [place]. Go on 07/19/2019.   Specialty:  Cardiothoracic Surgery Why: You have an appointment for suture removal at 11:00am.  Contact information: [place], [place] [phone]       Buford Dresser, MD. Go on 07/27/2019.   Specialty: Cardiology Why: Your appointment is at 9:20am Contact information: [address] [place] [address] [phone]        [facility]. Go on 07/22/2019.   Specialty: Cardiology Why: You have an appointment with the [facility] on Friday,  07/22/19 at 11am.  Contact information: [address], [place] [phone]         The patient has been discharged on:   1.Beta Blocker:  Yes [ x  ]                              No   [   ]                              If No, reason:  2.Ace Inhibitor/ARB: Yes [ x  ]                                     No  [    ]                                     If No, reason:  3.Statin:   Yes [   ]                  No  [ x  ]                  If No, reason: Intolerance   4.Shela CommonsVelta Addison  [ x  ]                  No   [   ]                  If No, reason:    Signed: Antony Odea, PA-C 07/11/2019, 10:59 AM

## 2019-07-08 NOTE — Discharge Instructions (Signed)
.  Discharge Instructions:  1. You may shower, please wash incisions daily with soap and water and keep dry.  If you wish to cover wounds with dressing you may do so but please keep clean and change daily.  No tub baths or swimming until incisions have completely healed.  If your incisions become red or develop any drainage please call our office at [phone]  2. No Driving until cleared by Owen's office and you are no longer using narcotic pain medications  3. Monitor your weight daily.. Please use the same scale and weigh at same time... If you gain 5-10 lbs in 48 hours with associated lower extremity swelling, please contact our office at [phone]  4. Fever of 101.5 for at least 24 hours with no source, please contact our office at [phone]  5. Activity- up as tolerated, please walk at least 3 times per day.  Avoid strenuous activity, no lifting, pushing, or pulling with your arms over 8-10 lbs for a minimum of 6 weeks  6. If any questions or concerns arise, please do not hesitate to contact our office at [phone]

## 2019-07-09 ENCOUNTER — Inpatient Hospital Stay (HOSPITAL_COMMUNITY): Payer: Managed Care, Other (non HMO)

## 2019-07-09 LAB — GLUCOSE, CAPILLARY
Glucose-Capillary: 121 mg/dL — ABNORMAL HIGH (ref 70–99)
Glucose-Capillary: 151 mg/dL — ABNORMAL HIGH (ref 70–99)
Glucose-Capillary: 138 mg/dL — ABNORMAL HIGH (ref 70–99)
Glucose-Capillary: 115 mg/dL — ABNORMAL HIGH (ref 70–99)
Glucose-Capillary: 141 mg/dL — ABNORMAL HIGH (ref 70–99)

## 2019-07-09 LAB — BASIC METABOLIC PANEL
Anion gap: 8 (ref 5–15)
BUN: 15 mg/dL (ref 8–23)
CO2: 25 mmol/L (ref 22–32)
Calcium: 8.6 mg/dL — ABNORMAL LOW (ref 8.9–10.3)
Chloride: 102 mmol/L (ref 98–111)
Creatinine, Ser: 0.91 mg/dL (ref 0.61–1.24)
GFR calc Af Amer: >60 mL/min (ref >60)
GFR calc non Af Amer: >60 mL/min (ref >60)
Glucose, Bld: 128 mg/dL — ABNORMAL HIGH (ref 70–99)
Potassium: 3.9 mmol/L (ref 3.5–5.1)
Sodium: 135 mmol/L (ref 135–145)

## 2019-07-09 LAB — CBC
HCT: 30.8 % — ABNORMAL LOW (ref 39.0–52.0)
Hemoglobin: 10 g/dL — ABNORMAL LOW (ref 13.0–17.0)
MCH: 28.7 pg (ref 26.0–34.0)
MCHC: 32.5 g/dL (ref 30.0–36.0)
MCV: 88.3 fL (ref 80.0–100.0)
Platelets: 133 10*3/uL — ABNORMAL LOW (ref 150–400)
RBC: 3.49 MIL/uL — ABNORMAL LOW (ref 4.22–5.81)
RDW: 14.3 % (ref 11.5–15.5)
WBC: 11.5 10*3/uL — ABNORMAL HIGH (ref 4.0–10.5)
nRBC: 0 % (ref 0.0–0.2)

## 2019-07-09 MED ORDER — METOPROLOL TARTRATE 25 MG PO TABS
25.0000 mg | ORAL_TABLET | Freq: Two times a day (BID) | ORAL | Status: DC
  Administered 2019-07-09 – 2019-07-11 (×5): 25 mg via ORAL
  Filled 2019-07-09 (×5): qty 1

## 2019-07-09 MED ORDER — FUROSEMIDE 40 MG PO TABS
40.0000 mg | ORAL_TABLET | Freq: Every day | ORAL | Status: DC
  Administered 2019-07-10: 40 mg via ORAL
  Filled 2019-07-09: qty 1

## 2019-07-09 MED ORDER — POTASSIUM CHLORIDE 10 MEQ/50ML IV SOLN
10.0000 meq | INTRAVENOUS | Status: AC
  Administered 2019-07-09 (×3): 10 meq via INTRAVENOUS
  Filled 2019-07-09: qty 50

## 2019-07-09 MED ORDER — POTASSIUM CHLORIDE CRYS ER 20 MEQ PO TBCR
20.0000 meq | EXTENDED_RELEASE_TABLET | Freq: Every day | ORAL | Status: DC
  Administered 2019-07-10: 20 meq via ORAL
  Filled 2019-07-09: qty 1

## 2019-07-09 MED ORDER — ~~LOC~~ CARDIAC SURGERY, PATIENT & FAMILY EDUCATION: Freq: Once | Status: DC

## 2019-07-09 MED ORDER — FUROSEMIDE 40 MG PO TABS: 40.0000 mg | ORAL_TABLET | Freq: Every day | ORAL | Status: DC

## 2019-07-09 MED ORDER — INSULIN ASPART 100 UNIT/ML ~~LOC~~ SOLN
0.0000 [IU] | Freq: Three times a day (TID) | SUBCUTANEOUS | Status: DC
  Administered 2019-07-09 (×2): 2 [IU] via SUBCUTANEOUS
  Administered 2019-07-10: 4 [IU] via SUBCUTANEOUS
  Administered 2019-07-10: 2 [IU] via SUBCUTANEOUS

## 2019-07-09 MED ORDER — FUROSEMIDE 10 MG/ML IJ SOLN
20.0000 mg | Freq: Two times a day (BID) | INTRAMUSCULAR | Status: AC
  Administered 2019-07-09 (×2): 20 mg via INTRAVENOUS
  Filled 2019-07-09 (×2): qty 2

## 2019-07-09 MED ORDER — KETOROLAC TROMETHAMINE 15 MG/ML IJ SOLN
15.0000 mg | Freq: Four times a day (QID) | INTRAMUSCULAR | Status: AC
  Administered 2019-07-09 – 2019-07-10 (×4): 15 mg via INTRAVENOUS
  Filled 2019-07-09 (×4): qty 1

## 2019-07-09 NOTE — Progress Notes (Signed)
      [address]       [place] [address]             [phone]        CARDIOTHORACIC SURGERY PROGRESS NOTE   R2 Days Post-Op Procedure(s) (LRB): CORONARY ARTERY BYPASS GRAFTING (CABG) using LIMA to LAD; Free IMA to RCA. (N/A) TRANSESOPHAGEAL ECHOCARDIOGRAM (TEE) (N/A) Endovein Harvest Of Greater Saphenous Vein (Right)  Subjective: Looks good.  Slept some.  Mild soreness.  Mild nausea.  Objective: Vital signs: BP Readings from Last 1 Encounters:  07/09/19 (!) 142/78   Pulse Readings from Last 1 Encounters:  07/09/19 (!) 103   Resp Readings from Last 1 Encounters:  07/09/19 19   Temp Readings from Last 1 Encounters:  07/09/19 98.4 F (36.9 C)    Hemodynamics: CVP:  [10 mmHg-17 mmHg] 17 mmHg  Physical Exam:  Rhythm:   sinus  Breath sounds: clear  Heart sounds:  RRR  Incisions:  Dressings dry, intact  Abdomen:  Soft, non-distended, non-tender  Extremities:  Warm, well-perfused  Chest tubes:  low volume thin serosanguinous output, no air leak    Intake/Output from previous day: 05/14 0701 - 05/15 0700 In: 166.8 [I.V.:40.4; IV Piggyback:126.4] Out: 1620 [Urine:1050; Chest Tube:570] Intake/Output this shift: No intake/output data recorded.  Lab Results:  CBC: Recent Labs    07/08/19 1659 07/09/19 0340  WBC 13.2* 11.5*  HGB 11.1* 10.0*  HCT 33.3* 30.8*  PLT 152 133*    BMET:  Recent Labs    07/08/19 1659 07/09/19 0340  NA 135 135  K 4.3 3.9  CL 103 102  CO2 21* 25  GLUCOSE 179* 128*  BUN 18 15  CREATININE 0.99 0.91  CALCIUM 8.4* 8.6*     PT/INR:   Recent Labs    07/07/19 1500  LABPROT 14.9  INR 1.2    CBG (last 3)  Recent Labs    07/08/19 2344 07/09/19 0352 07/09/19 0717  GLUCAP 129* 121* 151*    ABG    Component Value Date/Time   PHART 7.324 (L) 07/07/2019 2109   PCO2ART 40.5 07/07/2019 2109   PO2ART 135 (H) 07/07/2019 2109   HCO3 21.2 07/07/2019 2109   TCO2 22 07/07/2019 2109   ACIDBASEDEF 5.0  (H) 07/07/2019 2109   O2SAT 99.0 07/07/2019 2109    CXR: Clear, mild bibasilar ATX  Assessment/Plan: S/P Procedure(s) (LRB): CORONARY ARTERY BYPASS GRAFTING (CABG) using LIMA to LAD; Free IMA to RCA. (N/A) TRANSESOPHAGEAL ECHOCARDIOGRAM (TEE) (N/A) Endovein Harvest Of Greater Saphenous Vein (Right)  Doing well POD1 Mobilize Gentle diuresis D/C tubes and lines DAPT Increase beta blocker Consider restarting ARB soon depending on BP Zetia - patient intolerant of all Statin drugs - will need referral to [place], MD 07/09/2019 8:09 AM

## 2019-07-10 ENCOUNTER — Inpatient Hospital Stay (HOSPITAL_COMMUNITY): Payer: Managed Care, Other (non HMO)

## 2019-07-10 LAB — CBC
HCT: 30.2 % — ABNORMAL LOW (ref 39.0–52.0)
Hemoglobin: 9.9 g/dL — ABNORMAL LOW (ref 13.0–17.0)
MCH: 28.8 pg (ref 26.0–34.0)
MCHC: 32.8 g/dL (ref 30.0–36.0)
MCV: 87.8 fL (ref 80.0–100.0)
Platelets: 152 10*3/uL (ref 150–400)
RBC: 3.44 MIL/uL — ABNORMAL LOW (ref 4.22–5.81)
RDW: 14.1 % (ref 11.5–15.5)
WBC: 10.1 10*3/uL (ref 4.0–10.5)
nRBC: 0 % (ref 0.0–0.2)

## 2019-07-10 LAB — BASIC METABOLIC PANEL
Anion gap: 9 (ref 5–15)
BUN: 24 mg/dL — ABNORMAL HIGH (ref 8–23)
CO2: 27 mmol/L (ref 22–32)
Calcium: 8.7 mg/dL — ABNORMAL LOW (ref 8.9–10.3)
Chloride: 99 mmol/L (ref 98–111)
Creatinine, Ser: 1.21 mg/dL (ref 0.61–1.24)
GFR calc Af Amer: >60 mL/min (ref >60)
GFR calc non Af Amer: >60 mL/min (ref >60)
Glucose, Bld: 117 mg/dL — ABNORMAL HIGH (ref 70–99)
Potassium: 3.9 mmol/L (ref 3.5–5.1)
Sodium: 135 mmol/L (ref 135–145)

## 2019-07-10 LAB — POCT I-STAT 7, (LYTES, BLD GAS, ICA,H+H)
Acid-base deficit: 2 mmol/L (ref 0.0–2.0)
Bicarbonate: 24.1 mmol/L (ref 20.0–28.0)
Calcium, Ion: 1.2 mmol/L (ref 1.15–1.40)
HCT: 37 % — ABNORMAL LOW (ref 39.0–52.0)
Hemoglobin: 12.6 g/dL — ABNORMAL LOW (ref 13.0–17.0)
O2 Saturation: 100 %
Patient temperature: 98.5
Potassium: 4.5 mmol/L (ref 3.5–5.1)
Sodium: 140 mmol/L (ref 135–145)
TCO2: 25 mmol/L (ref 22–32)
pCO2 arterial: 43.7 mmHg (ref 32.0–48.0)
pH, Arterial: 7.35 (ref 7.350–7.450)
pO2, Arterial: 193 mmHg — ABNORMAL HIGH (ref 83.0–108.0)

## 2019-07-10 LAB — GLUCOSE, CAPILLARY
Glucose-Capillary: 120 mg/dL — ABNORMAL HIGH (ref 70–99)
Glucose-Capillary: 139 mg/dL — ABNORMAL HIGH (ref 70–99)
Glucose-Capillary: 167 mg/dL — ABNORMAL HIGH (ref 70–99)
Glucose-Capillary: 113 mg/dL — ABNORMAL HIGH (ref 70–99)

## 2019-07-10 MED ORDER — IRBESARTAN 150 MG PO TABS
150.0000 mg | ORAL_TABLET | Freq: Every day | ORAL | Status: DC
  Administered 2019-07-10 – 2019-07-11 (×2): 150 mg via ORAL
  Filled 2019-07-10 (×2): qty 1

## 2019-07-10 NOTE — Progress Notes (Addendum)
      [address]       [place] [address]             [phone]      3 Days Post-Op Procedure(s) (LRB): CORONARY ARTERY BYPASS GRAFTING (CABG) using LIMA to LAD; Free IMA to RCA. (N/A) TRANSESOPHAGEAL ECHOCARDIOGRAM (TEE) (N/A) Endovein Harvest Of Greater Saphenous Vein (Right)   Subjective:  Doing well.  Denies pain, N/V... No BM yet,   + ambulation  Objective: Vital signs in last 24 hours: Temp:  [98.4 F (36.9 C)-98.6 F (37 C)] 98.6 F (37 C) (05/16 0400) Pulse Rate:  [86-103] 94 (05/16 0400) Cardiac Rhythm: Normal sinus rhythm (05/16 0400) Resp:  [12-18] 16 (05/16 0400) BP: (122-147)/(78-87) 131/79 (05/16 0400) SpO2:  [93 %-98 %] 96 % (05/16 0400) Weight:  [94.7 kg] 94.7 kg (05/16 0601)  Intake/Output from previous day: 05/15 0701 - 05/16 0700 In: 500.2 [P.O.:360; I.V.:10; IV Piggyback:130.2] Out: 700 [Urine:700]  General appearance: alert, cooperative and no distress Heart: regular rate and rhythm Lungs: clear to auscultation bilaterally Abdomen: soft, non-tender; bowel sounds normal; no masses,  no organomegaly Extremities: edema trace Wound: clean and dry  Lab Results: Recent Labs    07/09/19 0340 07/10/19 0305  WBC 11.5* 10.1  HGB 10.0* 9.9*  HCT 30.8* 30.2*  PLT 133* 152   BMET:  Recent Labs    07/09/19 0340 07/10/19 0305  NA 135 135  K 3.9 3.9  CL 102 99  CO2 25 27  GLUCOSE 128* 117*  BUN 15 24*  CREATININE 0.91 1.21  CALCIUM 8.6* 8.7*    PT/INR:  Recent Labs    07/07/19 1500  LABPROT 14.9  INR 1.2   ABG    Component Value Date/Time   PHART 7.324 (L) 07/07/2019 2109   HCO3 21.2 07/07/2019 2109   TCO2 22 07/07/2019 2109   ACIDBASEDEF 5.0 (H) 07/07/2019 2109   O2SAT 99.0 07/07/2019 2109   CBG (last 3)  Recent Labs    07/09/19 1633 07/09/19 2120 07/10/19 0627  GLUCAP 115* 141* 120*    Assessment/Plan: S/P Procedure(s) (LRB): CORONARY ARTERY BYPASS GRAFTING (CABG) using LIMA to LAD; Free IMA to  RCA. (N/A) TRANSESOPHAGEAL ECHOCARDIOGRAM (TEE) (N/A) Endovein Harvest Of Greater Saphenous Vein (Right)  1. CV- NSR, BP is elevated- continue Lopressor, will start Avapro at 150 mg daily, which is half dose of home Benicar 2. Pulm- no acute issues, CXR is free from pleural effusion/pneumothorax, continue IS 3. Renal- creatinine at 1.20, weight is trending down, I/O are negative, may need to stop Lasix if creatinine increases further 4. Expected post operative blood loss anemia, mild Hgb at 9.9 5. DM- cbgs controlled, continue SSIP 6. Dispo- patient stable, add ARB for BP control, repeat BMET in AM Creatinine increases further will need to stop Lasix, EPW out in AM, continue current care   LOS: 5 days    Ellwood Handler, PA-C  07/10/2019   I have seen and examined the patient and agree with the assessment and plan as outlined.  Doing very well.  Anticipate possible d/c home tomorrow  Rexene Alberts, MD 07/10/2019 12:16 PM

## 2019-07-10 NOTE — Progress Notes (Signed)
EPW removed per order. Pt tolerated well, ends intact. Pt instructed to stay on bed rest x 1 hour. Pt voiced understanding. VSS. Will continue to monitor. Amanda Cockayne, RN

## 2019-07-11 ENCOUNTER — Telehealth: Payer: Self-pay | Admitting: Physician Assistant

## 2019-07-11 LAB — BASIC METABOLIC PANEL
Anion gap: 8 (ref 5–15)
BUN: 20 mg/dL (ref 8–23)
CO2: 26 mmol/L (ref 22–32)
Calcium: 8.4 mg/dL — ABNORMAL LOW (ref 8.9–10.3)
Chloride: 104 mmol/L (ref 98–111)
Creatinine, Ser: 1.1 mg/dL (ref 0.61–1.24)
GFR calc Af Amer: >60 mL/min (ref >60)
GFR calc non Af Amer: >60 mL/min (ref >60)
Glucose, Bld: 130 mg/dL — ABNORMAL HIGH (ref 70–99)
Potassium: 3.6 mmol/L (ref 3.5–5.1)
Sodium: 138 mmol/L (ref 135–145)

## 2019-07-11 LAB — GLUCOSE, CAPILLARY: Glucose-Capillary: 113 mg/dL — ABNORMAL HIGH (ref 70–99)

## 2019-07-11 MED ORDER — POTASSIUM CHLORIDE CRYS ER 20 MEQ PO TBCR
40.0000 meq | EXTENDED_RELEASE_TABLET | Freq: Once | ORAL | Status: AC
  Administered 2019-07-11: 40 meq via ORAL
  Filled 2019-07-11: qty 2

## 2019-07-11 MED ORDER — METOPROLOL TARTRATE 50 MG PO TABS: 50.0000 mg | ORAL_TABLET | Freq: Two times a day (BID) | ORAL | 2 refills | Status: DC

## 2019-07-11 MED ORDER — ASPIRIN 81 MG PO TBEC: 81.0000 mg | DELAYED_RELEASE_TABLET | Freq: Every day | ORAL | Status: DC

## 2019-07-11 MED ORDER — CLOPIDOGREL BISULFATE 75 MG PO TABS: 75.0000 mg | ORAL_TABLET | Freq: Every day | ORAL | 2 refills | Status: DC

## 2019-07-11 MED ORDER — POTASSIUM CHLORIDE CRYS ER 20 MEQ PO TBCR: 40.0000 meq | EXTENDED_RELEASE_TABLET | Freq: Once | ORAL | Status: DC

## 2019-07-11 MED ORDER — OXYCODONE HCL 5 MG PO TABS: 5.0000 mg | ORAL_TABLET | ORAL | 0 refills | Status: AC | PRN

## 2019-07-11 MED FILL — Heparin Sodium (Porcine) Inj 1000 Unit/ML: INTRAMUSCULAR | Qty: 2500 | Status: AC

## 2019-07-11 NOTE — Progress Notes (Signed)
[id number] Pt has been walking independently with walker. Has walker at home if needed but does not think will need at home. Discussed importance of IS and walking for exercise. Gave walking instructions and also discussed CRP 2. Referred to [place] CRP 2. Discussed sternal precautions and staying in the tube. Encouraged IS. Gave diabetic and heart healthy diets. Pt has been strict with his diet prior to admission. A1c at 6.3 with diet. Pt voiced understanding of ed. Graylon Good RN BSN 07/11/2019 8:59 AM

## 2019-07-11 NOTE — Progress Notes (Addendum)
4 Days Post-Op Procedure(s) (LRB): CORONARY ARTERY BYPASS GRAFTING (CABG) using LIMA to LAD; Free IMA to RCA. (N/A) TRANSESOPHAGEAL ECHOCARDIOGRAM (TEE) (N/A) Endovein Harvest Of Greater Saphenous Vein (Right) Subjective: Sitting up eating breakfast. Says he feels good and has no concerns.  BM yesterday.   Objective: Vital signs in last 24 hours: Temp:  [98.1 F (36.7 C)-98.8 F (37.1 C)] 98.1 F (36.7 C) (05/17 0417) Pulse Rate:  [86-102] 91 (05/17 0417) Cardiac Rhythm: Normal sinus rhythm (05/17 0415) Resp:  [13-23] 18 (05/17 0630) BP: (120-153)/(69-92) 145/92 (05/17 0417) SpO2:  [94 %-100 %] 97 % (05/17 0417) Weight:  [91.4 kg] 91.4 kg (05/17 0630)     Intake/Output from previous day: 05/16 0701 - 05/17 0700 In: 120 [P.O.:120] Out: 600 [Urine:600] Intake/Output this shift: No intake/output data recorded.  General appearance: alert, cooperative and no distress Neurologic: intact Heart: S.tach 110-120. Lungs: BS clear Extremities: No edema, right LE ext EVH incision is intact and dry.  Wound: the sternal incision is open to air and is clean and dry.   Lab Results: Recent Labs    07/09/19 0340 07/10/19 0305  WBC 11.5* 10.1  HGB 10.0* 9.9*  HCT 30.8* 30.2*  PLT 133* 152   BMET:  Recent Labs    07/10/19 0305 07/11/19 0254  NA 135 138  K 3.9 3.6  CL 99 104  CO2 27 26  GLUCOSE 117* 130*  BUN 24* 20  CREATININE 1.21 1.10  CALCIUM 8.7* 8.4*    PT/INR: No results for input(s): LABPROT, INR in the last 72 hours. ABG    Component Value Date/Time   PHART 7.324 (L) 07/07/2019 2109   HCO3 21.2 07/07/2019 2109   TCO2 22 07/07/2019 2109   ACIDBASEDEF 5.0 (H) 07/07/2019 2109   O2SAT 99.0 07/07/2019 2109   CBG (last 3)  Recent Labs    07/10/19 1613 07/10/19 2108 07/11/19 0615  GLUCAP 167* 113* 113*    Assessment/Plan: S/P Procedure(s) (LRB): CORONARY ARTERY BYPASS GRAFTING (CABG) using LIMA to LAD; Free IMA to RCA. (N/A) TRANSESOPHAGEAL  ECHOCARDIOGRAM (TEE) (N/A) Endovein Harvest Of Greater Saphenous Vein (Right)  -POD4 CABG x 2. Doing well, independent with mobility. Tolerating PO's.Continue ASA, statin.   -S.Tachycardia- Mostly NSR past 24 hours in 80's-90's. Now ~120. Also mildly hypertensive. Up-titrate metoprolol.   -EBL anemia- mild and stable.   -Type 2 DM- glucose well controlled on SSI  -DVT PPX- on [place] Lovenox daily.   Disposition- Possible discharge later today or in AM.   LOS: 6 days    Antony Odea, PA-C [phone] 07/11/2019   I have seen and examined the patient and agree with the assessment and plan as outlined.  Increase metoprolol and restart home ARB.  D/C home.  Needs appt in lipid clinic due to intolerance of statins.  Rexene Alberts, MD 07/11/2019 8:38 AM

## 2019-07-11 NOTE — Progress Notes (Signed)
Discharge AVS meds take and those due reviewed with pt. Follow up appointments and when to call MD reviewed. All questions and concerns addressed. No further questions at this time. D/c IV and TELE, CCMD notified. D/C home per orders. Michala N Ponsolle, RN  

## 2019-07-12 ENCOUNTER — Telehealth (HOSPITAL_COMMUNITY): Payer: Self-pay

## 2019-07-12 ENCOUNTER — Telehealth: Payer: Self-pay | Admitting: *Deleted

## 2019-07-12 NOTE — Telephone Encounter (Signed)
Pt was on TCM report admitted 07/05/15 for  recent onset symptoms accelerating chest pain culminating in a prolonged episode of chest pain . The patient was taken to the operating room on 07/07/2019.  He underwent CABG x 2 utilizing LIMA to LAD and Free RIMA to distal RCA.  He also underwent endoscopic harvest of greater saphenous vein from the right thigh.  He tolerated the procedure without difficulty and was taken to the SICU in stable condition. Pt D/C 07/11/19, and will follow-up w/ Buford Dresser, MD (Cardiology) on 07/27/2019 and thoracic surgeon 08/08/19..,Corey [name]

## 2019-07-12 NOTE — Telephone Encounter (Signed)
Faxed referral to [facility] for Cardiac Rehab.

## 2019-07-14 MED FILL — Heparin Sodium (Porcine) Inj 1000 Unit/ML: INTRAMUSCULAR | Qty: 20 | Status: AC

## 2019-07-14 MED FILL — Electrolyte-R (PH 7.4) Solution: INTRAVENOUS | Qty: 4000 | Status: AC

## 2019-07-14 MED FILL — Sodium Bicarbonate IV Soln 8.4%: INTRAVENOUS | Qty: 50 | Status: AC

## 2019-07-14 MED FILL — Lidocaine HCl Local Soln Prefilled Syringe 100 MG/5ML (2%): INTRAMUSCULAR | Qty: 5 | Status: AC

## 2019-07-14 MED FILL — Mannitol IV Soln 20%: INTRAVENOUS | Qty: 500 | Status: AC

## 2019-07-14 MED FILL — Sodium Chloride IV Soln 0.9%: INTRAVENOUS | Qty: 2000 | Status: AC

## 2019-07-19 ENCOUNTER — Encounter (INDEPENDENT_AMBULATORY_CARE_PROVIDER_SITE_OTHER): Payer: Self-pay

## 2019-07-19 ENCOUNTER — Other Ambulatory Visit: Payer: Self-pay

## 2019-07-19 DIAGNOSIS — Z4802 Encounter for removal of sutures: Secondary | ICD-10-CM

## 2019-07-22 ENCOUNTER — Ambulatory Visit: Payer: Managed Care, Other (non HMO)

## 2019-07-27 ENCOUNTER — Ambulatory Visit: Payer: Managed Care, Other (non HMO) | Admitting: Cardiology

## 2019-07-27 ENCOUNTER — Encounter: Payer: Self-pay | Admitting: Cardiology

## 2019-07-27 ENCOUNTER — Other Ambulatory Visit: Payer: Self-pay

## 2019-07-27 ENCOUNTER — Telehealth: Payer: Self-pay

## 2019-07-27 VITALS — BP 96/76 | HR 76 | Ht 73.0 in | Wt 208.0 lb

## 2019-07-27 DIAGNOSIS — I2510 Atherosclerotic heart disease of native coronary artery without angina pectoris: Secondary | ICD-10-CM | POA: Diagnosis not present

## 2019-07-27 DIAGNOSIS — Z951 Presence of aortocoronary bypass graft: Secondary | ICD-10-CM

## 2019-07-27 DIAGNOSIS — E7800 Pure hypercholesterolemia, unspecified: Secondary | ICD-10-CM

## 2019-07-27 DIAGNOSIS — I10 Essential (primary) hypertension: Secondary | ICD-10-CM

## 2019-07-27 DIAGNOSIS — Z789 Other specified health status: Secondary | ICD-10-CM

## 2019-07-27 DIAGNOSIS — Z7189 Other specified counseling: Secondary | ICD-10-CM

## 2019-07-27 DIAGNOSIS — I255 Ischemic cardiomyopathy: Secondary | ICD-10-CM

## 2019-07-27 DIAGNOSIS — Z79899 Other long term (current) drug therapy: Secondary | ICD-10-CM

## 2019-07-27 MED ORDER — METOPROLOL SUCCINATE ER 25 MG PO TB24: 25.0000 mg | ORAL_TABLET | Freq: Every day | ORAL | 3 refills | Status: DC

## 2019-07-27 NOTE — Progress Notes (Signed)
Cardiology Office Note:    Date:  07/27/2019   ID:  Corey [name], DOB 11/22/1950, MRN [id number]  PCP:  Cassandria Anger, MD  Cardiologist:  Buford Dresser, MD  Referring MD: Cassandria Anger, MD   CC: post hospital follow up  History of Present Illness:    Corey [name] is a 69 y.o. male with a hx of CAD s/p CABG, hypertension, type II diabetes, dyslipidemia, recurrent kidney stones who is seen for follow up today. He was initially seen 06/16/19 as a new consult at the request of Plotnikov, Evie Lacks, MD for the evaluation and management of elevated calcium score.  Today: Seen post CABG hospitalization. Reviewed series of events, from acute chest pain to cath to CABG.  Having two issues: pruritis on his face, cough/hoarse. Clear phlegm. Hasn't done cardiac rehab, but he is back to walking. When he lays down he feels his heart beat, not fast/painful but more noticeable. Discussed anatomy changes post CABG.  Has had face pruritis in the past, treated with hydroxyzine. Affects his sleep. No rash or visible skin changes. Has a history of frequent/recurrent hives.   Discussed PCSK9i at length today, he is amenable.   Home BP has been occasionally low, 90s/60s. Already taking metoprolol 25 mg twice a day instead of 50 mg twice a day on his own. Was dizzy on full dose. Wants to be off beta blocker in 6 mos. Also discussed dropping amlodipine from combo pill if BP remains low.  Denies chest pain, shortness of breath at rest or with normal exertion. No PND, orthopnea, LE edema or unexpected weight gain. No syncope or palpitations.  Past Medical History:  Diagnosis Date   Allergic rhinitis    Allergy    Arthritis    Asthma    Barrett's esophagus    Cataract    removed both eyes with lens replacement Dr Katy Fitch   Complication of anesthesia    "took them 6 hours to wake me up"   Diabetes mellitus without complication (HCC)    GERD (gastroesophageal reflux  disease)    Heart murmur    History of kidney stones    HTN (hypertension)    Hyperlipidemia    Pneumonia    as a kid    Past Surgical History:  Procedure Laterality Date   CATARACT EXTRACTION, BILATERAL     with lens implants    COLONOSCOPY  01/15/2009   normal    CORONARY ARTERY BYPASS GRAFT N/A 07/07/2019   Procedure: CORONARY ARTERY BYPASS GRAFTING (CABG) using LIMA to LAD; Free IMA to RCA.;  Surgeon: Rexene Alberts, MD;  Location: [place];  Service: Open Heart Surgery;  Laterality: N/A;  BILATERAL IMA   ENDOVEIN HARVEST OF GREATER SAPHENOUS VEIN Right 07/07/2019   Procedure: [place] Of Greater Saphenous Vein;  Surgeon: Rexene Alberts, MD;  Location: [place];  Service: Open Heart Surgery;  Laterality: Right;   ESOPHAGOGASTRODUODENOSCOPY  03/20/2006   EYE SURGERY     KIDNEY STONE SURGERY     x 4 - most recent 04-2018 at [facility]    LEFT HEART CATH AND CORONARY ANGIOGRAPHY N/A 07/05/2019   Procedure: LEFT HEART CATH AND CORONARY ANGIOGRAPHY;  Surgeon: [name], Peter M, MD;  Location: [place] [facility];  Service: Cardiovascular;  Laterality: N/A;   TEE WITHOUT CARDIOVERSION N/A 07/07/2019   Procedure: TRANSESOPHAGEAL ECHOCARDIOGRAM (TEE);  Surgeon: Rexene Alberts, MD;  Location: [place];  Service: Open Heart Surgery;  Laterality:  N/A;   TONSILLECTOMY     TOTAL HIP ARTHROPLASTY Right 04/30/2016   Procedure: RIGHT TOTAL HIP ARTHROPLASTY ANTERIOR APPROACH;  Surgeon: Gaynelle Arabian, MD;  Location: [facility];  Service: Orthopedics;  Laterality: Right;  requests 136mins   TRANSTHORACIC ECHOCARDIOGRAM  01/05/1998   UPPER GASTROINTESTINAL ENDOSCOPY      Current Medications: Current Outpatient Medications on File Prior to Visit  Medication Sig   acetaminophen (TYLENOL) 650 MG CR tablet Take 650 mg by mouth every 8 (eight) hours as needed for pain.   amLODipine-olmesartan (AZOR) 5-40 MG tablet TAKE ONE-HALF (1/2) TABLET BY MOUTH DAILY (Patient taking  differently: Take 0.5 tablets by mouth daily. TAKE ONE-HALF (1/2) TABLET BY MOUTH DAILY)   Ascorbic Acid (VITAMIN C) 1000 MG tablet Take 1,000 mg by mouth 3 (three) times daily.   aspirin EC 81 MG EC tablet Take 1 tablet (81 mg total) by mouth daily.   cetirizine (ZYRTEC) 10 MG tablet Take 10 mg by mouth 2 (two) times daily. Takes twice daily   cholecalciferol (VITAMIN D3) 25 MCG (1000 UT) tablet Take 5,000 Units by mouth daily.   clopidogrel (PLAVIX) 75 MG tablet Take 1 tablet (75 mg total) by mouth daily.   ezetimibe (ZETIA) 10 MG tablet Take 1 tablet (10 mg total) by mouth daily.   Magnesium 400 MG CAPS Take by mouth 3 (three) times daily.   metoprolol tartrate (LOPRESSOR) 50 MG tablet Take 1 tablet (50 mg total) by mouth 2 (two) times daily.   pantoprazole (PROTONIX) 40 MG tablet Take 1 tablet (40 mg total) by mouth daily. (Patient taking differently: Take 20 mg by mouth daily. )   saw palmetto 160 MG capsule Take 160 mg by mouth 2 (two) times daily.   tadalafil (CIALIS) 5 MG tablet TAKE 1 TABLET BY MOUTH IN  THE MORNING -DO NOT TAKE IFYOU ARE CURRENTLY TAKING   NITRATES. (Patient taking differently: Take 5 mg by mouth daily. TAKE 1 TABLET BY MOUTH IN  THE MORNING -DO NOT TAKE IFYOU ARE CURRENTLY TAKING   NITRATES.)   Zinc 22.5 MG TABS Take by mouth 2 (two) times a week.   No current facility-administered medications on file prior to visit.     Allergies:   Albuterol, Aspirin, Ciprofloxacin, Clarithromycin, Colesevelam, Enalapril maleate, Esomeprazole magnesium, Niacin, Pravastatin sodium, Rosuvastatin, and Penicillins   Social History   Tobacco Use   Smoking status: Never Smoker   Smokeless tobacco: Never Used  Substance Use Topics   Alcohol use: No   Drug use: No    Family History: family history includes Colon polyps in his mother; Diabetes in his mother; Heart disease in his father and mother; Hypertension in an other family member. There is no history of Colon  cancer, Esophageal cancer, Rectal cancer, or Stomach cancer.  ROS:   Please see the history of present illness.  Additional pertinent ROS otherwise unremarkable.  EKGs/Labs/Other Studies Reviewed:    The following studies were reviewed today: Echo 07/06/19 1. Left ventricular ejection fraction, by estimation, is 45 to 50%. The  left ventricle has mildly decreased function. The left ventricle has no  regional wall motion abnormalities. Left ventricular diastolic parameters  are consistent with Grade I  diastolic dysfunction (impaired relaxation).  2. Right ventricular systolic function is normal. The right ventricular  size is normal.  3. The mitral valve is normal in structure. No evidence of mitral valve  regurgitation. No evidence of mitral stenosis.  4. The aortic valve is normal in structure. Aortic  valve regurgitation is  not visualized. No aortic stenosis is present.  5. The inferior vena cava is normal in size with greater than 50%  respiratory variability, suggesting right atrial pressure of 3 mmHg.   Cath 07/05/19  Mid LM to Dist LM lesion is 30% stenosed.  Ost LAD to Prox LAD lesion is 90% stenosed.  Prox RCA lesion is 100% stenosed.  The left ventricular systolic function is normal.  LV end diastolic pressure is normal.  The left ventricular ejection fraction is 50-55% by visual estimate.  There is no mitral valve regurgitation.   1. Severe 2 vessel CAD     - 90% ostial LAD stenosis. Heavily calcified    - 100% proximal RCA with left to right collateral. Vessel is also heavily calcified. 2. Inferior hypokinesis with overall well preserved LV function 3. Normal LVEDP  Plan: patient has complex CAD with total occlusion of a large RCA with collaterals and ostial LAD which supplies a large LAD and large diagonal. It would be difficult to manage this with PCI since stenting of the LAD would require jailing the LCx and could compromise this vessel. I would  recommend consideration for CABG. CT surgery consulted.    Calcium score 06/08/19 Coronary calcium score of 1275. This was 76 percentile for age and sex matched control.  EKG:  EKG is personally reviewed.  The ekg ordered today demonstrates NSR at 76 bpm  Recent Labs: 07/06/2019: ALT 51; TSH 1.672 07/08/2019: Magnesium 2.0 07/10/2019: Hemoglobin 9.9; Platelets 152 07/11/2019: BUN 20; Creatinine, Ser 1.10; Potassium 3.6; Sodium 138  Recent Lipid Panel    Component Value Date/Time   CHOL 186 05/16/2019 0855   TRIG 93.0 05/16/2019 0855   HDL 43.90 05/16/2019 0855   CHOLHDL 4 05/16/2019 0855   VLDL 18.6 05/16/2019 0855   LDLCALC 123 (H) 05/16/2019 0855   LDLDIRECT 191.5 04/15/2013 0732   LDLDIRECT 193.6 04/15/2013 0732    Physical Exam:    VS:  BP 96/76    Pulse 76    Ht 6\' 1"  (1.854 m)    Wt 208 lb (94.3 kg)    SpO2 95%    BMI 27.44 kg/m     Wt Readings from Last 3 Encounters:  07/27/19 208 lb (94.3 kg)  07/11/19 201 lb 8 oz (91.4 kg)  06/16/19 215 lb 12.8 oz (97.9 kg)    GEN: Well nourished, well developed in no acute distress HEENT: Normal, moist mucous membranes NECK: No JVD CARDIAC: regular rhythm, normal S1 and S2, no rubs or gallops. No murmur. Well healed surgical incision. VASCULAR: Radial and DP pulses 2+ bilaterally. No carotid bruits RESPIRATORY:  Clear to auscultation without rales, wheezing or rhonchi  ABDOMEN: Soft, non-tender, non-distended MUSCULOSKELETAL:  Ambulates independently SKIN: Warm and dry, no edema NEUROLOGIC:  Alert and oriented x 3. No focal neuro deficits noted. PSYCHIATRIC:  Normal affect   ASSESSMENT:    1. Atherosclerosis of native coronary artery of native heart without angina pectoris   2. S/P CABG x 2   3. Hypercholesteremia   4. Statin intolerance   5. Ischemic cardiomyopathy   6. Essential hypertension   7. Medication management   8. Cardiac risk counseling   9. Counseling on health promotion and disease prevention    PLAN:      CAD, now s/p CABG: initially referred for elevated calcium score. When he had chest pain, referred to ER, found to have NSTEMI. Cath as above, referred for CABG for complex CAD. -CABG 07/07/19  by Dr. Roxy Manns (LIMA to LAD, Free RIMA to RCA with Short Reversed Greater Saphenous Vein Interposition at Proximal Anastomosis) -doing well post CABG -on aspirin 81 mg -continue clopidogrel until 06/2020 given NSTEMI on presentation -was ordered for metoprolol tartrate 50 mg BID. Self decreased to 25 mg BID due to symptoms. Given EF below normal, will change to metoprolol succinate 25 mg daily. He would like to stop this completely, but with abnormal EF and CAD, would prefer to continue at low dose at least. Reassess at follow up  Cardiomyopathy, likely ischemic: pre-CABG EF 45-50% -changing to metoprolol succinate today as above -on olmesartan -repeat echo prior to discontinuing metoprolol (see patient request, above)  hypercholesterolemia, with LDL goal <70 given CAD -with recent CABG, complex CAD, and prior statin intolerance, will refer to PharmD clinic to discuss PCSK9i. -ordered fasting lipid panel  Hypertension: goal <130/80, has run lower since surgery -continue amlodipine-olmesartan, though if BP remains low he will contact me, and I would drop amlodipine -decreasing metoprolol dose as above  Type II diabetes, without obesity: -in the setting of CAD, discussed recommendation for SGLT2i and GLP1RA. He will consider if he requires daily diabetes medications in the future. My preference would be SGLT2i given likely ischemic cardiomyopathy -wishes to stay on metformin PRN at this time.  Cardiac risk counseling and prevention recommendations: -recommend heart healthy/Mediterranean diet, with whole grains, fruits, vegetable, fish, lean meats, nuts, and olive oil. Limit salt. -recommend moderate walking, 3-5 times/week for 30-50 minutes each session. Aim for at least 150 minutes.week. Goal should be  pace of 3 miles/hours, or walking 1.5 miles in 30 minutes -recommend avoidance of tobacco products. Avoid excess alcohol.  Plan for follow up: 3 mos  Buford Dresser, MD, PhD [place]   [facility]    Medication Adjustments/Labs and Tests Ordered: Current medicines are reviewed at length with the patient today.  Concerns regarding medicines are outlined above.  Orders Placed This Encounter  Procedures   Lipid panel   EKG 12-Lead   Meds ordered this encounter  Medications   metoprolol succinate (TOPROL XL) 25 MG 24 hr tablet    Sig: Take 1 tablet (25 mg total) by mouth daily.    Dispense:  90 tablet    Refill:  3    Patient Instructions  Medication Instructions:  Stop: Metoprolol tartrate 50 mg twice a day Start: Metoprolol Succinate 25 mg daily  *If you need a refill on your cardiac medications before your next appointment, please call your pharmacy*   Lab Work: Your physician recommends that you return for lab work day of lipid clinic ( fast lipid).  If you have labs (blood work) drawn today and your tests are completely normal, you will receive your results only by:  [place] (if you have MyChart) OR  A paper copy in the mail If you have any lab test that is abnormal or we need to change your treatment, we will call you to review the results.   Testing/Procedures: None   Follow-Up: At [facility], you and your health needs are our priority.  As part of our continuing mission to provide you with exceptional heart care, we have created designated Provider Care Teams.  These Care Teams include your primary Cardiologist (physician) and Advanced Practice Providers (APPs -  Physician Assistants and Nurse Practitioners) who all work together to provide you with the care you need, when you need it.  We recommend signing up for the patient portal called "MyChart".  Sign up information is provided on this After Visit Summary.  MyChart is used to  connect with patients for Virtual Visits (Telemedicine).  Patients are able to view lab/test results, encounter notes, upcoming appointments, etc.  Non-urgent messages can be sent to your provider as well.   To learn more about what you can do with MyChart, go to [email or web address].    Your next appointment:   3 month(s)  The format for your next appointment:   In Person  Provider:   Buford Dresser, MD  Your physician recommends that you schedule a follow-up appointment with [facility].     Signed, Buford Dresser, MD PhD 07/27/2019  [place]

## 2019-07-27 NOTE — Telephone Encounter (Signed)
New message    The patient saw his cardiologist, Dr. Harrell Gave, today.  The MD  advise the patient to call his PCP to discuss issues he having with the clopidogrel (PLAVIX) 75 MG tablet that caused him to itch.   The cardiologist is asking for hydrazine to help, they are unable to write this prescription.   [id number] - [place], [place]

## 2019-07-27 NOTE — Patient Instructions (Signed)
Medication Instructions:  Stop: Metoprolol tartrate 50 mg twice a day Start: Metoprolol Succinate 25 mg daily  *If you need a refill on your cardiac medications before your next appointment, please call your pharmacy*   Lab Work: Your physician recommends that you return for lab work day of lipid clinic ( fast lipid).  If you have labs (blood work) drawn today and your tests are completely normal, you will receive your results only by: Marland Kitchen MyChart Message (if you have MyChart) OR . A paper copy in the mail If you have any lab test that is abnormal or we need to change your treatment, we will call you to review the results.   Testing/Procedures: None   Follow-Up: At [facility], you and your health needs are our priority.  As part of our continuing mission to provide you with exceptional heart care, we have created designated Provider Care Teams.  These Care Teams include your primary Cardiologist (physician) and Advanced Practice Providers (APPs -  Physician Assistants and Nurse Practitioners) who all work together to provide you with the care you need, when you need it.  We recommend signing up for the patient portal called "MyChart".  Sign up information is provided on this After Visit Summary.  MyChart is used to connect with patients for Virtual Visits (Telemedicine).  Patients are able to view lab/test results, encounter notes, upcoming appointments, etc.  Non-urgent messages can be sent to your provider as well.   To learn more about what you can do with MyChart, go to [email or web address].    Your next appointment:   3 month(s)  The format for your next appointment:   In Person  Provider:   Buford Dresser, MD  Your physician recommends that you schedule a follow-up appointment with [facility].

## 2019-07-28 ENCOUNTER — Ambulatory Visit: Payer: Managed Care, Other (non HMO)

## 2019-07-28 MED ORDER — HYDROXYZINE HCL 25 MG PO TABS: 25.0000 mg | ORAL_TABLET | Freq: Three times a day (TID) | ORAL | 1 refills | Status: DC | PRN

## 2019-07-28 MED ORDER — HYDROXYZINE HCL 25 MG PO TABS
25.0000 mg | ORAL_TABLET | Freq: Three times a day (TID) | ORAL | 1 refills | Status: DC | PRN
Start: 2019-07-28 — End: 2019-07-28

## 2019-07-28 NOTE — Telephone Encounter (Signed)
Okay hydroxyzine-prescription emailed, however, it cannot be a long-term plan if the itching continues.  What is Corey [name] supposed to stop Plavix?  Caution-it can make him sleepy.  Thanks

## 2019-07-29 NOTE — Telephone Encounter (Signed)
Pt informed of below.  

## 2019-08-02 ENCOUNTER — Ambulatory Visit: Payer: Managed Care, Other (non HMO)

## 2019-08-05 ENCOUNTER — Other Ambulatory Visit: Payer: Self-pay | Admitting: Thoracic Surgery (Cardiothoracic Vascular Surgery)

## 2019-08-05 DIAGNOSIS — Z951 Presence of aortocoronary bypass graft: Secondary | ICD-10-CM

## 2019-08-08 ENCOUNTER — Ambulatory Visit (INDEPENDENT_AMBULATORY_CARE_PROVIDER_SITE_OTHER): Payer: Self-pay | Admitting: Physician Assistant

## 2019-08-08 ENCOUNTER — Ambulatory Visit
Admission: RE | Admit: 2019-08-08 | Discharge: 2019-08-08 | Disposition: A | Discharge status: Home / Self Care | Payer: Managed Care, Other (non HMO) | Source: Ambulatory Visit | Attending: Thoracic Surgery (Cardiothoracic Vascular Surgery) | Admitting: Thoracic Surgery (Cardiothoracic Vascular Surgery)

## 2019-08-08 ENCOUNTER — Other Ambulatory Visit: Payer: Self-pay

## 2019-08-08 VITALS — BP 126/66 | HR 83 | Temp 97.7°F | Resp 16 | Ht 73.0 in | Wt 208.0 lb

## 2019-08-08 DIAGNOSIS — Z951 Presence of aortocoronary bypass graft: Secondary | ICD-10-CM

## 2019-08-08 NOTE — Progress Notes (Addendum)
Corey [name]       [place],Corey [name] [id number]             [phone]      Corey [name] is a 69 y.o. male patient who underwent a coronary bypass grafting x2 Dr. Roxy Manns.  His postop course was unremarkable.  He has an appointment with his cardiologist on 07/27/2018.  He was changed from metoprolol tartrate to metoprolol succinate.  He was referred to the lipid clinic for cholesterol control.   1. S/P CABG x 2    Past Medical History:  Diagnosis Date  . Allergic rhinitis   . Allergy   . Arthritis   . Asthma   . Barrett's esophagus   . Cataract    removed both eyes with lens replacement Dr Katy Fitch  . Complication of anesthesia    "took them 6 hours to wake me up"  . Diabetes mellitus without complication ([place])   . GERD (gastroesophageal reflux disease)   . Heart murmur   . History of kidney stones   . HTN (hypertension)   . Hyperlipidemia   . Pneumonia    as a kid   No past surgical history pertinent negatives on file.   Scheduled Meds: Current Outpatient Medications on File Prior to Visit  Medication Sig Dispense Refill  . amLODipine-olmesartan (AZOR) 5-40 MG tablet TAKE ONE-HALF (1/2) TABLET BY MOUTH DAILY (Patient taking differently: Take 0.5 tablets by mouth daily. TAKE ONE-HALF (1/2) TABLET BY MOUTH DAILY) 90 tablet 3  . Ascorbic Acid (VITAMIN C) 1000 MG tablet Take 1,000 mg by mouth 3 (three) times daily.    Marland Kitchen aspirin EC 81 MG EC tablet Take 1 tablet (81 mg total) by mouth daily.    . cetirizine (ZYRTEC) 10 MG tablet Take 10 mg by mouth 2 (two) times daily. Takes twice daily    . cholecalciferol (VITAMIN D3) 25 MCG (1000 UT) tablet Take 5,000 Units by mouth daily.    . clopidogrel (PLAVIX) 75 MG tablet Take 1 tablet (75 mg total) by mouth daily. 30 tablet 2  . ezetimibe (ZETIA) 10 MG tablet Take 1 tablet (10 mg total) by mouth daily. 90 tablet 3  . hydrOXYzine (ATARAX/VISTARIL) 25 MG tablet Take 1 tablet (25 mg total) by mouth every 8 (eight) hours as  needed for itching. 60 tablet 1  . Magnesium 400 MG CAPS Take by mouth 3 (three) times daily.    . metoprolol succinate (TOPROL XL) 25 MG 24 hr tablet Take 1 tablet (25 mg total) by mouth daily. 90 tablet 3  . pantoprazole (PROTONIX) 40 MG tablet Take 1 tablet (40 mg total) by mouth daily. (Patient taking differently: Take 20 mg by mouth daily. ) 90 tablet 3  . saw palmetto 160 MG capsule Take 160 mg by mouth 2 (two) times daily.    . tadalafil (CIALIS) 5 MG tablet TAKE 1 TABLET BY MOUTH IN  THE MORNING -DO NOT TAKE IFYOU ARE CURRENTLY TAKING   NITRATES. (Patient taking differently: Take 5 mg by mouth daily. TAKE 1 TABLET BY MOUTH IN  THE MORNING -DO NOT TAKE IFYOU ARE CURRENTLY TAKING   NITRATES.) 90 tablet 3  . Zinc 22.5 MG TABS Take by mouth 2 (two) times a week.    Marland Kitchen acetaminophen (TYLENOL) 650 MG CR tablet Take 650 mg by mouth every 8 (eight) hours as needed for pain.     No current facility-administered medications on file prior to visit.  Allergies  Allergen Reactions  . Albuterol     REACTION: "feels like drowning"  . Aspirin Other (See Comments)    Causes bruising  . Ciprofloxacin     REACTION: rash  . Clarithromycin     REACTION: swelling  . Colesevelam     REACTION: constip  . Enalapril Maleate     REACTION: Erectile dysfunction  . Esomeprazole Magnesium     REACTION: epigastric pain  . Niacin     REACTION: diarrhea  . Pravastatin Sodium     REACTION: aching, listless  . Rosuvastatin     REACTION: achy  . Penicillins Hives    Has patient had a PCN reaction causing immediate rash, facial/tongue/throat swelling, SOB or lightheadedness with hypotension: Yes Has patient had a PCN reaction causing severe rash involving mucus membranes or skin necrosis: Yes Has patient had a PCN reaction that required hospitalization No Has patient had a PCN reaction occurring within the last 10 years: No If all of the above answers are "NO", then may proceed with Cephalosporin use.     Active Problems:   * No active hospital problems. *  Blood pressure 126/66, pulse 83, temperature 97.7 F (36.5 C), resp. rate 16, height 6\' 1"  (1.854 m), weight 208 lb (94.3 kg), SpO2 98 %.  Subjective  Corey [name] is a 69 year old male patient present today for his routine follow-up visit status post coronary artery bypass grafting x2 performed by Dr.Owen.  Objective   Cor: NSR, no murmur Pulm: CTA bilaterally and in all fields Abd: no tenderness Ext: no edema Wound: healing well without drainage or erythema.   CLINICAL DATA:  Follow-up of CABG on 07/07/2019.  Pleural e ffusions.  EXAM: CHEST - 2 VIEW  COMPARISON:  07/10/2019  FINDINGS: The heart size and mediastinal contours are within normal limits. CABG. Both lungs are clear. The visualized skeletal structures are unremarkable.  Complete resolution of the bilateral pleural effusions. No pneumothorax.  IMPRESSION: No active cardiopulmonary disease. Resolution of small bilateral pleural effusions.   Electronically Signed   By: Lorriane Shire M.D.   On: 08/08/2019 12:38  Assessment & Plan   Corey [name] presents to the office today status post coronary artery bypass grafting x2 with Dr. Roxy Manns for his routine follow-up appointment.  He has done remarkably well over the last few weeks.  Today, Corey [name] endorses walking several times daily without any shortness of breath and has had limited sternotomy pain.  Is no longer taking narcotic pain medications therefore, he is cleared to drive.  He did bring up cardiac rehab and the patient felt he was doing enough at home and that it could be a waste time for him.  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  He did recently have a cardiology appointment with Dr. Harrell Gave in which she changed his metoprolol medication to metoprolol succinate 25 mg daily.  His blood pressure  and heart rate are in a good range therefore I did not make any additional medication adjustments.  He also states that he has been controlling his blood glucose level with diet alone.  He went to visit his primary care doctor who instructed him to fill the Metformin prescription but do not take it.  He states that he is able to control his diabetes with diet alone and is very strict about his diet.  He asked about when  he will be able to discontinue his Plavix/asa81 and I will defer to cardiology for that question.  He was started on Plavix and aspirin when he was in the hospital for acute NSTEMI.  He will be scheduled for a 15-month follow-up appointment with Dr. Roxy Manns.   No medication changes were made during this visit.     Elgie Collard 08/08/2019

## 2019-08-08 NOTE — Patient Instructions (Signed)
Make every effort to stay physically active, get some type of exercise on a regular basis, and stick to a "heart healthy diet".  The long term benefits for regular exercise and a healthy diet are critically important to your overall health and wellbeing.  You may return to driving an automobile as long as you are no longer requiring oral narcotic pain relievers during the daytime.  It would be wise to start driving only short distances during the daylight and gradually increase from there as you feel comfortable.  You are encouraged to enroll and participate in the outpatient cardiac rehab program beginning as soon as practical.  Please follow-up in 3 months.

## 2019-08-10 ENCOUNTER — Ambulatory Visit: Payer: Managed Care, Other (non HMO) | Admitting: Internal Medicine

## 2019-08-11 ENCOUNTER — Ambulatory Visit: Payer: Managed Care, Other (non HMO) | Admitting: Internal Medicine

## 2019-08-30 ENCOUNTER — Other Ambulatory Visit: Payer: Self-pay | Admitting: Cardiology

## 2019-08-30 ENCOUNTER — Telehealth: Payer: Self-pay

## 2019-08-30 MED ORDER — CLOPIDOGREL BISULFATE 75 MG PO TABS: 75.0000 mg | ORAL_TABLET | Freq: Every day | ORAL | 3 refills | Status: DC

## 2019-08-30 NOTE — Telephone Encounter (Signed)
He should remain on clopidogrel until 06/2020. Ok to send in prescription. Thanks.

## 2019-08-30 NOTE — Telephone Encounter (Signed)
Patient called to get refill of Clopidogrel 75mg .  Patient advised to contact his Cardiologist for refills.  Patient acknowledged receipt.

## 2019-08-30 NOTE — Telephone Encounter (Signed)
Pt updated and verbalized understanding. New Rx sent to requested pharmacy.

## 2019-08-30 NOTE — Telephone Encounter (Signed)
The patient was calling to see if he still needed to be on the Plavix. If he does he would like a refill sent into [facility].

## 2019-08-30 NOTE — Telephone Encounter (Signed)
Pt c/o medication issue:  1. Name of Medication: clopidogrel (PLAVIX) 75 MG tablet  2. How are you currently taking this medication (dosage and times per day)? As directed  3. Are you having a reaction (difficulty breathing--STAT)? no  4. What is your medication issue? Medication was filled by the hospital. He wants to know if Dr. Harrell Gave wants him to continue to take. If so, [id number] [place], [place].

## 2019-09-06 ENCOUNTER — Encounter: Payer: Self-pay | Admitting: Cardiology

## 2019-09-06 DIAGNOSIS — Z789 Other specified health status: Secondary | ICD-10-CM | POA: Insufficient documentation

## 2019-09-09 LAB — LIPID PANEL
Chol/HDL Ratio: 4.5 ratio (ref 0.0–5.0)
Cholesterol, Total: 206 mg/dL — ABNORMAL HIGH (ref 100–199)
HDL: 46 mg/dL (ref >39)
LDL Chol Calc (NIH): 142 mg/dL — ABNORMAL HIGH (ref 0–99)
Triglycerides: 98 mg/dL (ref 0–149)
VLDL Cholesterol Cal: 18 mg/dL (ref 5–40)

## 2019-09-13 ENCOUNTER — Ambulatory Visit (INDEPENDENT_AMBULATORY_CARE_PROVIDER_SITE_OTHER): Payer: Managed Care, Other (non HMO) | Admitting: Pharmacist Clinician (PhC)/ Clinical Pharmacy Specialist

## 2019-09-13 ENCOUNTER — Other Ambulatory Visit: Payer: Self-pay

## 2019-09-13 DIAGNOSIS — E7800 Pure hypercholesterolemia, unspecified: Secondary | ICD-10-CM

## 2019-09-13 NOTE — Patient Instructions (Addendum)
Your Results:             Your most recent labs Goal  Total Cholesterol 206 < 200  Triglycerides 98 < 150  HDL (happy/good cholesterol) 46 > 40  LDL (lousy/bad cholesterol 142 < 70      Medication changes:  Try Nexletol 180 mg once daily for a week.  If you tolerate this well and decide to continue, we can give you a combination tablet of Nexletol with ezetimibe.  This combination is called Nexlizet  Lab orders:  Once you have been on the medication for 2-3 months we will repeat your cholesterol labs.    Questions or concerns - Gilles Trimpe/Raquel at [phone]   Thank you for choosing [facility]

## 2019-09-14 ENCOUNTER — Ambulatory Visit: Payer: Managed Care, Other (non HMO) | Admitting: Internal Medicine

## 2019-09-14 ENCOUNTER — Encounter: Payer: Self-pay | Admitting: Pharmacist Clinician (PhC)/ Clinical Pharmacy Specialist

## 2019-09-14 NOTE — Progress Notes (Signed)
S:   HPI: Corey [name] is a 81 YOWM who presents to the clinic today for lipid management. Pt was recently admitted to the ED for an NSTEMI on 07/05/19 and had a CABG x2 performed on 07/07/19. Pt is indicated for high intensity statin therapy, however, pt has reported intolerance to statin therapy before on 2 previous statins (rosuvastatin and pravastatin). Pt is currently on ezetimibe 10 mg daily for lipid management. Pt reports having made significant lifestyle changes, which are helping with his overall health.   Current medications include: acetaminophen 650 mg Q8H PRN pain, amlodipine-olmesartan 5-40 mg,  tab daily, vitamin C 1,000 mg TID, aspirin 81 mg daily, cetirizine 10 mg BID, vitamin D 5,000 IU daily, clopidogrel 75 mg daily, ezetimibe 10 mg daily, hydroxyzine 25 mg tabs, 1 tab Q8H PRN itching, magnesium 400 mg TID, metoprolol succinate 25 mg daily, pantoprazole 20 mg daily, saw palmetto 160 mg BID, tadalafil 5 mg daily, zinc 22.5 mg twice weekly  Antihyperlipidemic medications tried in the past include: rosuvastatin, pravastatin   PMH: HTN, NSTEMI, T2DM, dyslipidemia  FH: Mother: heart disease, diabetes, colon polyps Father: heart disease   SH: Never smoker, no alcohol use   Dietary habits include: pt reports making significant changes in his diet and has been able to come off of his diabetes medications, pt also reports starting to eat steel-cut oats to help with his cholesterol   Exercise habits include: pt has been walking up and down the stairs throughout the day in his home, pt also reports walking 2-3 miles outside about 4 days per week   ASCVD risk factors include: HTN, HLD, T2DM   O: Ht: 6'1" (73 in), Wt: 208 lbs (94.3 kg), BMI: 27.4 kg/m2   Lipid panel on 09/09/19: TC: 206 (high, 100-199 mg/dL), HDL: 46, LDL: 142 (high, 0-99 mg/dL), triglycerides: 98  CMP on 07/11/19: Na 138, K 3.6, BUN 20, SCr 1.10, Glucose 130 (high, 70-99 mg/dL) Renal function: eGFR: > 60, SCr: 1.10, CrCl: 88  mL/min   Clinical ASCVD: yes, NSTEMI Risk score: N/A  A/P: 1. Uncontrolled hyperlipidemia - Pt last LDL value was 142 mg/dL on 09/09/19 - Pt goal LDL is < 70 mg/dL since pt had NSTEMI and CABG x2 - Pt reports having tried rosuvastatin and pravastatin, but cannot tolerate them d/t myalgia that made it hard for him to get out of bed  - Pt is currently on ezetimibe and tolerating it well - Pt was counseled about the next-in-line options of Repatha or Nexletol - Pt was hesitant to start Repatha d/t a history of hives with injections, but was open to trying an oral tablet - Have pt start Nexletol for 7 days and continue taking ezetimibe daily - Pt was counseled on potential side effects of gout and tendon rupture  - Pt was counseled to continue with lifestyle changes in order to help with overall health - If pt can tolerate addition of Nexletol, ezetimibe and Nexletol will be d/c and Nexlizet combo product will be started - Reassess lipid panel in 8-12 weeks    Corey [name] Class of 2022   I was with patient and student throughout entire visit and agree with above plan  Corey [name] PharmD CPP [place] [facility]

## 2019-09-21 ENCOUNTER — Other Ambulatory Visit: Payer: Self-pay

## 2019-09-21 ENCOUNTER — Ambulatory Visit (INDEPENDENT_AMBULATORY_CARE_PROVIDER_SITE_OTHER): Payer: Managed Care, Other (non HMO) | Admitting: Internal Medicine

## 2019-09-21 ENCOUNTER — Encounter: Payer: Self-pay | Admitting: Internal Medicine

## 2019-09-21 VITALS — BP 138/78 | HR 97 | Ht 73.0 in | Wt 204.0 lb

## 2019-09-21 DIAGNOSIS — E1159 Type 2 diabetes mellitus with other circulatory complications: Secondary | ICD-10-CM

## 2019-09-21 DIAGNOSIS — E119 Type 2 diabetes mellitus without complications: Secondary | ICD-10-CM | POA: Insufficient documentation

## 2019-09-21 LAB — POCT GLYCOSYLATED HEMOGLOBIN (HGB A1C): Hemoglobin A1C: 5.8 % — AB (ref 4.0–5.6)

## 2019-09-21 NOTE — Progress Notes (Signed)
Name: Corey [name]  Age/ Sex: 69 y.o., male   MRN/ DOB: [id number], 1950/06/20     PCP: Cassandria Anger, MD   Reason for Endocrinology Evaluation: Type 2 Diabetes Mellitus  Initial Endocrine Consultative Visit: 07/13/2018    PATIENT IDENTIFIER: Corey [name] is a 69 y.o. male with a past medical history of HTN , Chronic Urticaria , T2DM, CAD and Hx of nephrolithiasis . The patient has followed with Endocrinology clinic since 07/13/2018 for consultative assistance with management of his diabetes.  DIABETIC HISTORY:  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 He was started on an MDI regimen. Pt is not a big fan of western medicine.    Pt has a diagnosis of chronic urticaria for years, his last attack of this was in January, 2020 and having to go on prednisone 80 mg daily with injections as well, that he attributes his newly diagnosed DM to   Prandial insulin stopped 08/2018, pt was offered Metformin but he opted to continue with basal insulin at the time.  Basal insulin stopped 12/2018  SUBJECTIVE:   During the last visit (04/13/2019): A1c 6.0%.  We stopped Levemir    Today (09/21/2019): [name] is here for a 4 month follow up on his diabetes management.  He checks his blood sugars 1 times daily. The patient has not had hypoglycemic episodes since the last clinic visit.   Since his last visit here he had  A CABGx 2. He has recovered well.    Pt intolerant to statins, has tried Nexletol but developed hematuria and hives  He denies chest pain and sob     HOME DIABETES REGIMEN:  N/A     GLUCOSE LOG: 95- 116 mg/dL        DIABETIC COMPLICATIONS: Microvascular complications:    Denies: retinopathy, neuropathy, CKD  Last eye exam: Completed 2021  Macrovascular complications:   CAD (S/P CABG)   Denies:  PVD,  CVA     HISTORY:  Past Medical History:  Past Medical History:  Diagnosis Date  . Allergic rhinitis   . Allergy   . Arthritis   . Asthma   . Barrett's esophagus   . Cataract    removed both eyes with lens replacement Dr Katy Fitch  . Complication of anesthesia    "took them 6 hours to wake me up"  . Diabetes mellitus without complication ([place])   . GERD (gastroesophageal reflux disease)   . Heart murmur   . History of kidney stones   . HTN (hypertension)   . Hyperlipidemia   . Pneumonia    as a kid   Past Surgical History:  Past Surgical History:  Procedure Laterality Date  . CATARACT EXTRACTION, BILATERAL     with lens implants   . COLONOSCOPY  01/15/2009   normal   . CORONARY ARTERY BYPASS GRAFT N/A 07/07/2019   Procedure: CORONARY ARTERY BYPASS GRAFTING (CABG) using LIMA to LAD; Free IMA to RCA.;  Surgeon: Rexene Alberts, MD;  Location: [place];  Service: Open Heart Surgery;  Laterality: N/A;  BILATERAL IMA  . ENDOVEIN HARVEST OF GREATER SAPHENOUS VEIN Right 07/07/2019   Procedure: [place] Of Greater Saphenous Vein;  Surgeon: Rexene Alberts, MD;  Location: [place];  Service: Open Heart Surgery;  Laterality: Right;  . ESOPHAGOGASTRODUODENOSCOPY  03/20/2006  . EYE SURGERY    .  KIDNEY STONE SURGERY     x 4 - most recent 04-2018 at [facility]   . LEFT HEART CATH AND CORONARY ANGIOGRAPHY N/A 07/05/2019   Procedure: LEFT HEART CATH AND CORONARY ANGIOGRAPHY;  Surgeon: [name], Peter M, MD;  Location: [place] [facility];  Service: Cardiovascular;  Laterality: N/A;  . TEE WITHOUT CARDIOVERSION N/A 07/07/2019   Procedure: TRANSESOPHAGEAL ECHOCARDIOGRAM (TEE);  Surgeon: Rexene Alberts, MD;  Location: [place];  Service: Open Heart Surgery;  Laterality: N/A;  . TONSILLECTOMY    . TOTAL HIP ARTHROPLASTY Right 04/30/2016   Procedure: RIGHT TOTAL HIP ARTHROPLASTY ANTERIOR APPROACH;  Surgeon: Gaynelle Arabian, MD;  Location: [facility];  Service: Orthopedics;  Laterality: Right;   requests 172mins  . TRANSTHORACIC ECHOCARDIOGRAM  01/05/1998  . UPPER GASTROINTESTINAL ENDOSCOPY      Social History:  reports that he has never smoked. He has never used smokeless tobacco. He reports that he does not drink alcohol and does not use drugs. Family History:  Family History  Problem Relation Age of Onset  . Colon polyps Mother   . Heart disease Mother   . Diabetes Mother   . Heart disease Father   . Hypertension Other   . Colon cancer Neg Hx   . Esophageal cancer Neg Hx   . Rectal cancer Neg Hx   . Stomach cancer Neg Hx      HOME MEDICATIONS: Allergies as of 09/21/2019      Reactions   Nexletol [bempedoic Acid] Other (See Comments)   Clots in urine per pt   Albuterol    REACTION: "feels like drowning"   Aspirin Other (See Comments)   Causes bruising   Ciprofloxacin    REACTION: rash   Clarithromycin    REACTION: swelling   Colesevelam    REACTION: constip   Enalapril Maleate    REACTION: Erectile dysfunction   Esomeprazole Magnesium    REACTION: epigastric pain   Niacin    REACTION: diarrhea   Pravastatin Sodium    REACTION: aching, listless   Rosuvastatin    REACTION: achy   Penicillins Hives   Has patient had a PCN reaction causing immediate rash, facial/tongue/throat swelling, SOB or lightheadedness with hypotension: Yes Has patient had a PCN reaction causing severe rash involving mucus membranes or skin necrosis: Yes Has patient had a PCN reaction that required hospitalization No Has patient had a PCN reaction occurring within the last 10 years: No If all of the above answers are "NO", then may proceed with Cephalosporin use.      Medication List       Accurate as of September 21, 2019  8:45 AM. If you have any questions, ask your nurse or doctor.        acetaminophen 650 MG CR tablet Commonly known as: TYLENOL Take 650 mg by mouth every 8 (eight) hours as needed for pain.   amLODipine-olmesartan 5-40 MG tablet Commonly known as: AZOR TAKE  ONE-HALF (1/2) TABLET BY MOUTH DAILY What changed:   how much to take  how to take this  when to take this   aspirin 81 MG EC tablet Take 1 tablet (81 mg total) by mouth daily.   cetirizine 10 MG tablet Commonly known as: ZYRTEC Take 10 mg by mouth 2 (two) times daily. Takes twice daily   cholecalciferol 25 MCG (1000 UNIT) tablet Commonly known as: VITAMIN D3 Take 5,000 Units by mouth daily.   clopidogrel 75 MG tablet Commonly known as: PLAVIX Take 1  tablet (75 mg total) by mouth daily.   ezetimibe 10 MG tablet Commonly known as: ZETIA Take 1 tablet (10 mg total) by mouth daily.   hydrOXYzine 25 MG tablet Commonly known as: ATARAX/VISTARIL Take 1 tablet (25 mg total) by mouth every 8 (eight) hours as needed for itching.   Magnesium 400 MG Caps Take by mouth 3 (three) times daily.   metoprolol succinate 25 MG 24 hr tablet Commonly known as: Toprol XL Take 1 tablet (25 mg total) by mouth daily.   pantoprazole 40 MG tablet Commonly known as: PROTONIX Take 1 tablet (40 mg total) by mouth daily. What changed: how much to take   saw palmetto 160 MG capsule Take 160 mg by mouth 2 (two) times daily.   tadalafil 5 MG tablet Commonly known as: Cialis TAKE 1 TABLET BY MOUTH IN  THE MORNING -DO NOT TAKE IFYOU ARE CURRENTLY TAKING   NITRATES. What changed:   how much to take  how to take this  when to take this   vitamin C 1000 MG tablet Take 1,000 mg by mouth 3 (three) times daily.   Zinc 22.5 MG Tabs Take by mouth 2 (two) times a week.       PHYSICAL EXAM: VS: BP (!) 138/78 (BP Location: Right Arm, Patient Position: Sitting, Cuff Size: Normal)   Pulse 97   Ht 6\' 1"  (1.854 m)   Wt (!) 204 lb (92.5 kg)   SpO2 99%   BMI 26.91 kg/m    EXAM: General: Pt appears well and is in NAD  Lungs: Clear with good BS bilat with no rales, rhonchi, or wheezes  Heart: Auscultation: RRR   Extremities:  BL LE: no pretibial edema   Mental Status: Judgment, insight:  intact Orientation: oriented to time, place, and person Mood and affect: no depression, anxiety, or agitation   DM Foot Exam 09/21/2019 The skin of the feet is intact without sores or ulcerations. Right subungual hemtoma The pedal pulses are 2+ on right and 2+ on left. The sensation is intact to a screening 5.07, 10 gram monofilament bilaterally   DATA REVIEWED:   Lab Results  Component Value Date   MICROALBUR <0.7 01/07/2019   LDLCALC 142 (H) 09/09/2019   CREATININE 1.10 07/11/2019     Results for [name], Corey D "MIKE" (MRN [id number]) as of 09/21/2019 09:05  Ref. Range 09/09/2019 08:25  Total CHOL/HDL Ratio Latest Ref Range: 0.0 - 5.0 ratio 4.5  Cholesterol, Total Latest Ref Range: 100 - 199 mg/dL 206 (H)  HDL Cholesterol Latest Ref Range: >39 mg/dL 46  Triglycerides Latest Ref Range: 0 - 149 mg/dL 98  VLDL Cholesterol Cal Latest Ref Range: 5 - 40 mg/dL 18  LDL Chol Calc (NIH) Latest Ref Range: 0 - 99 mg/dL 142 (H)  Results for [name], Corey D "MIKE" (MRN [id number]) as of 09/21/2019 09:05  Ref. Range 07/11/2019 02:54  Sodium Latest Ref Range: 135 - 145 mmol/L 138  Potassium Latest Ref Range: 3.5 - 5.1 mmol/L 3.6  Chloride Latest Ref Range: 98 - 111 mmol/L 104  CO2 Latest Ref Range: 22 - 32 mmol/L 26  Glucose Latest Ref Range: 70 - 99 mg/dL 130 (H)  BUN Latest Ref Range: 8 - 23 mg/dL 20  Creatinine Latest Ref Range: 0.61 - 1.24 mg/dL 1.10  Calcium Latest Ref Range: 8.9 - 10.3 mg/dL 8.4 (L)  Anion gap Latest Ref Range: 5 - 15  8  GFR, Est Non African American Latest Ref Range: >60 mL/min >  60  GFR, Est African American Latest Ref Range: >60 mL/min >60   ASSESSMENT / PLAN / RECOMMENDATIONS:  1) Type 2 Diabetes Mellitus, Optimally Controlled , With macrovascular  complications - Most recent A1c of 5.8  %. Goal A1c < 7.0 %.    - Pt continues to do well with diet and exercise with an A1c of 5.8 % . He did not need to start any oral glycemic agent.  - Pt was given the option  to continue follow up with PCP , but he opted to follow up annually here .   MEDICATIONS: N/A   EDUCATION / INSTRUCTIONS:  BG monitoring instructions: Patient is instructed to check his blood sugars 3 times a week.   2.) Dyslipidemia /CAD:   - S/P CABG 2 in 06/2019  - Intolerant to statins and nexletol. Pt does not believe he will try repatha due to increased risk of urice acid and his hx of urine acid stones in the past.    F/U in 1 yr    Signed electronically by: Mack Guise, MD  [facility]  [address] [place], [place] [address] Phone: [phone] FAX: [phone]   CC: Cassandria Anger, MD [place] [address] Phone: [phone]  Fax: [phone]  Return to Endocrinology clinic as below: Future Appointments  Date Time Provider [place]  10/27/2019  9:00 AM Buford Dresser, MD [facility]  11/07/2019 10:30 AM Rexene Alberts, MD [facility]

## 2019-09-21 NOTE — Patient Instructions (Signed)
-   Keep up the Good work. Your A1c is 5.8 %

## 2019-10-27 ENCOUNTER — Encounter: Payer: Self-pay | Admitting: Cardiology

## 2019-10-27 ENCOUNTER — Ambulatory Visit (INDEPENDENT_AMBULATORY_CARE_PROVIDER_SITE_OTHER): Payer: Managed Care, Other (non HMO) | Admitting: Cardiology

## 2019-10-27 ENCOUNTER — Other Ambulatory Visit: Payer: Self-pay

## 2019-10-27 VITALS — BP 126/70 | HR 88 | Ht 73.0 in | Wt 210.8 lb

## 2019-10-27 DIAGNOSIS — Z789 Other specified health status: Secondary | ICD-10-CM

## 2019-10-27 DIAGNOSIS — Z7189 Other specified counseling: Secondary | ICD-10-CM

## 2019-10-27 DIAGNOSIS — I255 Ischemic cardiomyopathy: Secondary | ICD-10-CM | POA: Diagnosis not present

## 2019-10-27 DIAGNOSIS — Z951 Presence of aortocoronary bypass graft: Secondary | ICD-10-CM

## 2019-10-27 DIAGNOSIS — I10 Essential (primary) hypertension: Secondary | ICD-10-CM | POA: Diagnosis not present

## 2019-10-27 DIAGNOSIS — I2510 Atherosclerotic heart disease of native coronary artery without angina pectoris: Secondary | ICD-10-CM | POA: Diagnosis not present

## 2019-10-27 DIAGNOSIS — E119 Type 2 diabetes mellitus without complications: Secondary | ICD-10-CM

## 2019-10-27 DIAGNOSIS — E7800 Pure hypercholesterolemia, unspecified: Secondary | ICD-10-CM

## 2019-10-27 MED ORDER — VALSARTAN 80 MG PO TABS: 80.0000 mg | ORAL_TABLET | Freq: Every day | ORAL | 11 refills | Status: DC

## 2019-10-27 NOTE — Progress Notes (Signed)
Cardiology Office Note:    Date:  10/27/2019   ID:  Corey [name], DOB 1951/02/24, MRN [id number]  PCP:  Cassandria Anger, MD  Cardiologist:  Buford Dresser, MD  Referring MD: Cassandria Anger, MD   CC: follow up  History of Present Illness:    Corey [name] is a 69 y.o. male with a hx of CAD s/p CABG, hypertension, type II diabetes, dyslipidemia, recurrent kidney stones who is seen for follow up today. He was initially seen 06/16/19 as a new consult at the request of Plotnikov, Evie Lacks, MD for the evaluation and management of elevated calcium score.  Today: Doing well. Feels occasional "bee stings" on both sides of his sternum. Brief.  Took nexletol for 7 days. Day 1 had hives on arms/legs/face, persisted for two weeks after he stopped the medication. Day 3 had blood clots in his urine, stopped 4 days after stopping nexletol. Had seen his urologist just prior and urine was clear.  He also read about PCSK9i, has many concerns especially re: uric acid, hives, etc. He already has chronic urticaria, set off very easily. Would prefer to get first dose in the clinic where he can be monitored.  Has made a lot of diet and exercise changes, A1c went from 11 to 5.8 with lifestyle changes.   Blood pressure has been fluctuating. Can be 113/65 to 120s/70s. Taking 1/2 pill of amlodipine 5mg -olmesartan 40 mg. Interested in switching to a single medication. Wants to try tapering off the metoprolol in the future as well. Discussed that last EF prior to surgery was 45-50%.   Tolerating aspirin and clopidogrel. Does bleed when he cuts himself, but no major bleeding.   Past Medical History:  Diagnosis Date  . Allergic rhinitis   . Allergy   . Arthritis   . Asthma   . Barrett's esophagus   . Cataract    removed both eyes with lens replacement Dr Katy Fitch  . Complication of anesthesia    "took them 6 hours to wake me up"  . Diabetes mellitus without complication ([place])   . GERD  (gastroesophageal reflux disease)   . Heart murmur   . History of kidney stones   . HTN (hypertension)   . Hyperlipidemia   . Pneumonia    as a kid    Past Surgical History:  Procedure Laterality Date  . CATARACT EXTRACTION, BILATERAL     with lens implants   . COLONOSCOPY  01/15/2009   normal   . CORONARY ARTERY BYPASS GRAFT N/A 07/07/2019   Procedure: CORONARY ARTERY BYPASS GRAFTING (CABG) using LIMA to LAD; Free IMA to RCA.;  Surgeon: Rexene Alberts, MD;  Location: [facility];  Service: Open Heart Surgery;  Laterality: N/A;  BILATERAL IMA  . ENDOVEIN HARVEST OF GREATER SAPHENOUS VEIN Right 07/07/2019   Procedure: [place] Of Greater Saphenous Vein;  Surgeon: Rexene Alberts, MD;  Location: [facility];  Service: Open Heart Surgery;  Laterality: Right;  . ESOPHAGOGASTRODUODENOSCOPY  03/20/2006  . EYE SURGERY    . KIDNEY STONE SURGERY     x 4 - most recent 04-2018 at [facility]   . LEFT HEART CATH AND CORONARY ANGIOGRAPHY N/A 07/05/2019   Procedure: LEFT HEART CATH AND CORONARY ANGIOGRAPHY;  Surgeon: [name], Peter M, MD;  Location: [facility];  Service: Cardiovascular;  Laterality: N/A;  . TEE WITHOUT CARDIOVERSION N/A 07/07/2019   Procedure: TRANSESOPHAGEAL ECHOCARDIOGRAM (TEE);  Surgeon: Rexene Alberts, MD;  [facility]  OR;  Service: Open Heart Surgery;  Laterality: N/A;  . TONSILLECTOMY    . TOTAL HIP ARTHROPLASTY Right 04/30/2016   Procedure: RIGHT TOTAL HIP ARTHROPLASTY ANTERIOR APPROACH;  Surgeon: Gaynelle Arabian, MD;  Location: [facility];  Service: Orthopedics;  Laterality: Right;  requests 160mins  . TRANSTHORACIC ECHOCARDIOGRAM  01/05/1998  . UPPER GASTROINTESTINAL ENDOSCOPY      Current Medications: Current Outpatient Medications on File Prior to Visit  Medication Sig  . acetaminophen (TYLENOL) 650 MG CR tablet Take 650 mg by mouth every 8 (eight) hours as needed for pain.  Marland Kitchen amLODipine-olmesartan (AZOR) 5-40 MG tablet TAKE ONE-HALF (1/2) TABLET BY MOUTH DAILY   . Ascorbic Acid (VITAMIN C) 1000 MG tablet Take 1,000 mg by mouth 3 (three) times daily.  Marland Kitchen aspirin EC 81 MG EC tablet Take 1 tablet (81 mg total) by mouth daily.  . cetirizine (ZYRTEC) 10 MG tablet Take 10 mg by mouth 2 (two) times daily. Takes twice daily  . cholecalciferol (VITAMIN D3) 25 MCG (1000 UT) tablet Take 5,000 Units by mouth daily.  . clopidogrel (PLAVIX) 75 MG tablet Take 1 tablet (75 mg total) by mouth daily.  Marland Kitchen ezetimibe (ZETIA) 10 MG tablet Take 1 tablet (10 mg total) by mouth daily.  . hydrOXYzine (ATARAX/VISTARIL) 25 MG tablet Take 1 tablet (25 mg total) by mouth every 8 (eight) hours as needed for itching.  . Magnesium 400 MG CAPS Take by mouth 3 (three) times daily.  . metoprolol succinate (TOPROL XL) 25 MG 24 hr tablet Take 1 tablet (25 mg total) by mouth daily.  . pantoprazole (PROTONIX) 40 MG tablet Take 1 tablet (40 mg total) by mouth daily. (Patient taking differently: Take 20 mg by mouth daily. )  . saw palmetto 160 MG capsule Take 160 mg by mouth 2 (two) times daily.  . tadalafil (CIALIS) 5 MG tablet TAKE 1 TABLET BY MOUTH IN  THE MORNING -DO NOT TAKE IFYOU ARE CURRENTLY TAKING   NITRATES.  Marland Kitchen Zinc 22.5 MG TABS Take by mouth 2 (two) times a week.   No current facility-administered medications on file prior to visit.     Allergies:   Nexletol [bempedoic acid], Albuterol, Aspirin, Ciprofloxacin, Clarithromycin, Colesevelam, Enalapril maleate, Esomeprazole magnesium, Niacin, Pravastatin sodium, Rosuvastatin, and Penicillins   Social History   Tobacco Use  . Smoking status: Never Smoker  . Smokeless tobacco: Never Used  Vaping Use  . Vaping Use: Never used  Substance Use Topics  . Alcohol use: No  . Drug use: No    Family History: family history includes Colon polyps in his mother; Diabetes in his mother; Heart disease in his father and mother; Hypertension in an other family member. There is no history of Colon cancer, Esophageal cancer, Rectal cancer, or  Stomach cancer.  ROS:   Please see the history of present illness.  Additional pertinent ROS otherwise unremarkable.  EKGs/Labs/Other Studies Reviewed:    The following studies were reviewed today: Echo 07/06/19 1. Left ventricular ejection fraction, by estimation, is 45 to 50%. The  left ventricle has mildly decreased function. The left ventricle has no  regional wall motion abnormalities. Left ventricular diastolic parameters  are consistent with Grade I  diastolic dysfunction (impaired relaxation).  2. Right ventricular systolic function is normal. The right ventricular  size is normal.  3. The mitral valve is normal in structure. No evidence of mitral valve  regurgitation. No evidence of mitral stenosis.  4. The aortic valve is normal in structure. Aortic valve  regurgitation is  not visualized. No aortic stenosis is present.  5. The inferior vena cava is normal in size with greater than 50%  respiratory variability, suggesting right atrial pressure of 3 mmHg.   Cath 07/05/19  Mid LM to Dist LM lesion is 30% stenosed.  Ost LAD to Prox LAD lesion is 90% stenosed.  Prox RCA lesion is 100% stenosed.  The left ventricular systolic function is normal.  LV end diastolic pressure is normal.  The left ventricular ejection fraction is 50-55% by visual estimate.  There is no mitral valve regurgitation.   1. Severe 2 vessel CAD     - 90% ostial LAD stenosis. Heavily calcified    - 100% proximal RCA with left to right collateral. Vessel is also heavily calcified. 2. Inferior hypokinesis with overall well preserved LV function 3. Normal LVEDP  Plan: patient has complex CAD with total occlusion of a large RCA with collaterals and ostial LAD which supplies a large LAD and large diagonal. It would be difficult to manage this with PCI since stenting of the LAD would require jailing the LCx and could compromise this vessel. I would recommend consideration for CABG. CT surgery  consulted.    Calcium score 06/08/19 Coronary calcium score of 1275. This was 71 percentile for age and sex matched control.  EKG:  EKG is personally reviewed.  The ekg ordered 07/27/19 demonstrates NSR at 76 bpm  Recent Labs: 07/06/2019: ALT 51; TSH 1.672 07/08/2019: Magnesium 2.0 07/10/2019: Hemoglobin 9.9; Platelets 152 07/11/2019: BUN 20; Creatinine, Ser 1.10; Potassium 3.6; Sodium 138  Recent Lipid Panel    Component Value Date/Time   CHOL 206 (H) 09/09/2019 0825   TRIG 98 09/09/2019 0825   HDL 46 09/09/2019 0825   CHOLHDL 4.5 09/09/2019 0825   CHOLHDL 4 05/16/2019 0855   VLDL 18.6 05/16/2019 0855   LDLCALC 142 (H) 09/09/2019 0825   LDLDIRECT 191.5 04/15/2013 0732   LDLDIRECT 193.6 04/15/2013 0732    Physical Exam:    VS:  BP 126/70   Pulse 88   Ht 6\' 1"  (1.854 m)   Wt 210 lb 12.8 oz (95.6 kg)   SpO2 98%   BMI 27.81 kg/m     Wt Readings from Last 3 Encounters:  10/27/19 210 lb 12.8 oz (95.6 kg)  09/21/19 (!) 204 lb (92.5 kg)  08/08/19 208 lb (94.3 kg)    GEN: Well nourished, well developed in no acute distress HEENT: Normal, moist mucous membranes NECK: No JVD CARDIAC: regular rhythm, normal S1 and S2, no rubs or gallops. No murmur. VASCULAR: Radial and DP pulses 2+ bilaterally. No carotid bruits RESPIRATORY:  Clear to auscultation without rales, wheezing or rhonchi  ABDOMEN: Soft, non-tender, non-distended MUSCULOSKELETAL:  Ambulates independently SKIN: Warm and dry, no edema NEUROLOGIC:  Alert and oriented x 3. No focal neuro deficits noted. PSYCHIATRIC:  Normal affect   ASSESSMENT:    1. Coronary artery disease involving native coronary artery of native heart without angina pectoris   2. Essential hypertension   3. S/P CABG x 2   4. Ischemic cardiomyopathy   5. Hypercholesteremia   6. Statin intolerance   7. Type 2 diabetes mellitus without obesity ([place])   8. Cardiac risk counseling   9. Counseling on health promotion and disease prevention    PLAN:     CAD, now s/p CABG: initially referred for elevated calcium score. When he had chest pain, referred to ER, found to have NSTEMI. Cath as above, referred for CABG for  complex CAD. -CABG 07/07/19 by Dr. Roxy Manns (LIMA to LAD, Free RIMA to RCA with Short Reversed Greater Saphenous Vein Interposition at Proximal Anastomosis) -doing well post CABG, mild occasional sharp sternal pain -on aspirin 81 mg -continue clopidogrel until 06/2020 given NSTEMI on presentation -tolerating metoprolol succinate 25 mg daily.  Cardiomyopathy, likely ischemic: pre-CABG EF 45-50% -continue metoprolol succinate -on olmesartan -repeat echo prior to discontinuing metoprolol  hypercholesterolemia, with LDL goal <70 given CAD -with recent CABG, complex CAD, and prior statin intolerance, will refer to PharmD clinic to discuss PCSK9i. Also did not tolerate nexletol -ordered fasting lipid panel, LFTs -with history of hives, would prefer to get first injection in clinic to be monitored  Hypertension: goal <130/80. Has been running lower since surgery -given gradual reduction in blood pressure, will change to valsartan alone from amlodipine-olmesartan -continue metoprolol as above given reduced EF  Type II diabetes, without obesity: -in the setting of CAD, discussed recommendation for SGLT2i and GLP1RA. He will consider if he requires daily diabetes medications in the future. My preference would be SGLT2i given likely ischemic cardiomyopathy -wishes to stay on metformin PRN at this time.  Cardiac risk counseling and prevention recommendations: -recommend heart healthy/Mediterranean diet, with whole grains, fruits, vegetable, fish, lean meats, nuts, and olive oil. Limit salt. -recommend moderate walking, 3-5 times/week for 30-50 minutes each session. Aim for at least 150 minutes.week. Goal should be pace of 3 miles/hours, or walking 1.5 miles in 30 minutes -recommend avoidance of tobacco products. Avoid excess  alcohol.  Plan for follow up: 6 mos with me, will also refer to lipid clinic  Buford Dresser, MD, PhD   [facility]    Medication Adjustments/Labs and Tests Ordered: Current medicines are reviewed at length with the patient today.  Concerns regarding medicines are outlined above.  No orders of the defined types were placed in this encounter.  Meds ordered this encounter  Medications  . DISCONTD: valsartan (DIOVAN) 80 MG tablet    Sig: Take 1 tablet (80 mg total) by mouth daily.    Dispense:  30 tablet    Refill:  11    Patient Instructions  Medication Instructions:  Stop taking Amlodipine-Olmesartan 5-40 mg  Start Valsartan 80 mg daily  *If you need a refill on your cardiac medications before your next appointment, please call your pharmacy*   Lab Work: None ordered    Testing/Procedures: None ordered   Follow-Up: At [facility], you and your health needs are our priority.  As part of our continuing mission to provide you with exceptional heart care, we have created designated Provider Care Teams.  These Care Teams include your primary Cardiologist (physician) and Advanced Practice Providers (APPs -  Physician Assistants and Nurse Practitioners) who all work together to provide you with the care you need, when you need it.  We recommend signing up for the patient portal called "MyChart".  Sign up information is provided on this After Visit Summary.  MyChart is used to connect with patients for Virtual Visits (Telemedicine).  Patients are able to view lab/test results, encounter notes, upcoming appointments, etc.  Non-urgent messages can be sent to your provider as well.   To learn more about what you can do with MyChart, go to [email or web address].    Your next appointment:   6 month(s)  The format for your next appointment:   In Person  Provider:   Buford Dresser, MD  Your physician recommends that you schedule a follow-up  appointment in 1 month  with Lipid Clinic ( Pharm D).     Signed, Buford Dresser, MD PhD 10/27/2019  [place]

## 2019-10-27 NOTE — Patient Instructions (Signed)
Medication Instructions:  Stop taking Amlodipine-Olmesartan 5-40 mg  Start Valsartan 80 mg daily  *If you need a refill on your cardiac medications before your next appointment, please call your pharmacy*   Lab Work: None ordered    Testing/Procedures: None ordered   Follow-Up: At [facility], you and your health needs are our priority.  As part of our continuing mission to provide you with exceptional heart care, we have created designated Provider Care Teams.  These Care Teams include your primary Cardiologist (physician) and Advanced Practice Providers (APPs -  Physician Assistants and Nurse Practitioners) who all work together to provide you with the care you need, when you need it.  We recommend signing up for the patient portal called "MyChart".  Sign up information is provided on this After Visit Summary.  MyChart is used to connect with patients for Virtual Visits (Telemedicine).  Patients are able to view lab/test results, encounter notes, upcoming appointments, etc.  Non-urgent messages can be sent to your provider as well.   To learn more about what you can do with MyChart, go to [email or web address].    Your next appointment:   6 month(s)  The format for your next appointment:   In Person  Provider:   Buford Dresser, MD  Your physician recommends that you schedule a follow-up appointment in 1 month with Lipid Clinic ( Pharm D).

## 2019-11-07 ENCOUNTER — Encounter: Payer: Self-pay | Admitting: Thoracic Surgery (Cardiothoracic Vascular Surgery)

## 2019-11-07 ENCOUNTER — Ambulatory Visit (INDEPENDENT_AMBULATORY_CARE_PROVIDER_SITE_OTHER): Payer: Managed Care, Other (non HMO) | Admitting: Thoracic Surgery (Cardiothoracic Vascular Surgery)

## 2019-11-07 ENCOUNTER — Other Ambulatory Visit: Payer: Self-pay

## 2019-11-07 VITALS — BP 128/77 | HR 79 | Temp 97.7°F | Resp 20 | Ht 73.0 in | Wt 210.0 lb

## 2019-11-07 DIAGNOSIS — Z951 Presence of aortocoronary bypass graft: Secondary | ICD-10-CM

## 2019-11-07 NOTE — Patient Instructions (Signed)
Continue all previous medications without any changes at this time.  Discussed with your cardiologist whether or not it would be appropriate to stop taking Plavix within the next 2 to 3 months.  You may resume unrestricted physical activity without any particular limitations at this time.

## 2019-11-07 NOTE — Progress Notes (Signed)
[address]       [place] [address]             [phone]     CARDIOTHORACIC SURGERY OFFICE NOTE  Referring Provider is Stanford Breed, Denice Bors, MD Primary Cardiologist is Buford Dresser, MD PCP is Plotnikov, Evie Lacks, MD   HPI:  Patient is a 69 year old male with no previous history of coronary artery disease but risk factors notable for history of type 2 diabetes mellitus, hyperlipidemia, and a family history of coronary artery disease who returns to the office today for routine follow-up status post coronary artery bypass grafting x2 on Jul 07, 2019 for severe two-vessel coronary artery disease status post acute non-ST segment elevation myocardial infarction.  His postoperative recovery was uneventful and he was last seen here in our office on August 08, 2019 at which time he was doing well.  He was seen recently in follow-up by Dr. Harrell Gave and returns to our office today for routine follow-up.  Patient reports that he feels very well.  He states that he still has occasional tingling along both sides of his sternum typical for internal mammary artery harvest, but he no longer has any pain in his chest.  He states that his breathing is better and in fact he feels better than he has for several years.  He states that in the past he would get short of breath going up a flight of stairs and he states that now he can run up a flight of stairs without any difficulty.  He has not had any chest pain or chest tightness with exertion.  Overall he is delighted with his progress.   Current Outpatient Medications  Medication Sig Dispense Refill  . acetaminophen (TYLENOL) 650 MG CR tablet Take 650 mg by mouth every 8 (eight) hours as needed for pain.    Marland Kitchen amLODipine-olmesartan (AZOR) 5-40 MG tablet Take 1 tablet by mouth daily. Takes 1/2 tablet daily    . Ascorbic Acid (VITAMIN C) 1000 MG tablet Take 1,000 mg by mouth 3 (three) times daily.    Marland Kitchen aspirin EC 81 MG EC tablet  Take 1 tablet (81 mg total) by mouth daily.    . cetirizine (ZYRTEC) 10 MG tablet Take 10 mg by mouth 2 (two) times daily. Takes twice daily    . cholecalciferol (VITAMIN D3) 25 MCG (1000 UT) tablet Take 5,000 Units by mouth daily.    . clopidogrel (PLAVIX) 75 MG tablet Take 1 tablet (75 mg total) by mouth daily. 90 tablet 3  . ezetimibe (ZETIA) 10 MG tablet Take 1 tablet (10 mg total) by mouth daily. 90 tablet 3  . hydrOXYzine (ATARAX/VISTARIL) 25 MG tablet Take 1 tablet (25 mg total) by mouth every 8 (eight) hours as needed for itching. 60 tablet 1  . Magnesium 400 MG CAPS Take by mouth 3 (three) times daily.    . metoprolol succinate (TOPROL XL) 25 MG 24 hr tablet Take 1 tablet (25 mg total) by mouth daily. 90 tablet 3  . pantoprazole (PROTONIX) 40 MG tablet Take 1 tablet (40 mg total) by mouth daily. (Patient taking differently: Take 20 mg by mouth daily. ) 90 tablet 3  . saw palmetto 160 MG capsule Take 160 mg by mouth 2 (two) times daily.    . tadalafil (CIALIS) 5 MG tablet TAKE 1 TABLET BY MOUTH IN  THE MORNING -DO NOT TAKE IFYOU ARE CURRENTLY TAKING   NITRATES. 90 tablet 3  .  Zinc 22.5 MG TABS Take by mouth 2 (two) times a week.     No current facility-administered medications for this visit.      Physical Exam:   BP 128/77   Pulse 79   Temp 97.7 F (36.5 C) (Skin)   Resp 20   Ht 6\' 1"  (1.854 m)   Wt 210 lb (95.3 kg)   SpO2 98% Comment: RA  BMI 27.71 kg/m   General:  Well-appearing  Chest:   Clear to auscultation  CV:   Regular rate and rhythm without murmur  Incisions:  Completely healed, sternum is stable  Abdomen:  Soft nontender  Extremities:  Warm and well-perfused  Diagnostic Tests:  n/a   Impression:  Patient is doing very well more than 4 months status post coronary artery bypass grafting  Plan:  We have not recommended any changes to the patient's current medications.  We will defer timing for stopping dual antiplatelet therapy to Dr. Harrell Gave,  whether it be 6 months or 1 year since his acute MI and surgery.  I have encouraged the patient to continue to increase his physical activity without any limitations.  All of his questions have been addressed.  Patient will return to our office next May, approximately 1 year following his surgery.  He will call and return sooner should specific problems or questions arise.  I spent in excess of 15 minutes during the conduct of this office consultation and >50% of this time involved direct face-to-face encounter with the patient for counseling and/or coordination of their care.    Valentina Gu. Roxy Manns, MD 11/07/2019 10:28 AM

## 2019-11-28 ENCOUNTER — Ambulatory Visit: Payer: Managed Care, Other (non HMO)

## 2019-12-11 ENCOUNTER — Encounter: Payer: Self-pay | Admitting: Cardiology

## 2020-01-12 ENCOUNTER — Telehealth: Payer: Self-pay | Admitting: Cardiology

## 2020-01-12 NOTE — Telephone Encounter (Signed)
Pt c/o medication issue:  1. Name of Medication: metoprolol succinate (TOPROL XL) 25 MG 24 hr tablet  2. How are you currently taking this medication (dosage and times per day)? 1 tablet by mouth daily   3. Are you having a reaction (difficulty breathing--STAT)? No  4. What is your medication issue? Ronalee Belts is calling stating he is ready to begin the process of winging him off of metoprolol that he previously discussed with Dr. Harrell Gave. Please advise.

## 2020-01-12 NOTE — Telephone Encounter (Signed)
Pt called stating that he was 6 months out from CABG and that Dr. Arlan Organ agreed that he could come off of BB at that time. I did review his cardiac history - BB is part of GDMT. He wants to D/C his BB.  He is on a low dose of lopressor. I advised him to reduce to one half tablet for two weeks and then he could stop after that. Will message Dr. Harrell Gave to make sure she is aware that he is tapering off.

## 2020-01-13 ENCOUNTER — Telehealth: Payer: Self-pay | Admitting: Cardiology

## 2020-01-13 NOTE — Telephone Encounter (Signed)
Pt c/o BP issue: STAT if pt c/o blurred vision, one-sided weakness or slurred speech  1. What are your last 5 BP readings? 145/85 142/83 142/85 138/82 120/73  2. Are you having any other symptoms (ex. Dizziness, headache, blurred vision, passed out)? No  3. What is your BP issue? Patient states BP has been elevated. He assumes Valsartan is not helping. Please call to discuss.

## 2020-01-13 NOTE — Telephone Encounter (Signed)
Returned to pt, informed pt that these numbers are not concerning and that Dr Harrell Gave is in the office seeing pt's. He also wanted to let her know that he has decreased his metoprolol to 1/2 tab today (12.5mg ). please advise.

## 2020-01-16 NOTE — Telephone Encounter (Signed)
Pt updated with MD's recommendations and verbalized understanding.  

## 2020-01-16 NOTE — Telephone Encounter (Signed)
These numbers are acceptable. I would continue to give it time and continue on the current medications. If blood pressures are consistently >150/90, he should call back and let us know.

## 2020-01-23 ENCOUNTER — Ambulatory Visit: Payer: Managed Care, Other (non HMO)

## 2020-02-09 ENCOUNTER — Telehealth: Payer: Self-pay | Admitting: Cardiology

## 2020-02-09 NOTE — Telephone Encounter (Signed)
Called pt. Pt expresses concern for continuing to wean off metoprolol succinate. Pt states that he has been taking half a tablet (12.5mg ) for about 2 weeks now.  Instructed pt that he can continue to take half a tablet (12.5mg ) every other day for about a week and then go to taking 12.5mg  tablet every 3rd day for an additional week and then pt can stop taking the medication. Pt verbalizes understanding.

## 2020-02-09 NOTE — Telephone Encounter (Signed)
Pt c/o medication issue:  1. Name of Medication:   metoprolol succinate (TOPROL-XL) 25 MG 24 hr tablet    2. How are you currently taking this medication (dosage and times per day)? Half a tablets  3. Are you having a reaction (difficulty breathing--STAT)? no  4. What is your medication issue? Patient states he is tappering off the medications. He states he has been taking half a tablet for 2 weeks and would like to know what the next step is to get off the medication.

## 2020-04-30 ENCOUNTER — Encounter: Payer: Self-pay | Admitting: Cardiology

## 2020-04-30 ENCOUNTER — Other Ambulatory Visit: Payer: Self-pay

## 2020-04-30 ENCOUNTER — Ambulatory Visit: Payer: Managed Care, Other (non HMO) | Admitting: Cardiology

## 2020-04-30 VITALS — BP 138/84 | HR 97 | Ht 73.5 in | Wt 217.0 lb

## 2020-04-30 DIAGNOSIS — Z951 Presence of aortocoronary bypass graft: Secondary | ICD-10-CM | POA: Diagnosis not present

## 2020-04-30 DIAGNOSIS — E7800 Pure hypercholesterolemia, unspecified: Secondary | ICD-10-CM

## 2020-04-30 DIAGNOSIS — I10 Essential (primary) hypertension: Secondary | ICD-10-CM

## 2020-04-30 DIAGNOSIS — E119 Type 2 diabetes mellitus without complications: Secondary | ICD-10-CM

## 2020-04-30 DIAGNOSIS — Z789 Other specified health status: Secondary | ICD-10-CM

## 2020-04-30 DIAGNOSIS — I2510 Atherosclerotic heart disease of native coronary artery without angina pectoris: Secondary | ICD-10-CM

## 2020-04-30 DIAGNOSIS — I255 Ischemic cardiomyopathy: Secondary | ICD-10-CM

## 2020-04-30 DIAGNOSIS — Z7189 Other specified counseling: Secondary | ICD-10-CM

## 2020-04-30 NOTE — Progress Notes (Signed)
Cardiology Office Note:    Date:  04/30/2020   ID:  Corey [name], DOB [birth year], MRN [id number]  PCP:  Cassandria Anger, MD  Cardiologist:  Buford Dresser, MD  Referring MD: Cassandria Anger, MD   CC: follow up  History of Present Illness:    Corey [name] is a 70 y.o. male with a hx of CAD s/p CABG, hypertension, type II diabetes, dyslipidemia, recurrent kidney stones who is seen for follow up today. He was initially seen 06/16/19 as a new consult at the request of Plotnikov, Evie Lacks, MD for the evaluation and management of elevated calcium score.  Today: Had an episode last night where his muscles were jumping. Woke him up from sleep, pulsed about 10 time then stopped. No associated symptoms. Has never happen before. Pulse was normal at the time.   No longer having lightning strike sensation in his chest.  Tolerating medications well. Blood pressure cuff at home has not been working well. Feels much better on the valsartan.  Tolerating ezetimibe, has upcoming lipids with Dr. Alain Marion. We did discuss inclisiran, as he is hesitant about injections 1-2 times/month and also has a history of hives, so was nervous about PCSK9i antibodies. He is in favor of RNA technology and would be interested in knowing more. He tolerated Covid injections well.  Has been able to be active, detailing his car. Climbs stairs in his house 30-40 times/day without issues. Walks 2 miles around the neighborhood without issues.   Fasting blood sugar well controlled, on no meds, controlling with diet and exercise.  Denies chest pain, shortness of breath at rest or with normal exertion. No PND, orthopnea, LE edema or unexpected weight gain. No syncope or palpitations.  Past Medical History:  Diagnosis Date   Allergic rhinitis    Allergy    Arthritis    Asthma    Barrett's esophagus    Cataract    removed both eyes with lens replacement Dr Katy Fitch   Complication of anesthesia    "took  them 6 hours to wake me up"   Diabetes mellitus without complication (HCC)    GERD (gastroesophageal reflux disease)    Heart murmur    History of kidney stones    HTN (hypertension)    Hyperlipidemia    Pneumonia    as a kid    Past Surgical History:  Procedure Laterality Date   CATARACT EXTRACTION, BILATERAL     with lens implants    COLONOSCOPY  01/15/2009   normal    CORONARY ARTERY BYPASS GRAFT N/A 07/07/2019   Procedure: CORONARY ARTERY BYPASS GRAFTING (CABG) using LIMA to LAD; Free IMA to RCA.;  Surgeon: Rexene Alberts, MD;  Location: [facility];  Service: Open Heart Surgery;  Laterality: N/A;  BILATERAL IMA   ENDOVEIN HARVEST OF GREATER SAPHENOUS VEIN Right 07/07/2019   Procedure: [place] Of Greater Saphenous Vein;  Surgeon: Rexene Alberts, MD;  Location: [facility];  Service: Open Heart Surgery;  Laterality: Right;   ESOPHAGOGASTRODUODENOSCOPY  03/20/2006   EYE SURGERY     KIDNEY STONE SURGERY     x 4 - most recent 04-2018 at [facility]    LEFT HEART CATH AND CORONARY ANGIOGRAPHY N/A 07/05/2019   Procedure: LEFT HEART CATH AND CORONARY ANGIOGRAPHY;  Surgeon: [name], Peter M, MD;  Location: [facility];  Service: Cardiovascular;  Laterality: N/A;   TEE WITHOUT CARDIOVERSION N/A 07/07/2019   Procedure: TRANSESOPHAGEAL ECHOCARDIOGRAM (TEE);  Surgeon: Roxy Manns,  Valentina Gu, MD;  Location: [facility];  Service: Open Heart Surgery;  Laterality: N/A;   TONSILLECTOMY     TOTAL HIP ARTHROPLASTY Right 04/30/2016   Procedure: RIGHT TOTAL HIP ARTHROPLASTY ANTERIOR APPROACH;  Surgeon: Gaynelle Arabian, MD;  Location: [facility];  Service: Orthopedics;  Laterality: Right;  requests 134mins   TRANSTHORACIC ECHOCARDIOGRAM  01/05/1998   UPPER GASTROINTESTINAL ENDOSCOPY      Current Medications: Current Outpatient Medications on File Prior to Visit  Medication Sig   Ascorbic Acid (VITAMIN C) 1000 MG tablet Take 1,000 mg by mouth 3 (three) times daily.   aspirin EC 81 MG EC tablet Take 1  tablet (81 mg total) by mouth daily.   cetirizine (ZYRTEC) 10 MG tablet Take 10 mg by mouth 2 (two) times daily. Takes twice daily   cholecalciferol (VITAMIN D3) 25 MCG (1000 UT) tablet Take 5,000 Units by mouth daily.   clopidogrel (PLAVIX) 75 MG tablet Take 1 tablet (75 mg total) by mouth daily.   ezetimibe (ZETIA) 10 MG tablet Take 1 tablet (10 mg total) by mouth daily.   hydrOXYzine (ATARAX/VISTARIL) 25 MG tablet Take 1 tablet (25 mg total) by mouth every 8 (eight) hours as needed for itching.   Magnesium 400 MG CAPS Take by mouth 3 (three) times daily.   pantoprazole (PROTONIX) 40 MG tablet Take 1 tablet (40 mg total) by mouth daily. (Patient taking differently: Take 20 mg by mouth daily.)   tadalafil (CIALIS) 5 MG tablet TAKE 1 TABLET BY MOUTH IN  THE MORNING -DO NOT TAKE IFYOU ARE CURRENTLY TAKING   NITRATES.   valsartan (DIOVAN) 80 MG tablet Take 80 mg by mouth daily.   Zinc 22.5 MG TABS Take by mouth 2 (two) times daily.   No current facility-administered medications on file prior to visit.     Allergies:   Nexletol [bempedoic acid], Albuterol, Aspirin, Ciprofloxacin, Clarithromycin, Colesevelam, Enalapril maleate, Esomeprazole magnesium, Niacin, Pravastatin sodium, Rosuvastatin, and Penicillins   Social History   Tobacco Use   Smoking status: Never Smoker   Smokeless tobacco: Never Used  Vaping Use   Vaping Use: Never used  Substance Use Topics   Alcohol use: No   Drug use: No    Family History: family history includes Colon polyps in his mother; Diabetes in his mother; Heart disease in his father and mother; Hypertension in an other family member. There is no history of Colon cancer, Esophageal cancer, Rectal cancer, or Stomach cancer.  ROS:   Please see the history of present illness.  Additional pertinent ROS otherwise unremarkable.  EKGs/Labs/Other Studies Reviewed:    The following studies were reviewed today: Echo 07/06/19 1. Left ventricular ejection fraction,  by estimation, is 45 to 50%. The  left ventricle has mildly decreased function. The left ventricle has no  regional wall motion abnormalities. Left ventricular diastolic parameters  are consistent with Grade I  diastolic dysfunction (impaired relaxation).   2. Right ventricular systolic function is normal. The right ventricular  size is normal.   3. The mitral valve is normal in structure. No evidence of mitral valve  regurgitation. No evidence of mitral stenosis.   4. The aortic valve is normal in structure. Aortic valve regurgitation is  not visualized. No aortic stenosis is present.   5. The inferior vena cava is normal in size with greater than 50%  respiratory variability, suggesting right atrial pressure of 3 mmHg.   Cath 07/05/19 Mid LM to Dist LM lesion is 30% stenosed. Ost LAD to  Prox LAD lesion is 90% stenosed. Prox RCA lesion is 100% stenosed. The left ventricular systolic function is normal. LV end diastolic pressure is normal. The left ventricular ejection fraction is 50-55% by visual estimate. There is no mitral valve regurgitation.   1. Severe 2 vessel CAD     - 90% ostial LAD stenosis. Heavily calcified    - 100% proximal RCA with left to right collateral. Vessel is also heavily calcified. 2. Inferior hypokinesis with overall well preserved LV function 3. Normal LVEDP   Plan: patient has complex CAD with total occlusion of a large RCA with collaterals and ostial LAD which supplies a large LAD and large diagonal. It would be difficult to manage this with PCI since stenting of the LAD would require jailing the LCx and could compromise this vessel. I would recommend consideration for CABG. CT surgery consulted.    Calcium score 06/08/19 Coronary calcium score of 1275. This was 42 percentile for age and sex matched control.  EKG:  EKG is personally reviewed.  The ekg ordered 04/30/20 demonstrates NSR at 97 bpm  Recent Labs: 07/06/2019: ALT 51; TSH 1.672 07/08/2019:  Magnesium 2.0 07/10/2019: Hemoglobin 9.9; Platelets 152 07/11/2019: BUN 20; Creatinine, Ser 1.10; Potassium 3.6; Sodium 138  Recent Lipid Panel    Component Value Date/Time   CHOL 206 (H) 09/09/2019 0825   TRIG 98 09/09/2019 0825   HDL 46 09/09/2019 0825   CHOLHDL 4.5 09/09/2019 0825   CHOLHDL 4 05/16/2019 0855   VLDL 18.6 05/16/2019 0855   LDLCALC 142 (H) 09/09/2019 0825   LDLDIRECT 191.5 04/15/2013 0732   LDLDIRECT 193.6 04/15/2013 0732    Physical Exam:    VS:  BP 138/84 (BP Location: Left Arm, Patient Position: Sitting)   Pulse 97   Ht 6' 1.5" (1.867 m)   Wt 217 lb (98.4 kg)   SpO2 96%   BMI 28.24 kg/m     Wt Readings from Last 3 Encounters:  04/30/20 217 lb (98.4 kg)  11/07/19 210 lb (95.3 kg)  10/27/19 210 lb 12.8 oz (95.6 kg)    GEN: Well nourished, well developed in no acute distress HEENT: Normal, moist mucous membranes NECK: No JVD CARDIAC: regular rhythm, normal S1 and S2, no rubs or gallops. No murmur. VASCULAR: Radial and DP pulses 2+ bilaterally. No carotid bruits RESPIRATORY:  Clear to auscultation without rales, wheezing or rhonchi  ABDOMEN: Soft, non-tender, non-distended MUSCULOSKELETAL:  Ambulates independently SKIN: Warm and dry, no edema NEUROLOGIC:  Alert and oriented x 3. No focal neuro deficits noted. PSYCHIATRIC:  Normal affect     ASSESSMENT:    1. Coronary artery disease involving native coronary artery of native heart without angina pectoris   2. S/P CABG x 2   3. Ischemic cardiomyopathy   4. Hypercholesteremia   5. Essential hypertension   6. Statin intolerance   7. Type 2 diabetes mellitus without obesity ([place])   8. Cardiac risk counseling    PLAN:    CAD, now s/p CABG: initially referred for elevated calcium score. When he had chest pain, referred to ER, found to have NSTEMI. Cath as above, referred for CABG for complex CAD. -CABG 07/07/19 by Dr. Roxy Manns (LIMA to LAD, Free RIMA to RCA with Short Reversed Greater Saphenous Vein  Interposition at Proximal Anastomosis) -doing well post CABG, mild occasional sharp sternal pain -on aspirin 81 mg -continue clopidogrel until after 06/2020 given NSTEMI on presentation. Will plan to stop 07/2020. -now off metoprolol succinate 25 mg daily (patient preference)  Cardiomyopathy, likely ischemic: pre-CABG EF 45-50% -repeating echo -on ARB, no longer on metoprolol succinate as above  hypercholesterolemia, with LDL goal <70 given CAD -with recent CABG, complex CAD, and prior statin intolerance, discussed PharmD/PCSK9i. Did not tolerate nexletol. Tolerating ezetimibe. -ordered fasting lipid panel, LFTs -with history of hives, would prefer to get first injection in clinic to be monitored  Hypertension: goal <130/80. Slightly elevated today -had been running low, changed to valsartan alone from ARB-amlodipine  Type II diabetes, without obesity: -in the setting of CAD, discussed recommendation for SGLT2i and GLP1RA. He will consider if he requires daily diabetes medications in the future. My preference would be SGLT2i given likely ischemic cardiomyopathy  Cardiac risk counseling and prevention recommendations: -recommend heart healthy/Mediterranean diet, with whole grains, fruits, vegetable, fish, lean meats, nuts, and olive oil. Limit salt. -recommend moderate walking, 3-5 times/week for 30-50 minutes each session. Aim for at least 150 minutes.week. Goal should be pace of 3 miles/hours, or walking 1.5 miles in 30 minutes -recommend avoidance of tobacco products. Avoid excess alcohol.  Plan for follow up: 6 mos   Buford Dresser, MD, PhD [place]  [facility]    Medication Adjustments/Labs and Tests Ordered: Current medicines are reviewed at length with the patient today.  Concerns regarding medicines are outlined above.  Orders Placed This Encounter  Procedures   EKG 12-Lead   ECHOCARDIOGRAM COMPLETE   No orders of the defined types were placed in this  encounter.   Patient Instructions  Medication Instructions:  -You can stop clopidogrel (plavix) in June  *If you need a refill on your cardiac medications before your next appointment, please call your pharmacy*   Lab Work: None   Testing/Procedures: Your physician has requested that you have an echocardiogram. Echocardiography is a painless test that uses sound waves to create images of your heart. It provides your doctor with information about the size and shape of your heart and how well your heart's chambers and valves are working. This procedure takes approximately one hour. There are no restrictions for this procedure. [place] [address]    Follow-Up: At [facility], you and your health needs are our priority.  As part of our continuing mission to provide you with exceptional heart care, we have created designated Provider Care Teams.  These Care Teams include your primary Cardiologist (physician) and Advanced Practice Providers (APPs -  Physician Assistants and Nurse Practitioners) who all work together to provide you with the care you need, when you need it.  We recommend signing up for the patient portal called "MyChart".  Sign up information is provided on this After Visit Summary.  MyChart is used to connect with patients for Virtual Visits (Telemedicine).  Patients are able to view lab/test results, encounter notes, upcoming appointments, etc.  Non-urgent messages can be sent to your provider as well.   To learn more about what you can do with MyChart, go to [email or web address].    Your next appointment:   6 month(s)  The format for your next appointment:   In Person  Provider:   Buford Dresser, MD  Your physician recommends that you schedule a follow-up appointment with Dr. Debara Pickett to discuss new cholesterol therapy (inclisiran)    Signed, Buford Dresser, MD PhD 04/30/2020  [place]

## 2020-04-30 NOTE — Patient Instructions (Addendum)
Medication Instructions:  -You can stop clopidogrel (plavix) in June  *If you need a refill on your cardiac medications before your next appointment, please call your pharmacy*   Lab Work: None   Testing/Procedures: Your physician has requested that you have an echocardiogram. Echocardiography is a painless test that uses sound waves to create images of your heart. It provides your doctor with information about the size and shape of your heart and how well your heart's chambers and valves are working. This procedure takes approximately one hour. There are no restrictions for this procedure. [place] [address]    Follow-Up: At [facility], you and your health needs are our priority.  As part of our continuing mission to provide you with exceptional heart care, we have created designated Provider Care Teams.  These Care Teams include your primary Cardiologist (physician) and Advanced Practice Providers (APPs -  Physician Assistants and Nurse Practitioners) who all work together to provide you with the care you need, when you need it.  We recommend signing up for the patient portal called "MyChart".  Sign up information is provided on this After Visit Summary.  MyChart is used to connect with patients for Virtual Visits (Telemedicine).  Patients are able to view lab/test results, encounter notes, upcoming appointments, etc.  Non-urgent messages can be sent to your provider as well.   To learn more about what you can do with MyChart, go to [email or web address].    Your next appointment:   6 month(s)  The format for your next appointment:   In Person  Provider:   Buford Dresser, MD  Your physician recommends that you schedule a follow-up appointment with Dr. Debara Pickett to discuss new cholesterol therapy (inclisiran)

## 2020-05-08 ENCOUNTER — Other Ambulatory Visit: Payer: Self-pay | Admitting: Cardiology

## 2020-05-08 DIAGNOSIS — I2510 Atherosclerotic heart disease of native coronary artery without angina pectoris: Secondary | ICD-10-CM

## 2020-05-08 DIAGNOSIS — E7800 Pure hypercholesterolemia, unspecified: Secondary | ICD-10-CM

## 2020-05-22 ENCOUNTER — Other Ambulatory Visit: Payer: Self-pay

## 2020-05-22 ENCOUNTER — Ambulatory Visit (HOSPITAL_COMMUNITY): Payer: Managed Care, Other (non HMO) | Attending: Internal Medicine

## 2020-05-22 DIAGNOSIS — I2510 Atherosclerotic heart disease of native coronary artery without angina pectoris: Secondary | ICD-10-CM | POA: Diagnosis not present

## 2020-05-22 DIAGNOSIS — Z951 Presence of aortocoronary bypass graft: Secondary | ICD-10-CM | POA: Diagnosis present

## 2020-05-22 DIAGNOSIS — I255 Ischemic cardiomyopathy: Secondary | ICD-10-CM | POA: Insufficient documentation

## 2020-05-22 LAB — ECHOCARDIOGRAM COMPLETE
Area-P 1/2: 3.43 cm2
S' Lateral: 3.3 cm

## 2020-06-08 ENCOUNTER — Other Ambulatory Visit: Payer: Self-pay | Admitting: Internal Medicine

## 2020-07-02 ENCOUNTER — Other Ambulatory Visit: Payer: Self-pay

## 2020-07-02 ENCOUNTER — Telehealth (INDEPENDENT_AMBULATORY_CARE_PROVIDER_SITE_OTHER): Payer: Managed Care, Other (non HMO) | Admitting: Thoracic Surgery (Cardiothoracic Vascular Surgery)

## 2020-07-02 ENCOUNTER — Telehealth: Payer: Managed Care, Other (non HMO) | Admitting: Thoracic Surgery (Cardiothoracic Vascular Surgery)

## 2020-07-02 ENCOUNTER — Encounter: Payer: Managed Care, Other (non HMO) | Admitting: Thoracic Surgery (Cardiothoracic Vascular Surgery)

## 2020-07-02 ENCOUNTER — Encounter: Payer: Self-pay | Admitting: Thoracic Surgery (Cardiothoracic Vascular Surgery)

## 2020-07-02 DIAGNOSIS — Z951 Presence of aortocoronary bypass graft: Secondary | ICD-10-CM | POA: Diagnosis not present

## 2020-07-02 NOTE — Progress Notes (Signed)
[address]       [place] [address]             [phone]     CARDIOTHORACIC SURGERY TELEPHONE VIRTUAL OFFICE NOTE  Referring Provider is Stanford Breed, Denice Bors, MD Primary Cardiologist is Buford Dresser, MD PCP is Plotnikov, Evie Lacks, MD   HPI:  I spoke with Corey [name] (DOB 1950-04-15 ) via telephone on 07/02/2020 at 5:19 PM and verified that I was speaking with the correct person using more than one form of identification.  We discussed the fact that I was contacting them from my office and they were located at home, as well as the reason(s) for conducting our visit virtually instead of in-person.  The patient expressed understanding the circumstances and agreed to proceed as described.   Patient is a 70 year old male with no previous history of coronary artery disease but risk factors notable for history of type 2 diabetes mellitus, hyperlipidemia, and a family history of coronary artery disease who returns to the office today for routine follow-up status post coronary artery bypass grafting x2 on Jul 07, 2019 for severe two-vessel coronary artery disease status post acute non-ST segment elevation myocardial infarction.  He was last seen here in our office on November 07, 2019 at which time he was doing well.  Since then he has been followed carefully by Dr. Harrell Gave who saw him most recently April 30, 2020.  I spoke with the patient on the telephone this afternoon to see how he is getting along.  He states that he feels very well.  He states that he is impressed at how quickly he recovered from the surgery and he has had no problems whatsoever.  Overall he feels much improved in comparison with how he felt immediately prior to his surgery.  He is now back to unrestricted physical activity and he specifically denies any exertional shortness of breath or chest discomfort.  Blood pressure has been under good control.  Blood sugars have been well controlled  with diet alone.   Current Outpatient Medications  Medication Sig Dispense Refill  . Ascorbic Acid (VITAMIN C) 1000 MG tablet Take 1,000 mg by mouth 3 (three) times daily.    Mar[place] Kitchen aspirin EC 81 MG EC tablet Take 1 tablet (81 mg total) by mouth daily.    . cetirizine (ZYRTEC) 10 MG tablet Take 10 mg by mouth 2 (two) times daily. Takes twice daily    . cholecalciferol (VITAMIN D3) 25 MCG (1000 UT) tablet Take 5,000 Units by mouth daily.    . clopidogrel (PLAVIX) 75 MG tablet Take 1 tablet (75 mg total) by mouth daily. 90 tablet 3  . ezetimibe (ZETIA) 10 MG tablet TAKE 1 TABLET BY MOUTH EVERY DAY 90 tablet 3  . hydrOXYzine (ATARAX/VISTARIL) 25 MG tablet Take 1 tablet (25 mg total) by mouth every 8 (eight) hours as needed for itching. 60 tablet 1  . Magnesium 400 MG CAPS Take by mouth 3 (three) times daily.    . pantoprazole (PROTONIX) 40 MG tablet Take 1 tablet (40 mg total) by mouth daily. (Patient taking differently: Take 20 mg by mouth daily.) 90 tablet 3  . tadalafil (CIALIS) 5 MG tablet TAKE 1 TABLET BY MOUTH IN THE MORNING -DO NOT TAKE IFYOU ARE CURRENTLY TAKING NITRATES. 30 tablet 0  . valsartan (DIOVAN) 80 MG tablet Take 80 mg by mouth daily.    . Zinc 22.5 MG TABS Take by mouth 2 (two)  times daily.     No current facility-administered medications for this visit.     Diagnostic Tests:  n/a   Impression:  Patient is doing very well approximately 1 year following coronary artery bypass grafting  Plan:  Patient will call and return to our office in the future only should specific problems or questions arise.  He will continue to follow-up regularly with Dr. Harrell Gave.    I discussed limitations of evaluation and management via telephone.  The patient was advised to call back for repeat telephone consultation or to seek an in-person evaluation if questions arise or the patient's clinical condition changes in any significant manner.  I spent in excess of 5 minutes of  non-face-to-face time during the conduct of this telephone virtual office consultation, including pre-visit review of the patient's records and direct conversation with the patient.     Valentina Gu. Roxy Manns, MD 07/02/2020 5:19 PM

## 2020-07-02 NOTE — Patient Instructions (Signed)
Continue all previous medications without any changes at this time  

## 2020-07-08 ENCOUNTER — Other Ambulatory Visit: Payer: Self-pay | Admitting: Internal Medicine

## 2020-09-12 ENCOUNTER — Other Ambulatory Visit: Payer: Self-pay | Admitting: Internal Medicine

## 2020-09-25 ENCOUNTER — Ambulatory Visit: Payer: Managed Care, Other (non HMO) | Admitting: Internal Medicine

## 2020-10-11 ENCOUNTER — Other Ambulatory Visit: Payer: Self-pay | Admitting: Internal Medicine

## 2020-10-24 ENCOUNTER — Other Ambulatory Visit: Payer: Self-pay | Admitting: Cardiology

## 2020-10-24 DIAGNOSIS — I10 Essential (primary) hypertension: Secondary | ICD-10-CM

## 2020-10-24 NOTE — Telephone Encounter (Signed)
Rx(s) sent to pharmacy electronically.  

## 2020-11-06 ENCOUNTER — Ambulatory Visit: Payer: Managed Care, Other (non HMO) | Admitting: Internal Medicine

## 2020-11-06 ENCOUNTER — Encounter: Payer: Self-pay | Admitting: Internal Medicine

## 2020-11-06 ENCOUNTER — Other Ambulatory Visit: Payer: Self-pay

## 2020-11-06 VITALS — BP 146/82 | HR 102 | Ht 73.5 in | Wt 214.0 lb

## 2020-11-06 DIAGNOSIS — E1159 Type 2 diabetes mellitus with other circulatory complications: Secondary | ICD-10-CM

## 2020-11-06 DIAGNOSIS — E785 Hyperlipidemia, unspecified: Secondary | ICD-10-CM

## 2020-11-06 LAB — LIPID PANEL
Cholesterol: 177 mg/dL (ref 0–200)
HDL: 48.9 mg/dL (ref >39.00)
NonHDL: 127.68
Total CHOL/HDL Ratio: 4
Triglycerides: 206 mg/dL — ABNORMAL HIGH (ref 0.0–149.0)
VLDL: 41.2 mg/dL — ABNORMAL HIGH (ref 0.0–40.0)

## 2020-11-06 LAB — BASIC METABOLIC PANEL
BUN: 20 mg/dL (ref 6–23)
CO2: 28 mEq/L (ref 19–32)
Calcium: 9.3 mg/dL (ref 8.4–10.5)
Chloride: 100 mEq/L (ref 96–112)
Creatinine, Ser: 1.41 mg/dL (ref 0.40–1.50)
GFR: 51.45 mL/min — ABNORMAL LOW (ref >60.00)
Glucose, Bld: 137 mg/dL — ABNORMAL HIGH (ref 70–99)
Potassium: 4.3 mEq/L (ref 3.5–5.1)
Sodium: 136 mEq/L (ref 135–145)

## 2020-11-06 LAB — HEMOGLOBIN A1C: Hgb A1c MFr Bld: 6.6 % — ABNORMAL HIGH (ref 4.6–6.5)

## 2020-11-06 LAB — MICROALBUMIN / CREATININE URINE RATIO
Creatinine,U: 110 mg/dL
Microalb Creat Ratio: 0.6 mg/g (ref 0.0–30.0)
Microalb, Ur: <0.7 mg/dL (ref 0.0–1.9)

## 2020-11-06 LAB — LDL CHOLESTEROL, DIRECT: Direct LDL: 119 mg/dL

## 2020-11-06 NOTE — Progress Notes (Signed)
Name: Corey [name]  Age/ Sex: 70 y.o., male   MRN/ DOB: [id number], 1951/01/24     PCP: Cassandria Anger, MD   Reason for Endocrinology Evaluation: Type 2 Diabetes Mellitus  Initial Endocrine Consultative Visit: 07/13/2018    PATIENT IDENTIFIER: Corey [name] is a 70 y.o. male with a past medical history of HTN , Chronic Urticaria , T2DM, CAD and Hx of nephrolithiasis . The patient has followed with Endocrinology clinic since 07/13/2018 for consultative assistance with management of his diabetes.  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 He was started on an MDI regimen. Pt is not a big fan of western medicine.    Pt has a diagnosis of chronic urticaria for years, his last attack of this was in January, 2020 and having to go on prednisone 80 mg daily with injections as well, that he attributes his newly diagnosed DM to   Prandial insulin stopped 08/2018, pt was offered Metformin but he opted to continue with basal insulin at the time.  Basal insulin stopped 12/2018  SUBJECTIVE:   During the last visit (09/21/2019): A1c 5.8 %. Pt remained off medications     Today (11/06/2020): Corey [name] is here for a 4 month follow up on his diabetes management.  He checks his blood sugars 1 times daily. The patient has not had hypoglycemic episodes since the last clinic visit.    Pt intolerant to statins, has tried Nexletol but developed hematuria and hives     Weight has been fluctuating  Denies nausea or diarrhea       HOME DIABETES REGIMEN:  N/A     GLUCOSE LOG: 104 - 132 mg/dL        DIABETIC COMPLICATIONS: Microvascular complications:    Denies: retinopathy, neuropathy, CKD Last eye exam: Completed 2021- has an appointment 11/2020   Macrovascular complications:   CAD (S/P CABG)  Denies:  PVD, CVA       HISTORY:  Past Medical History:  Past Medical History:  Diagnosis Date   Allergic rhinitis    Allergy    Arthritis    Asthma    Barrett's esophagus    Cataract    removed both eyes with lens replacement Dr Katy Fitch   Complication of anesthesia    "took them 6 hours to wake me up"   Diabetes mellitus without complication (HCC)    GERD (gastroesophageal reflux disease)    Heart murmur    History of kidney stones    HTN (hypertension)    Hyperlipidemia    Pneumonia    as a kid   Past Surgical History:  Past Surgical History:  Procedure Laterality Date   CATARACT EXTRACTION, BILATERAL     with lens implants    COLONOSCOPY  01/15/2009   normal    CORONARY ARTERY BYPASS GRAFT N/A 07/07/2019   Procedure: CORONARY ARTERY BYPASS GRAFTING (CABG) using LIMA to LAD; Free IMA to RCA.;  Surgeon: Rexene Alberts, MD;  Location: [place];  Service: Open Heart Surgery;  Laterality: N/A;  BILATERAL IMA   ENDOVEIN HARVEST OF GREATER SAPHENOUS VEIN Right 07/07/2019   Procedure: [place] Of Greater Saphenous Vein;  Surgeon: Rexene Alberts, MD;  Location: [place];  Service: Open Heart Surgery;  Laterality: Right;   ESOPHAGOGASTRODUODENOSCOPY  03/20/2006   EYE SURGERY     KIDNEY  STONE SURGERY     x 4 - most recent 04-2018 at [facility]    LEFT HEART CATH AND CORONARY ANGIOGRAPHY N/A 07/05/2019   Procedure: LEFT HEART CATH AND CORONARY ANGIOGRAPHY;  Surgeon: [name], Peter M, MD;  Location: [facility];  Service: Cardiovascular;  Laterality: N/A;   TEE WITHOUT CARDIOVERSION N/A 07/07/2019   Procedure: TRANSESOPHAGEAL ECHOCARDIOGRAM (TEE);  Surgeon: Rexene Alberts, MD;  Location: [place];  Service: Open Heart Surgery;  Laterality: N/A;   TONSILLECTOMY     TOTAL HIP ARTHROPLASTY Right 04/30/2016   Procedure: RIGHT TOTAL HIP ARTHROPLASTY ANTERIOR APPROACH;  Surgeon: Gaynelle Arabian, MD;  Location: [facility];  Service: Orthopedics;  Laterality: Right;  requests 162mns   TRANSTHORACIC  ECHOCARDIOGRAM  01/05/1998   UPPER GASTROINTESTINAL ENDOSCOPY     Social History:  reports that he has never smoked. He has never used smokeless tobacco. He reports that he does not drink alcohol and does not use drugs. Family History:  Family History  Problem Relation Age of Onset   Colon polyps Mother    Heart disease Mother    Diabetes Mother    Heart disease Father    Hypertension Other    Colon cancer Neg Hx    Esophageal cancer Neg Hx    Rectal cancer Neg Hx    Stomach cancer Neg Hx      HOME MEDICATIONS: Allergies as of 11/06/2020       Reactions   Nexletol [bempedoic Acid] Other (See Comments)   Clots in urine per pt   Albuterol    REACTION: "feels like drowning"   Aspirin Other (See Comments)   Causes bruising   Ciprofloxacin    REACTION: rash   Clarithromycin    REACTION: swelling   Colesevelam    REACTION: constip   Enalapril Maleate    REACTION: Erectile dysfunction   Esomeprazole Magnesium    REACTION: epigastric pain   Niacin    REACTION: diarrhea   Pravastatin Sodium    REACTION: aching, listless   Rosuvastatin    REACTION: achy   Penicillins Hives   Has patient had a PCN reaction causing immediate rash, facial/tongue/throat swelling, SOB or lightheadedness with hypotension: Yes Has patient had a PCN reaction causing severe rash involving mucus membranes or skin necrosis: Yes Has patient had a PCN reaction that required hospitalization No Has patient had a PCN reaction occurring within the last 10 years: No If all of the above answers are "NO", then may proceed with Cephalosporin use.        Medication List        Accurate as of November 06, 2020  7:10 AM. If you have any questions, ask your nurse or doctor.          aspirin 81 MG EC tablet Take 1 tablet (81 mg total) by mouth daily.   cetirizine 10 MG tablet Commonly known as: ZYRTEC Take 10 mg by mouth 2 (two) times daily. Takes twice daily   cholecalciferol 25 MCG (1000 UNIT)  tablet Commonly known as: VITAMIN D3 Take 5,000 Units by mouth daily.   clopidogrel 75 MG tablet Commonly known as: PLAVIX Take 1 tablet (75 mg total) by mouth daily.   ezetimibe 10 MG tablet Commonly known as: ZETIA TAKE 1 TABLET BY MOUTH EVERY DAY   hydrOXYzine 25 MG tablet Commonly known as: ATARAX/VISTARIL Take 1 tablet (25 mg total) by mouth every 8 (eight) hours as needed for itching.   Magnesium 400 MG  Caps Take by mouth 3 (three) times daily.   pantoprazole 40 MG tablet Commonly known as: PROTONIX Take 1 tablet (40 mg total) by mouth daily. What changed: how much to take   tadalafil 5 MG tablet Commonly known as: CIALIS TAKE 1 TABLET BY MOUTH IN THE MORNING -DO NOT TAKE IFYOU ARE CURRENTLY TAKING NITRATES.   valsartan 80 MG tablet Commonly known as: DIOVAN TAKE 1 TABLET BY MOUTH EVERY DAY   vitamin C 1000 MG tablet Take 1,000 mg by mouth 3 (three) times daily.   Zinc 22.5 MG Tabs Take by mouth 2 (two) times daily.        PHYSICAL EXAM: VS: BP (!) 146/82 (BP Location: Left Arm, Patient Position: Sitting, Cuff Size: Small)   Pulse (!) 102   Ht 6' 1.5" (1.867 m)   Wt 214 lb (97.1 kg)   SpO2 95%   BMI 27.85 kg/m   EXAM: General: Pt appears well and is in NAD  Lungs: Clear with good BS bilat with no rales, rhonchi, or wheezes  Heart: Auscultation: RRR   Extremities:  BL LE: no pretibial edema   Mental Status: Judgment, insight: intact Orientation: oriented to time, place, and person Mood and affect: no depression, anxiety, or agitation   DM Foot Exam 11/06/2020 The skin of the feet is intact without sores or ulcerations. Right subungual hemtoma The pedal pulses are 2+ on right and 2+ on left. The sensation is intact to a screening 5.07, 10 gram monofilament bilaterally   DATA REVIEWED:   Lab Results  Component Value Date   MICROALBUR <0.7 01/07/2019   LDLCALC 142 (H) 09/09/2019   CREATININE 1.10 07/11/2019  Results for [name], Corey D  "MIKE" (MRN [id number]) as of 11/07/2020 08:15  Ref. Range 11/06/2020 08:22  Sodium Latest Ref Range: 135 - 145 mEq/L 136  Potassium Latest Ref Range: 3.5 - 5.1 mEq/L 4.3  Chloride Latest Ref Range: 96 - 112 mEq/L 100  CO2 Latest Ref Range: 19 - 32 mEq/L 28  Glucose Latest Ref Range: 70 - 99 mg/dL 137 (H)  BUN Latest Ref Range: 6 - 23 mg/dL 20  Creatinine Latest Ref Range: 0.40 - 1.50 mg/dL 1.41  Calcium Latest Ref Range: 8.4 - 10.5 mg/dL 9.3  GFR Latest Ref Range: >60.00 mL/min 51.45 (L)  Total CHOL/HDL Ratio Unknown 4  Cholesterol Latest Ref Range: 0 - 200 mg/dL 177  HDL Cholesterol Latest Ref Range: >39.00 mg/dL 48.90  Direct LDL Latest Units: mg/dL 119.0  MICROALB/CREAT RATIO Latest Ref Range: 0.0 - 30.0 mg/g 0.6  NonHDL Unknown 127.68  Triglycerides Latest Ref Range: 0.0 - 149.0 mg/dL 206.0 (H)  VLDL Latest Ref Range: 0.0 - 40.0 mg/dL 41.2 (H)  Hemoglobin A1C Latest Ref Range: 4.6 - 6.5 % 6.6 (H)  Creatinine,U Latest Units: mg/dL 110.0  Microalb, Ur Latest Ref Range: 0.0 - 1.9 mg/dL <0.7      ASSESSMENT / PLAN / RECOMMENDATIONS:  1) Type 2 Diabetes Mellitus, Optimally Controlled , With macrovascular  complications - Most recent A1c of 6.6 %. Goal A1c < 7.0 %.     - Pt continues to do well with diet and exercise  - No changes at this time , but will bring him sooner for a follow up visit    MEDICATIONS: N/A   EDUCATION / INSTRUCTIONS: BG monitoring instructions: Patient is instructed to check his blood sugars 3 times a week.   2.) Dyslipidemia /CAD:   - S/P CABG 2 in 06/2019  -  Intolerant to statins and nexletol. He did not want to try repatha due to concerns of elevated uric acid. Tolerating Zetia  - LDL improving but above goal    F/U in 6 months    Signed electronically by: Mack Guise, MD  [facility]  [facility] [address], [place] [address] Phone: [phone] FAX:  [phone]   CC: Cassandria Anger, MD [place] [address] Phone: [phone]  Fax: [phone]  Return to Endocrinology clinic as below: Future Appointments  Date Time Provider [place]  11/06/2020  8:10 AM Jere Bostrom, Melanie Crazier, MD [facility] [place]

## 2020-11-10 ENCOUNTER — Other Ambulatory Visit: Payer: Self-pay | Admitting: Internal Medicine

## 2020-11-15 ENCOUNTER — Telehealth: Payer: Self-pay | Admitting: Internal Medicine

## 2020-11-15 MED ORDER — METFORMIN HCL ER 500 MG PO TB24: 500.0000 mg | ORAL_TABLET | Freq: Two times a day (BID) | ORAL | 2 refills | Status: DC

## 2020-11-15 NOTE — Telephone Encounter (Signed)
Pt called, would like to be put back on Metformin 500mg  to help control his sugars. [facility] [place]. Any questions please call pt [phone]

## 2020-12-18 ENCOUNTER — Other Ambulatory Visit: Payer: Self-pay | Admitting: *Deleted

## 2020-12-21 ENCOUNTER — Other Ambulatory Visit (HOSPITAL_BASED_OUTPATIENT_CLINIC_OR_DEPARTMENT_OTHER): Payer: Self-pay | Admitting: *Deleted

## 2020-12-21 DIAGNOSIS — E7800 Pure hypercholesterolemia, unspecified: Secondary | ICD-10-CM

## 2020-12-21 DIAGNOSIS — I2510 Atherosclerotic heart disease of native coronary artery without angina pectoris: Secondary | ICD-10-CM

## 2020-12-21 DIAGNOSIS — I10 Essential (primary) hypertension: Secondary | ICD-10-CM

## 2020-12-21 MED ORDER — VALSARTAN 80 MG PO TABS: 80.0000 mg | ORAL_TABLET | Freq: Every day | ORAL | 1 refills | Status: DC

## 2020-12-21 MED ORDER — EZETIMIBE 10 MG PO TABS: 10.0000 mg | ORAL_TABLET | Freq: Every day | ORAL | 1 refills | Status: DC

## 2020-12-21 NOTE — Telephone Encounter (Signed)
Rx(s) sent to pharmacy electronically.  

## 2021-01-03 ENCOUNTER — Ambulatory Visit: Payer: Managed Care, Other (non HMO) | Admitting: Internal Medicine

## 2021-01-03 ENCOUNTER — Encounter: Payer: Self-pay | Admitting: Internal Medicine

## 2021-01-03 ENCOUNTER — Other Ambulatory Visit: Payer: Self-pay

## 2021-01-03 DIAGNOSIS — I10 Essential (primary) hypertension: Secondary | ICD-10-CM

## 2021-01-03 DIAGNOSIS — Z951 Presence of aortocoronary bypass graft: Secondary | ICD-10-CM

## 2021-01-03 DIAGNOSIS — E119 Type 2 diabetes mellitus without complications: Secondary | ICD-10-CM | POA: Diagnosis not present

## 2021-01-03 DIAGNOSIS — G720 Drug-induced myopathy: Secondary | ICD-10-CM

## 2021-01-03 DIAGNOSIS — T466X5A Adverse effect of antihyperlipidemic and antiarteriosclerotic drugs, initial encounter: Secondary | ICD-10-CM

## 2021-01-03 DIAGNOSIS — E7800 Pure hypercholesterolemia, unspecified: Secondary | ICD-10-CM

## 2021-01-03 DIAGNOSIS — I2510 Atherosclerotic heart disease of native coronary artery without angina pectoris: Secondary | ICD-10-CM

## 2021-01-03 MED ORDER — PANTOPRAZOLE SODIUM 40 MG PO TBEC: 40.0000 mg | DELAYED_RELEASE_TABLET | Freq: Every day | ORAL | 3 refills | Status: DC

## 2021-01-03 MED ORDER — VALSARTAN 80 MG PO TABS: 80.0000 mg | ORAL_TABLET | Freq: Every day | ORAL | 3 refills | Status: DC

## 2021-01-03 MED ORDER — EZETIMIBE 10 MG PO TABS: 10.0000 mg | ORAL_TABLET | Freq: Every day | ORAL | 3 refills | Status: DC

## 2021-01-03 MED ORDER — TADALAFIL 5 MG PO TABS: ORAL_TABLET | ORAL | 3 refills | Status: DC

## 2021-01-03 MED ORDER — HYDROXYZINE HCL 25 MG PO TABS: 25.0000 mg | ORAL_TABLET | Freq: Three times a day (TID) | ORAL | 0 refills | Status: DC | PRN

## 2021-01-03 NOTE — Assessment & Plan Note (Addendum)
On diet and Zetia

## 2021-01-03 NOTE — Progress Notes (Signed)
Subjective:  Patient ID: Corey [name], male    DOB: 11-Sep-1950  Age: 70 y.o. MRN: [id number]  CC: Medication Refill (hydroxyzine)   HPI Corey [name] presents for DM, CAD, dyslipidemia Mike's son's fianc of 7 years  died suddenly in her home one week ago.  Its been very hard.  Outpatient Medications Prior to Visit  Medication Sig Dispense Refill   Ascorbic Acid (VITAMIN C) 1000 MG tablet Take 1,000 mg by mouth 3 (three) times daily.     aspirin EC 81 MG EC tablet Take 1 tablet (81 mg total) by mouth daily.     cetirizine (ZYRTEC) 10 MG tablet Take 10 mg by mouth 2 (two) times daily. Takes twice daily     cholecalciferol (VITAMIN D3) 25 MCG (1000 UT) tablet Take 5,000 Units by mouth daily.     Magnesium 400 MG CAPS Take by mouth 3 (three) times daily.     metFORMIN (GLUCOPHAGE-XR) 500 MG 24 hr tablet Take 1 tablet (500 mg total) by mouth in the morning and at bedtime. 180 tablet 2   Zinc 22.5 MG TABS Take by mouth 2 (two) times daily.     ezetimibe (ZETIA) 10 MG tablet Take 1 tablet (10 mg total) by mouth daily. Please schedule appointment for future refills. Thank you 90 tablet 1   hydrOXYzine (ATARAX/VISTARIL) 25 MG tablet Take 1 tablet (25 mg total) by mouth every 8 (eight) hours as needed for itching. 60 tablet 1   pantoprazole (PROTONIX) 40 MG tablet Take 1 tablet (40 mg total) by mouth daily. (Patient taking differently: Take 20 mg by mouth daily.) 90 tablet 3   tadalafil (CIALIS) 5 MG tablet TAKE 1 TABLET BY MOUTH IN THE MORNING -DO NOT TAKE IFYOU ARE CURRENTLY TAKING NITRATES. 30 tablet 11   valsartan (DIOVAN) 80 MG tablet Take 1 tablet (80 mg total) by mouth daily. 90 tablet 1   No facility-administered medications prior to visit.    ROS: Review of Systems  Constitutional:  Negative for appetite change, fatigue and unexpected weight change.  HENT:  Negative for congestion, nosebleeds, sneezing, sore throat and trouble swallowing.   Eyes:  Negative for itching and  visual disturbance.  Respiratory:  Negative for cough.   Cardiovascular:  Negative for chest pain, palpitations and leg swelling.  Gastrointestinal:  Negative for abdominal distention, blood in stool, diarrhea and nausea.  Genitourinary:  Negative for frequency and hematuria.  Musculoskeletal:  Negative for back pain, gait problem, joint swelling and neck pain.  Skin:  Negative for rash.  Neurological:  Negative for dizziness, tremors, speech difficulty and weakness.  Psychiatric/Behavioral:  Negative for agitation, dysphoric mood and sleep disturbance. The patient is not nervous/anxious.    Objective:  BP 132/70 (BP Location: Left Arm)   Pulse 94   Temp 98 F (36.7 C) (Oral)   Ht 6' 1.5" (1.867 m)   Wt 215 lb (97.5 kg)   SpO2 98%   BMI 27.98 kg/m   BP Readings from Last 3 Encounters:  01/03/21 132/70  11/06/20 (!) 146/82  04/30/20 138/84    Wt Readings from Last 3 Encounters:  01/03/21 215 lb (97.5 kg)  11/06/20 214 lb (97.1 kg)  04/30/20 217 lb (98.4 kg)    Physical Exam Constitutional:      General: He is not in acute distress.    Appearance: He is well-developed.     Comments: NAD  Eyes:     Conjunctiva/sclera: Conjunctivae normal.     Pupils:  Pupils are equal, round, and reactive to light.  Neck:     Thyroid: No thyromegaly.     Vascular: No JVD.  Cardiovascular:     Rate and Rhythm: Normal rate and regular rhythm.     Heart sounds: Normal heart sounds. No murmur heard.   No friction rub. No gallop.  Pulmonary:     Effort: Pulmonary effort is normal. No respiratory distress.     Breath sounds: Normal breath sounds. No wheezing or rales.  Chest:     Chest wall: No tenderness.  Abdominal:     General: Bowel sounds are normal. There is no distension.     Palpations: Abdomen is soft. There is no mass.     Tenderness: There is no abdominal tenderness. There is no guarding or rebound.  Musculoskeletal:        General: No tenderness. Normal range of motion.      Cervical back: Normal range of motion.  Lymphadenopathy:     Cervical: No cervical adenopathy.  Skin:    General: Skin is warm and dry.     Findings: No rash.  Neurological:     Mental Status: He is alert and oriented to person, place, and time.     Cranial Nerves: No cranial nerve deficit.     Motor: No abnormal muscle tone.     Coordination: Coordination normal.     Gait: Gait normal.     Deep Tendon Reflexes: Reflexes are normal and symmetric.  Psychiatric:        Behavior: Behavior normal.        Thought Content: Thought content normal.        Judgment: Judgment normal.  Rectal-per urology and GI  Lab Results  Component Value Date   WBC 10.1 07/10/2019   HGB 9.9 (L) 07/10/2019   HCT 30.2 (L) 07/10/2019   PLT 152 07/10/2019   GLUCOSE 137 (H) 11/06/2020   CHOL 177 11/06/2020   TRIG 206.0 (H) 11/06/2020   HDL 48.90 11/06/2020   LDLDIRECT 119.0 11/06/2020   LDLCALC 142 (H) 09/09/2019   ALT 51 (H) 07/06/2019   AST 75 (H) 07/06/2019   NA 136 11/06/2020   K 4.3 11/06/2020   CL 100 11/06/2020   CREATININE 1.41 11/06/2020   BUN 20 11/06/2020   CO2 28 11/06/2020   TSH 1.672 07/06/2019   PSA 0.97 05/16/2019   INR 1.2 07/07/2019   HGBA1C 6.6 (H) 11/06/2020   MICROALBUR <0.7 11/06/2020    DG Chest 2 View  Result Date: 08/08/2019 CLINICAL DATA:  Follow-up of CABG on 07/07/2019.  Pleural effusions. EXAM: CHEST - 2 VIEW COMPARISON:  07/10/2019 FINDINGS: The heart size and mediastinal contours are within normal limits. CABG. Both lungs are clear. The visualized skeletal structures are unremarkable. Complete resolution of the bilateral pleural effusions. No pneumothorax. IMPRESSION: No active cardiopulmonary disease. Resolution of small bilateral pleural effusions. Electronically Signed   By: Lorriane Shire M.D.   On: 08/08/2019 12:38    Assessment & Plan:   Problem List Items Addressed This Visit     Coronary atherosclerosis   Relevant Medications   valsartan (DIOVAN) 80  MG tablet   tadalafil (CIALIS) 5 MG tablet   ezetimibe (ZETIA) 10 MG tablet   Essential hypertension    On Valartan 80 mg/d      Relevant Medications   valsartan (DIOVAN) 80 MG tablet   tadalafil (CIALIS) 5 MG tablet   ezetimibe (ZETIA) 10 MG tablet   Hypercholesteremia  On Zetia      Relevant Medications   valsartan (DIOVAN) 80 MG tablet   tadalafil (CIALIS) 5 MG tablet   ezetimibe (ZETIA) 10 MG tablet   S/P CABG x 2    F/u w/Dr Harrell Gave S/p CABG x 2 utilizing LIMA to LAD and Free RIMA to distal RCA.    Continue with Zetia and aspirin      Statin myopathy    On diet and Zetia      Type 2 diabetes mellitus without obesity ([place])    Dr Kelton Pillar On diet. Metformin if needed      Relevant Medications   valsartan (DIOVAN) 80 MG tablet   Other Visit Diagnoses     Essential (primary) hypertension       Relevant Medications   valsartan (DIOVAN) 80 MG tablet   tadalafil (CIALIS) 5 MG tablet   ezetimibe (ZETIA) 10 MG tablet         Meds ordered this encounter  Medications   valsartan (DIOVAN) 80 MG tablet    Sig: Take 1 tablet (80 mg total) by mouth daily.    Dispense:  90 tablet    Refill:  3   tadalafil (CIALIS) 5 MG tablet    Sig: 1 po qd    Dispense:  90 tablet    Refill:  3   pantoprazole (PROTONIX) 40 MG tablet    Sig: Take 1 tablet (40 mg total) by mouth daily.    Dispense:  90 tablet    Refill:  3   hydrOXYzine (ATARAX/VISTARIL) 25 MG tablet    Sig: Take 1 tablet (25 mg total) by mouth every 8 (eight) hours as needed for itching.    Dispense:  180 tablet    Refill:  0   ezetimibe (ZETIA) 10 MG tablet    Sig: Take 1 tablet (10 mg total) by mouth daily. Please schedule appointment for future refills. Thank you    Dispense:  90 tablet    Refill:  3       Follow-up: Return in about 6 months (around 07/03/2021) for Wellness Exam.  Walker Kehr, MD

## 2021-01-03 NOTE — Assessment & Plan Note (Signed)
Dr Kelton Pillar On diet. Metformin if needed

## 2021-01-03 NOTE — Assessment & Plan Note (Addendum)
F/u w/Dr Harrell Gave S/p CABG x 2 utilizing LIMA to LAD and Free RIMA to distal RCA.    Continue with Zetia and aspirin

## 2021-01-03 NOTE — Assessment & Plan Note (Signed)
On Zetia 

## 2021-01-03 NOTE — Assessment & Plan Note (Signed)
On Valartan 80 mg/d

## 2021-01-14 ENCOUNTER — Other Ambulatory Visit: Payer: Self-pay | Admitting: Internal Medicine

## 2021-01-14 MED ORDER — PANTOPRAZOLE SODIUM 40 MG PO TBEC: 40.0000 mg | DELAYED_RELEASE_TABLET | Freq: Every day | ORAL | 3 refills | Status: DC

## 2021-01-14 NOTE — Addendum Note (Signed)
Addended by: Earnstine Regal on: 01/14/2021 01:43 PM   Modules accepted: Orders

## 2021-01-16 ENCOUNTER — Telehealth: Payer: Self-pay | Admitting: Internal Medicine

## 2021-01-16 MED ORDER — PANTOPRAZOLE SODIUM 40 MG PO TBEC: 40.0000 mg | DELAYED_RELEASE_TABLET | Freq: Every day | ORAL | 3 refills | Status: DC

## 2021-01-16 NOTE — Telephone Encounter (Signed)
Rx was approved and sent back electronically 11/22 & this morning. [facility]

## 2021-01-16 NOTE — Telephone Encounter (Signed)
Patient checking status of refill request  Informed patient refill request can take up to 3bds

## 2021-01-16 NOTE — Addendum Note (Signed)
Addended by: Earnstine Regal on: 01/16/2021 11:02 AM   Modules accepted: Orders

## 2021-03-26 ENCOUNTER — Encounter: Payer: Self-pay | Admitting: Internal Medicine

## 2021-06-11 IMAGING — DX DG CHEST 2V
2 series · 2 of 2 positions shown · non-contrast
Comparison: 02/24/2004

CLINICAL DATA: Chest pain and pressure since 5955 hours off and on,
LEFT arm pain, dizziness, nausea, history diabetes mellitus,
hypertension

EXAM:
CHEST - 2 VIEW

[w chest pa]
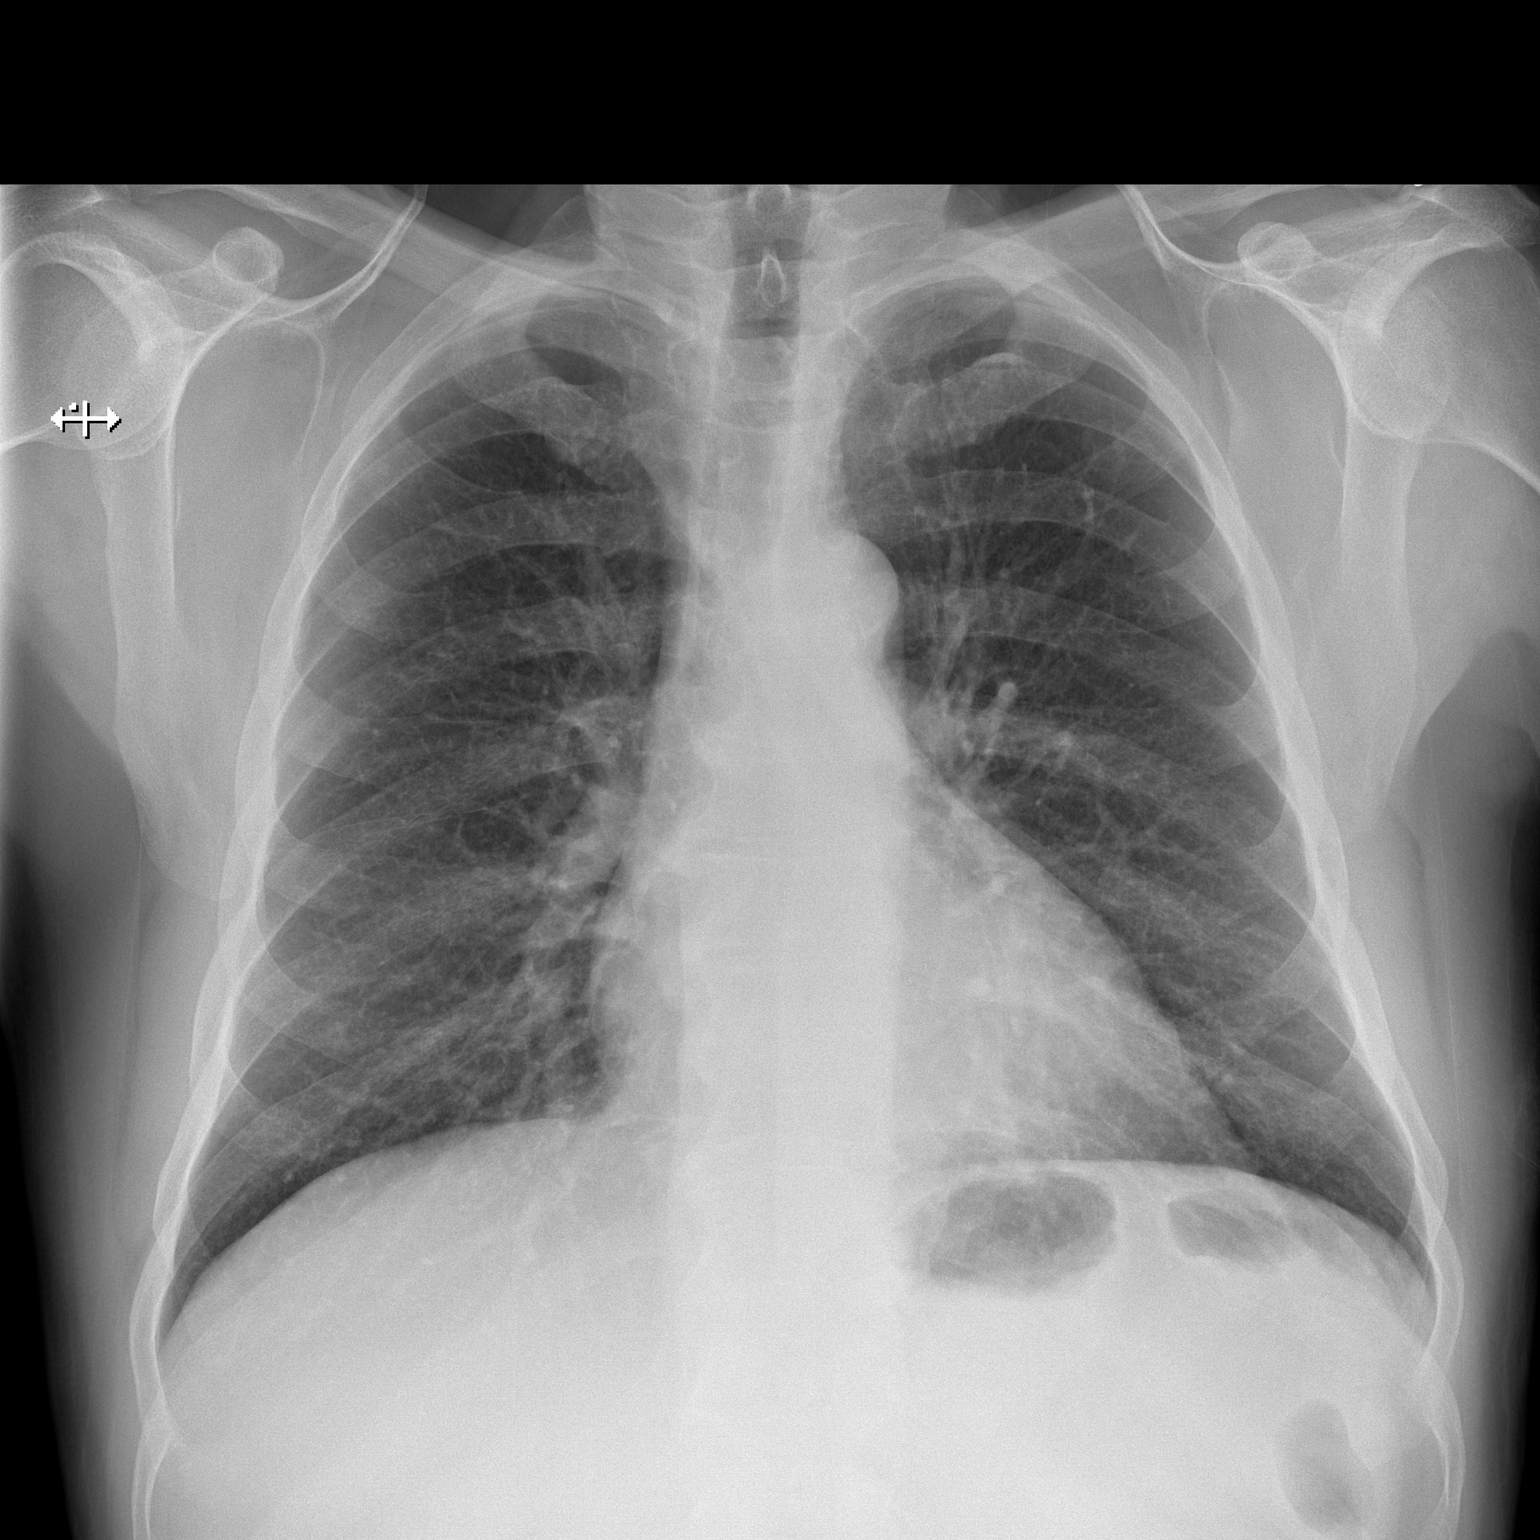

[w chest lat]
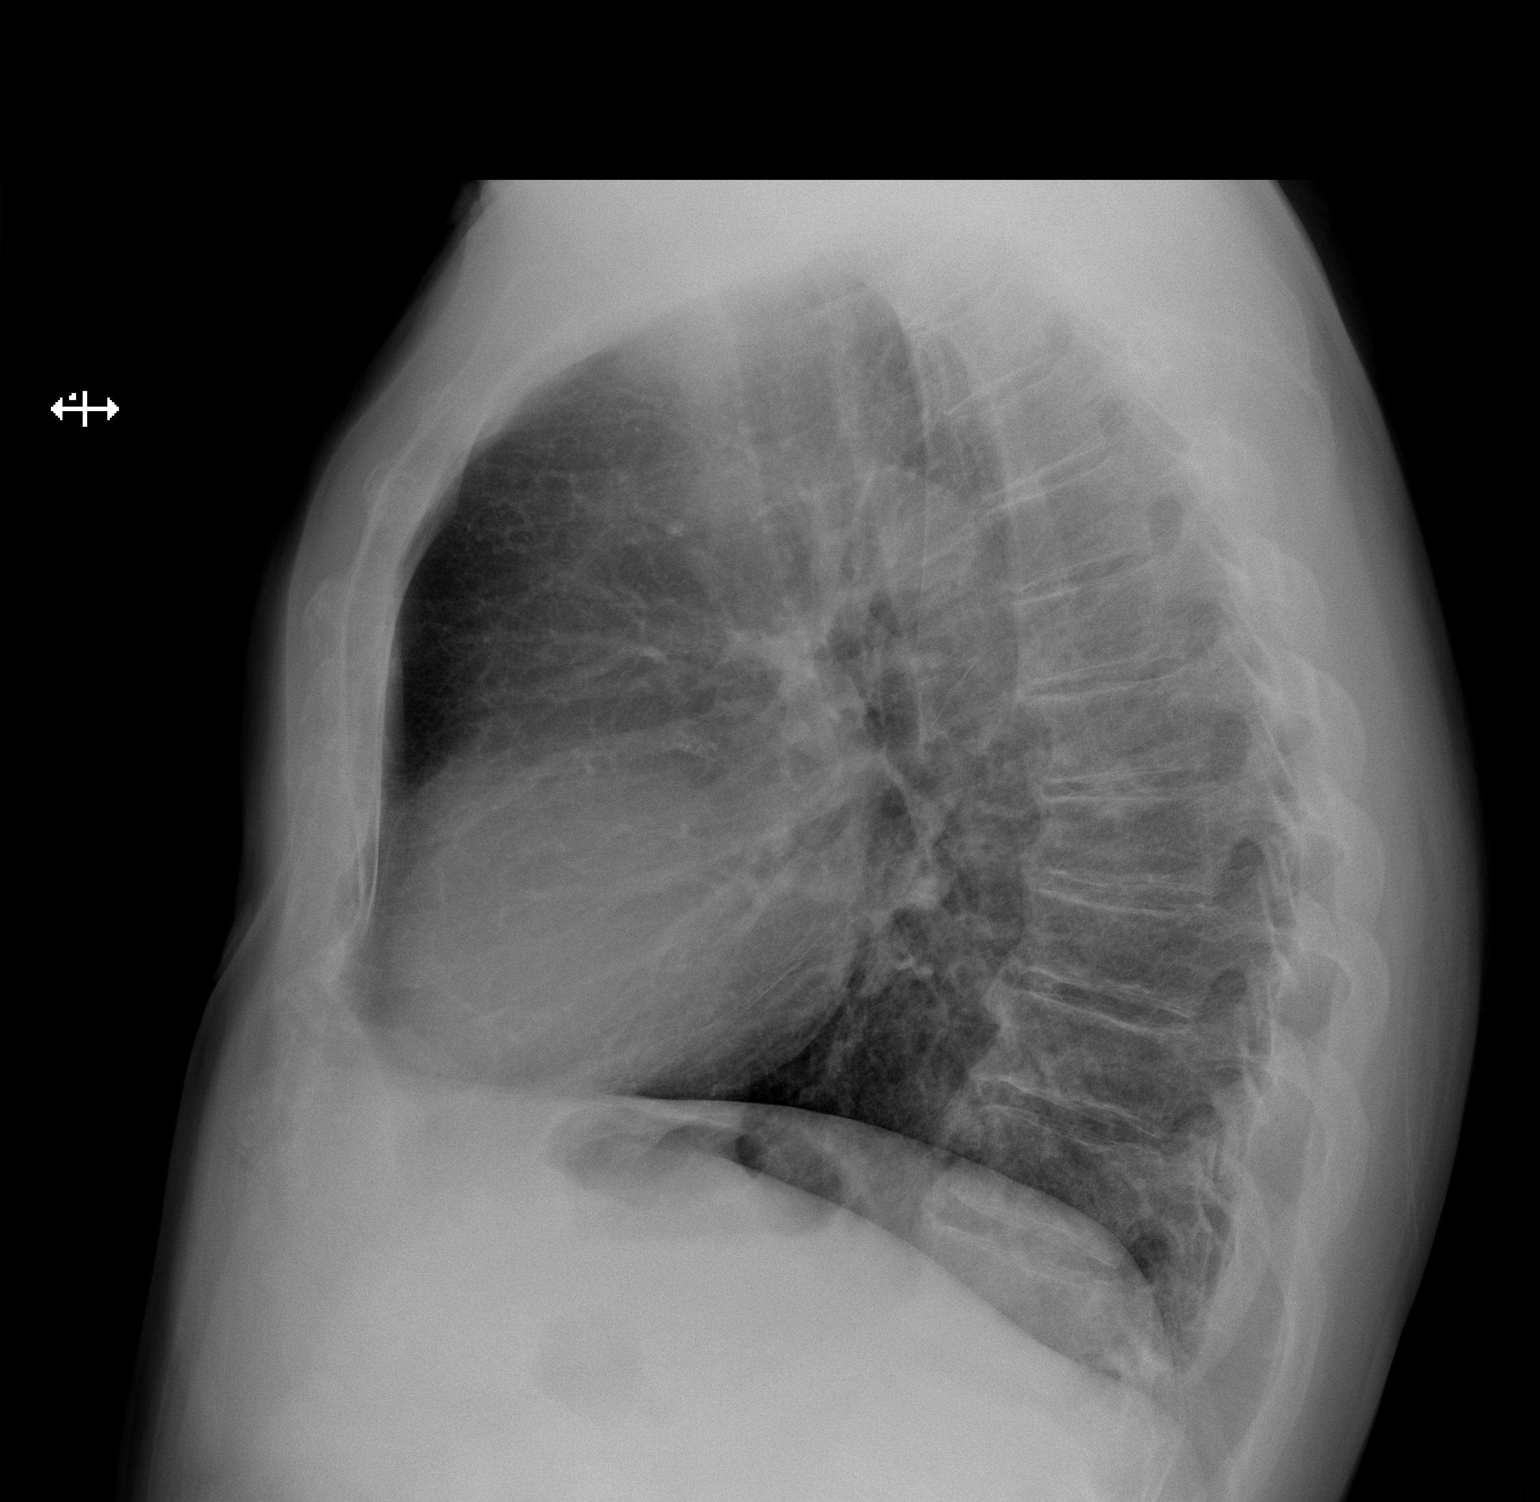

[2 of 2 positions shown; findings below may reference images not displayed]

FINDINGS: Normal heart size, mediastinal contours, and pulmonary vascularity.

Lungs clear.

No pleural effusion or pneumothorax.

Bones unremarkable.
IMPRESSION: Normal exam.

## 2021-06-13 IMAGING — DX DG CHEST 1V PORT
1 series · 1 of 1 positions shown · non-contrast
Comparison: 07/05/2019

CLINICAL DATA: Status post coronary bypass grafting

EXAM:
PORTABLE CHEST 1 VIEW

[chest ap]
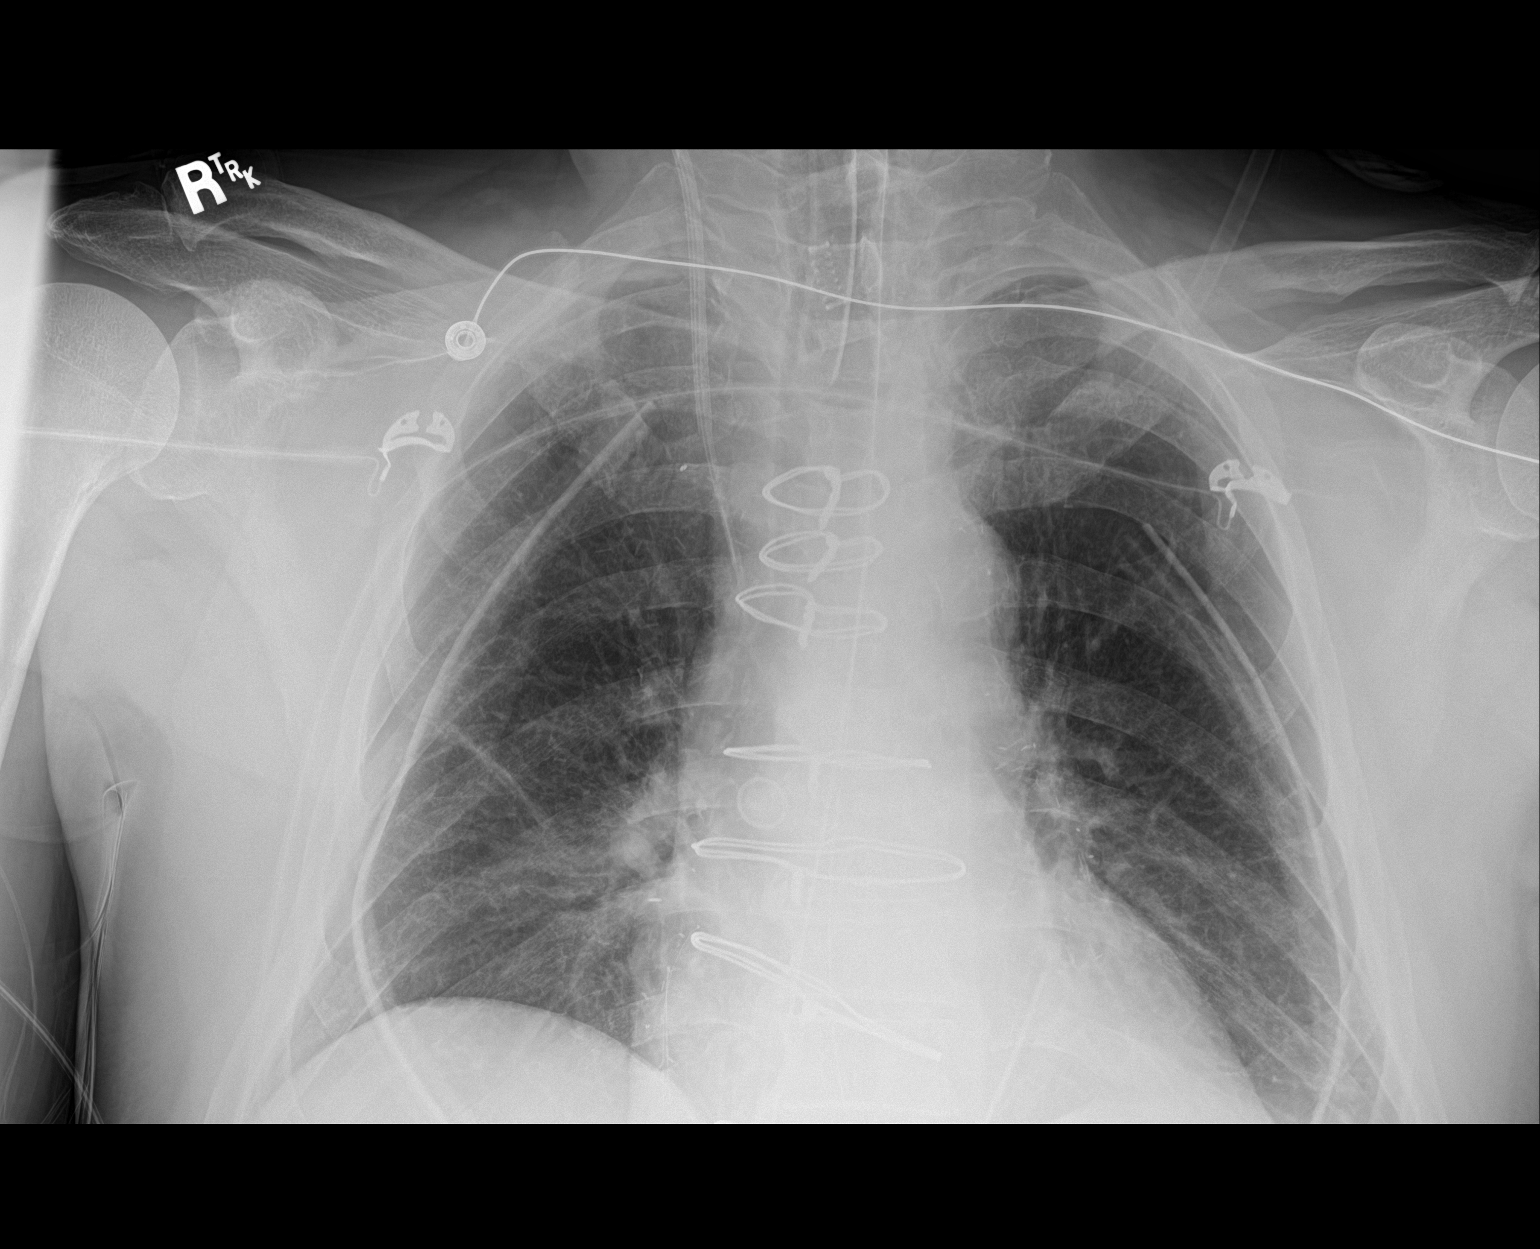

[1 of 1 positions shown; findings below may reference images not displayed]

FINDINGS: Postsurgical changes are now seen. Endotracheal tube, gastric
catheter, pericardial drain and bilateral thoracostomy catheters are
seen. Right jugular central line is noted in the mid superior vena
cava. No pneumothorax is seen. No sizable infiltrate or effusion is
noted. Mediastinal drain is noted as well.
IMPRESSION: Tubes and lines as described above.

No acute postoperative abnormality is seen.

## 2021-06-14 IMAGING — DX DG CHEST 1V PORT
1 series · 1 of 1 positions shown · non-contrast
Comparison: Chest x-ray from yesterday.

CLINICAL DATA: Status post CABG.

EXAM:
PORTABLE CHEST 1 VIEW

[chest]
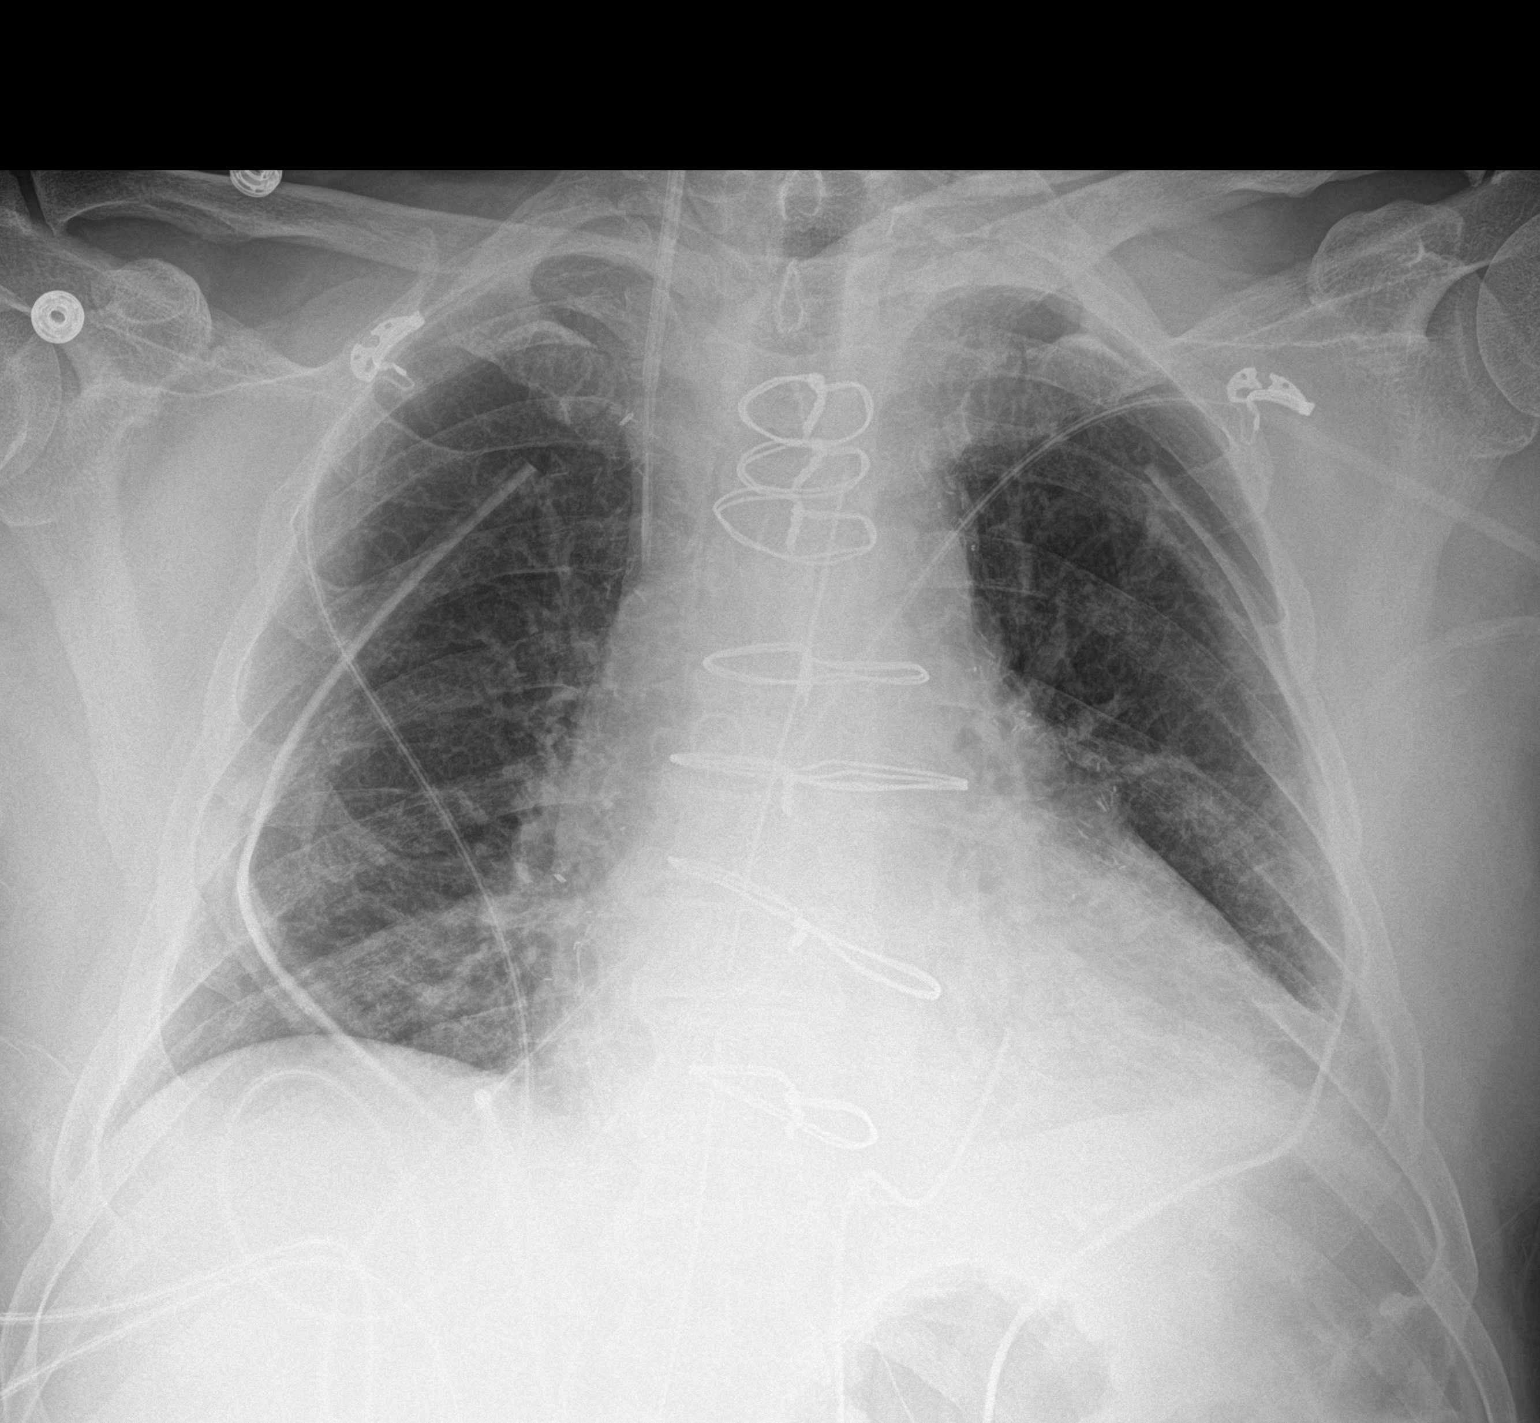

[1 of 1 positions shown; findings below may reference images not displayed]

FINDINGS: Interval removal of the endotracheal and enteric tubes. Unchanged
right internal jugular central venous catheter, mediastinal drain,
and bilateral chest tubes.

Stable cardiomediastinal silhouette status post CABG. Normal
pulmonary vascularity. Unchanged mild left basilar atelectasis. No
pleural effusion or pneumothorax. No acute osseous abnormality.
IMPRESSION: 1. Interval extubation. Stable chest with mild left basilar
atelectasis.

## 2021-07-03 ENCOUNTER — Ambulatory Visit: Payer: Managed Care, Other (non HMO) | Admitting: Internal Medicine

## 2021-08-16 ENCOUNTER — Telehealth: Payer: Self-pay | Admitting: Cardiology

## 2021-08-16 DIAGNOSIS — I10 Essential (primary) hypertension: Secondary | ICD-10-CM

## 2021-08-16 MED ORDER — VALSARTAN 80 MG PO TABS: 80.0000 mg | ORAL_TABLET | Freq: Every day | ORAL | 2 refills | Status: DC

## 2021-09-17 ENCOUNTER — Encounter (HOSPITAL_BASED_OUTPATIENT_CLINIC_OR_DEPARTMENT_OTHER): Payer: Self-pay | Admitting: Cardiology

## 2021-09-17 ENCOUNTER — Ambulatory Visit (HOSPITAL_BASED_OUTPATIENT_CLINIC_OR_DEPARTMENT_OTHER): Payer: Managed Care, Other (non HMO) | Admitting: Cardiology

## 2021-09-17 VITALS — BP 124/80 | HR 75 | Ht 73.5 in | Wt 219.3 lb

## 2021-09-17 DIAGNOSIS — Z7189 Other specified counseling: Secondary | ICD-10-CM

## 2021-09-17 DIAGNOSIS — Z789 Other specified health status: Secondary | ICD-10-CM | POA: Diagnosis not present

## 2021-09-17 DIAGNOSIS — I10 Essential (primary) hypertension: Secondary | ICD-10-CM

## 2021-09-17 DIAGNOSIS — E119 Type 2 diabetes mellitus without complications: Secondary | ICD-10-CM

## 2021-09-17 DIAGNOSIS — I2510 Atherosclerotic heart disease of native coronary artery without angina pectoris: Secondary | ICD-10-CM

## 2021-09-17 DIAGNOSIS — E7800 Pure hypercholesterolemia, unspecified: Secondary | ICD-10-CM | POA: Diagnosis not present

## 2021-09-17 DIAGNOSIS — Z951 Presence of aortocoronary bypass graft: Secondary | ICD-10-CM

## 2021-09-17 MED ORDER — NEXLETOL 180 MG PO TABS: 180.0000 mg | ORAL_TABLET | Freq: Every day | ORAL | 0 refills | Status: DC

## 2021-09-17 MED ORDER — VALSARTAN 80 MG PO TABS: 80.0000 mg | ORAL_TABLET | Freq: Every day | ORAL | 3 refills | Status: DC

## 2021-09-17 NOTE — Patient Instructions (Signed)
Medication Instructions:  Try Nexletol 180 mg daily (Samples provided today)  *If you need a refill on your cardiac medications before your next appointment, please call your pharmacy*   Lab Work: None ordered today   Testing/Procedures: None ordered today   Follow-Up: At [facility], you and your health needs are our priority.  As part of our continuing mission to provide you with exceptional heart care, we have created designated Provider Care Teams.  These Care Teams include your primary Cardiologist (physician) and Advanced Practice Providers (APPs -  Physician Assistants and Nurse Practitioners) who all work together to provide you with the care you need, when you need it.  We recommend signing up for the patient portal called "MyChart".  Sign up information is provided on this After Visit Summary.  MyChart is used to connect with patients for Virtual Visits (Telemedicine).  Patients are able to view lab/test results, encounter notes, upcoming appointments, etc.  Non-urgent messages can be sent to your provider as well.   To learn more about what you can do with MyChart, go to [email or web address].    Your next appointment:   1 year(s)  The format for your next appointment:   In Person  Provider:   Buford Dresser, MD{

## 2021-09-17 NOTE — Progress Notes (Signed)
Cardiology Office Note:    Date:  09/17/2021   ID:  Corey [name], DOB 1950-12-03, MRN [id number]  PCP:  Cassandria Anger, MD  Cardiologist:  Buford Dresser, MD  Referring MD: Cassandria Anger, MD   CC: follow up  History of Present Illness:    Corey [name] is a 71 y.o. male with a hx of CAD s/p CABG, hypertension, type II diabetes, dyslipidemia, recurrent kidney stones who is seen for follow up today. He was initially seen 06/16/19 as a new consult at the request of Plotnikov, Evie Lacks, MD for the evaluation and management of elevated calcium score.  Cardiac history: Initially seen 05/2019 for calcium score of 1275. Admitted for NSTEMI 06/2019, cath with severe 2V CAD (90% ostial LAD, 100% pRCA). S/P CABG 07/07/19 (Dr. Roxy Manns, LIMA-LAD, Free RIMA-RCA). Risk factors include hypertension, hyperlipidemia, type II diabetes. Statin intolerant. Does tolerate ezetimibe. Concerned about PCSK9i injection due to his chronic hives.   Today: He states that he is feeling fine. He denies any recent indications or symptoms that he has any issues with his heart.  He is scheduled to follow up with his endocrinologist regarding high blood sugars lately. He tried Metformin three times but developed pain in his chest each time. Otherwise he denies any chest pain or shortness of breath. He states he would have to run for a mile before needing to stop due to becoming short winded.  He continues to tolerate ezetimibe. Typically his adverse reactions such as chronic hives will manifest within 1-2 days of starting a medication. He has had severe intolerance to statins. He did try bempedoic acid in 2021, noted hematuria and hives. However, he was also on clopidogrel at that time. No anaphylaxis.   He denies any palpitations, or peripheral edema. No lightheadedness, headaches, syncope, orthopnea, or PND.   Past Medical History:  Diagnosis Date   Allergic rhinitis    Allergy    Arthritis     Asthma    Barrett's esophagus    Cataract    removed both eyes with lens replacement Dr Katy Fitch   Complication of anesthesia    "took them 6 hours to wake me up"   Diabetes mellitus without complication (HCC)    GERD (gastroesophageal reflux disease)    Heart murmur    History of kidney stones    HTN (hypertension)    Hyperlipidemia    Pneumonia    as a kid    Past Surgical History:  Procedure Laterality Date   CATARACT EXTRACTION, BILATERAL     with lens implants    COLONOSCOPY  01/15/2009   normal    CORONARY ARTERY BYPASS GRAFT N/A 07/07/2019   Procedure: CORONARY ARTERY BYPASS GRAFTING (CABG) using LIMA to LAD; Free IMA to RCA.;  Surgeon: Rexene Alberts, MD;  Location: [facility];  Service: Open Heart Surgery;  Laterality: N/A;  BILATERAL IMA   ENDOVEIN HARVEST OF GREATER SAPHENOUS VEIN Right 07/07/2019   Procedure: [place] Of Greater Saphenous Vein;  Surgeon: Rexene Alberts, MD;  Location: [facility];  Service: Open Heart Surgery;  Laterality: Right;   ESOPHAGOGASTRODUODENOSCOPY  03/20/2006   EYE SURGERY     KIDNEY STONE SURGERY     x 4 - most recent 04-2018 at [facility]    LEFT HEART CATH AND CORONARY ANGIOGRAPHY N/A 07/05/2019   Procedure: LEFT HEART CATH AND CORONARY ANGIOGRAPHY;  Surgeon: [name], Peter M, MD;  Location: [facility];  Service: Cardiovascular;  Laterality: N/A;   TEE WITHOUT CARDIOVERSION N/A 07/07/2019   Procedure: TRANSESOPHAGEAL ECHOCARDIOGRAM (TEE);  Surgeon: Rexene Alberts, MD;  Location: [facility];  Service: Open Heart Surgery;  Laterality: N/A;   TONSILLECTOMY     TOTAL HIP ARTHROPLASTY Right 04/30/2016   Procedure: RIGHT TOTAL HIP ARTHROPLASTY ANTERIOR APPROACH;  Surgeon: Gaynelle Arabian, MD;  Location: [facility];  Service: Orthopedics;  Laterality: Right;  requests 139mns   TRANSTHORACIC ECHOCARDIOGRAM  01/05/1998   UPPER GASTROINTESTINAL ENDOSCOPY      Current Medications: Current Outpatient Medications on File Prior to Visit   Medication Sig   Ascorbic Acid (VITAMIN C) 1000 MG tablet Take 1,000 mg by mouth 3 (three) times daily.   aspirin EC 81 MG EC tablet Take 1 tablet (81 mg total) by mouth daily.   cetirizine (ZYRTEC) 10 MG tablet Take 10 mg by mouth 2 (two) times daily. Takes twice daily   cholecalciferol (VITAMIN D3) 25 MCG (1000 UT) tablet Take 5,000 Units by mouth daily.   ezetimibe (ZETIA) 10 MG tablet Take 1 tablet (10 mg total) by mouth daily. Please schedule appointment for future refills. Thank you   hydrOXYzine (ATARAX/VISTARIL) 25 MG tablet Take 1 tablet (25 mg total) by mouth every 8 (eight) hours as needed for itching.   Magnesium 400 MG CAPS Take by mouth 3 (three) times daily.   pantoprazole (PROTONIX) 40 MG tablet Take 1 tablet (40 mg total) by mouth daily.   tadalafil (CIALIS) 5 MG tablet 1 po qd   Zinc 22.5 MG TABS Take by mouth 2 (two) times daily.   No current facility-administered medications on file prior to visit.     Allergies:   Nexletol [bempedoic acid], Albuterol, Aspirin, Ciprofloxacin, Clarithromycin, Colesevelam, Enalapril maleate, Esomeprazole magnesium, Niacin, Pravastatin sodium, Rosuvastatin, and Penicillins   Social History   Tobacco Use   Smoking status: Never   Smokeless tobacco: Never  Vaping Use   Vaping Use: Never used  Substance Use Topics   Alcohol use: No   Drug use: No    Family History: family history includes Colon polyps in his mother; Diabetes in his mother; Heart disease in his father and mother; Hypertension in an other family member. There is no history of Colon cancer, Esophageal cancer, Rectal cancer, or Stomach cancer.  ROS:   Please see the history of present illness.   Additional pertinent ROS otherwise unremarkable.  EKGs/Labs/Other Studies Reviewed:    The following studies were reviewed today:  Echocardiogram  05/22/2020:  1. Left ventricular ejection fraction by 3D volume is 57 %. The left  ventricle has normal function. The left  ventricle has no regional wall  motion abnormalities. Left ventricular diastolic parameters are consistent  with Grade I diastolic dysfunction (impaired relaxation).   2. Right ventricular systolic function is normal. The right ventricular  size is normal. There is normal pulmonary artery systolic pressure. The  estimated right ventricular systolic pressure is 104.5mmHg.   3. The mitral valve is normal in structure. Trivial mitral valve  regurgitation. No evidence of mitral stenosis.   4. The aortic valve is grossly normal. There is mild calcification of the  aortic valve. Aortic valve regurgitation is not visualized. No aortic  stenosis is present.   5. The inferior vena cava is normal in size with greater than 50%  respiratory variability, suggesting right atrial pressure of 3 mmHg.  Echo 07/06/19 1. Left ventricular ejection fraction, by estimation, is 45 to 50%. The  left ventricle has mildly decreased  function. The left ventricle has no  regional wall motion abnormalities. Left ventricular diastolic parameters  are consistent with Grade I  diastolic dysfunction (impaired relaxation).   2. Right ventricular systolic function is normal. The right ventricular  size is normal.   3. The mitral valve is normal in structure. No evidence of mitral valve  regurgitation. No evidence of mitral stenosis.   4. The aortic valve is normal in structure. Aortic valve regurgitation is  not visualized. No aortic stenosis is present.   5. The inferior vena cava is normal in size with greater than 50%  respiratory variability, suggesting right atrial pressure of 3 mmHg.   Cath 07/05/19 Mid LM to Dist LM lesion is 30% stenosed. Ost LAD to Prox LAD lesion is 90% stenosed. Prox RCA lesion is 100% stenosed. The left ventricular systolic function is normal. LV end diastolic pressure is normal. The left ventricular ejection fraction is 50-55% by visual estimate. There is no mitral valve regurgitation.    1. Severe 2 vessel CAD     - 90% ostial LAD stenosis. Heavily calcified    - 100% proximal RCA with left to right collateral. Vessel is also heavily calcified. 2. Inferior hypokinesis with overall well preserved LV function 3. Normal LVEDP   Diagnostic: Dominance: Right    Plan: patient has complex CAD with total occlusion of a large RCA with collaterals and ostial LAD which supplies a large LAD and large diagonal. It would be difficult to manage this with PCI since stenting of the LAD would require jailing the LCx and could compromise this vessel. I would recommend consideration for CABG. CT surgery consulted.   Calcium score 06/08/19 Coronary calcium score of 1275. This was 40 percentile for age and sex matched control.  EKG:  EKG is personally reviewed.   09/17/2021:  NSR at 75 bpm, borderline LVH 04/30/20: NSR at 97 bpm  Recent Labs: 11/06/2020: BUN 20; Creatinine, Ser 1.41; Potassium 4.3; Sodium 136   Recent Lipid Panel    Component Value Date/Time   CHOL 177 11/06/2020 0822   CHOL 206 (H) 09/09/2019 0825   TRIG 206.0 (H) 11/06/2020 0822   HDL 48.90 11/06/2020 0822   HDL 46 09/09/2019 0825   CHOLHDL 4 11/06/2020 0822   VLDL 41.2 (H) 11/06/2020 0822   LDLCALC 142 (H) 09/09/2019 0825   LDLDIRECT 119.0 11/06/2020 0822    Physical Exam:    VS:  BP 124/80 (BP Location: Right Arm, Patient Position: Sitting, Cuff Size: Normal)   Pulse 75   Ht 6' 1.5" (1.867 m)   Wt 219 lb 4.8 oz (99.5 kg)   BMI 28.54 kg/m     Wt Readings from Last 3 Encounters:  09/17/21 219 lb 4.8 oz (99.5 kg)  01/03/21 215 lb (97.5 kg)  11/06/20 214 lb (97.1 kg)    GEN: Well nourished, well developed in no acute distress HEENT: Normal, moist mucous membranes NECK: No JVD CARDIAC: regular rhythm, normal S1 and S2, no rubs or gallops. No murmur. VASCULAR: Radial and DP pulses 2+ bilaterally. No carotid bruits RESPIRATORY:  Clear to auscultation without rales, wheezing or rhonchi  ABDOMEN: Soft,  non-tender, non-distended MUSCULOSKELETAL:  Ambulates independently SKIN: Warm and dry, no edema NEUROLOGIC:  Alert and oriented x 3. No focal neuro deficits noted. PSYCHIATRIC:  Normal affect     ASSESSMENT:    1. Coronary artery disease involving native coronary artery of native heart without angina pectoris   2. Essential (primary) hypertension   3. Statin  intolerance   4. Hypercholesteremia   5. S/P CABG x 2   6. Type 2 diabetes mellitus without obesity ([place])   7. Counseling on health promotion and disease prevention     PLAN:    CAD, s/p CABG 06/2019, Dr. Roxy Manns (LIMA to LAD, Free RIMA to RCA with Short Reversed Greater Saphenous Vein Interposition at Proximal Anastomosis) -doing very well, no chest pain -on aspirin 81 mg -completed 1 year of clopidogrel -now off metoprolol succinate 25 mg daily (patient preference) -continue to struggle with lipid management, see below  Cardiomyopathy, likely ischemic: pre-CABG EF 45-50%, normalized on repeat echo -on ARB, no longer on metoprolol succinate as above -NYHA class 1  hypercholesterolemia, with LDL goal <70 given CAD -has history of multiple statin intolerances. Tolerates ezetimibe. Tried nexletol in 2021, had hematuria and hives. He is willing to trial again, sample given today. He will contact me if he tolerates this -we have discussed PCSK9i. He is very concerned about an injectable and being unable to "stop" the medical if he has a reaction. Declines for now, but with history of hives, would prefer to get first injection in clinic to be monitored if this is the only option in the future  Hypertension: goal <130/80 -at goal today, continue valsartan  Type II diabetes, without obesity: -has not tolerated metformin, gave him chest pain -in the setting of CAD, discussed recommendation for SGLT2i and GLP1RA. He will consider if he requires daily diabetes medications in the future. My preference would be SGLT2i given likely  ischemic cardiomyopathy  Cardiac risk counseling and prevention recommendations: -recommend heart healthy/Mediterranean diet, with whole grains, fruits, vegetable, fish, lean meats, nuts, and olive oil. Limit salt. -recommend moderate walking, 3-5 times/week for 30-50 minutes each session. Aim for at least 150 minutes.week. Goal should be pace of 3 miles/hours, or walking 1.5 miles in 30 minutes -recommend avoidance of tobacco products. Avoid excess alcohol.  Plan for follow up: 1 year or sooner as needed.   Buford Dresser, MD, PhD [place]  [facility]    Medication Adjustments/Labs and Tests Ordered: Current medicines are reviewed at length with the patient today.  Concerns regarding medicines are outlined above.   Orders Placed This Encounter  Procedures   EKG 12-Lead   Meds ordered this encounter  Medications   valsartan (DIOVAN) 80 MG tablet    Sig: Take 1 tablet (80 mg total) by mouth daily.    Dispense:  90 tablet    Refill:  3   Bempedoic Acid (NEXLETOL) 180 MG TABS    Sig: Take 180 mg by mouth daily.    Dispense:  7 tablet    Refill:  0   Patient Instructions  Medication Instructions:  Try Nexletol 180 mg daily (Samples provided today)  *If you need a refill on your cardiac medications before your next appointment, please call your pharmacy*   Lab Work: None ordered today   Testing/Procedures: None ordered today   Follow-Up: At [facility], you and your health needs are our priority.  As part of our continuing mission to provide you with exceptional heart care, we have created designated Provider Care Teams.  These Care Teams include your primary Cardiologist (physician) and Advanced Practice Providers (APPs -  Physician Assistants and Nurse Practitioners) who all work together to provide you with the care you need, when you need it.  We recommend signing up for the patient portal called "MyChart".  Sign up information is provided on this  After  Visit Summary.  MyChart is used to connect with patients for Virtual Visits (Telemedicine).  Patients are able to view lab/test results, encounter notes, upcoming appointments, etc.  Non-urgent messages can be sent to your provider as well.   To learn more about what you can do with MyChart, go to [email or web address].    Your next appointment:   1 year(s)  The format for your next appointment:   In Person  Provider:   Buford Dresser, MD{         I,Mathew Stumpf,acting as a scribe for Buford Dresser, MD.,have documented all relevant documentation on the behalf of Buford Dresser, MD,as directed by  Buford Dresser, MD while in the presence of Buford Dresser, MD.  I, Buford Dresser, MD, have reviewed all documentation for this visit. The documentation on 09/17/21 for the exam, diagnosis, procedures, and orders are all accurate and complete.   Signed, Buford Dresser, MD PhD 09/17/2021  [place]

## 2021-10-03 ENCOUNTER — Other Ambulatory Visit: Payer: Self-pay | Admitting: *Deleted

## 2021-10-03 MED ORDER — TADALAFIL 5 MG PO TABS: 5.0000 mg | ORAL_TABLET | Freq: Every day | ORAL | 0 refills | Status: DC | PRN

## 2021-11-04 ENCOUNTER — Telehealth: Payer: Self-pay | Admitting: Cardiology

## 2021-11-04 DIAGNOSIS — E7800 Pure hypercholesterolemia, unspecified: Secondary | ICD-10-CM

## 2021-11-04 DIAGNOSIS — I2510 Atherosclerotic heart disease of native coronary artery without angina pectoris: Secondary | ICD-10-CM

## 2021-11-04 MED ORDER — EZETIMIBE 10 MG PO TABS: 10.0000 mg | ORAL_TABLET | Freq: Every day | ORAL | 2 refills | Status: DC

## 2021-11-04 NOTE — Telephone Encounter (Signed)
Rx(s) sent to pharmacy electronically.  

## 2021-11-04 NOTE — Telephone Encounter (Signed)
*  STAT* If patient is at the pharmacy, call can be transferred to refill team.   1. Which medications need to be refilled? (please list name of each medication and dose if known) ezetimibe (ZETIA) 10 MG tablet  2. Which pharmacy/location (including street and city if local pharmacy) is medication to be sent to?  [facility] [place]    3. Do they need a 30 day or 90 day supply?  [place]

## 2021-11-04 NOTE — Telephone Encounter (Signed)
Pt c/o medication issue:  1. Name of Medication:   Bempedoic Acid (NEXLETOL) 180 MG TABS    2. How are you currently taking this medication (dosage and times per day)?   3. Are you having a reaction (difficulty breathing--STAT)?   4. What is your medication issue? Pt calling stating he is still have kidney problems with the medication

## 2021-11-04 NOTE — Telephone Encounter (Signed)
Attempted to return call to patient, no answer, left message to call back.  

## 2021-11-06 NOTE — Telephone Encounter (Signed)
2nd call attempt, no answer, unable to leave message

## 2021-11-07 NOTE — Telephone Encounter (Signed)
3rd call attempt, no answer, unable to leave message, removing from triage pool

## 2021-11-12 ENCOUNTER — Ambulatory Visit: Payer: Managed Care, Other (non HMO) | Admitting: Internal Medicine

## 2021-12-31 ENCOUNTER — Ambulatory Visit: Payer: Managed Care, Other (non HMO) | Admitting: Internal Medicine

## 2021-12-31 ENCOUNTER — Encounter: Payer: Self-pay | Admitting: Internal Medicine

## 2021-12-31 VITALS — BP 122/72 | HR 88 | Temp 98.3°F | Ht 73.5 in | Wt 222.0 lb

## 2021-12-31 DIAGNOSIS — E7800 Pure hypercholesterolemia, unspecified: Secondary | ICD-10-CM

## 2021-12-31 DIAGNOSIS — I2510 Atherosclerotic heart disease of native coronary artery without angina pectoris: Secondary | ICD-10-CM | POA: Diagnosis not present

## 2021-12-31 DIAGNOSIS — Z23 Encounter for immunization: Secondary | ICD-10-CM

## 2021-12-31 DIAGNOSIS — Z0000 Encounter for general adult medical examination without abnormal findings: Secondary | ICD-10-CM | POA: Diagnosis not present

## 2021-12-31 DIAGNOSIS — E1165 Type 2 diabetes mellitus with hyperglycemia: Secondary | ICD-10-CM

## 2021-12-31 LAB — URINALYSIS
Bilirubin Urine: NEGATIVE
Hgb urine dipstick: NEGATIVE
Ketones, ur: NEGATIVE
Leukocytes,Ua: NEGATIVE
Nitrite: NEGATIVE
Specific Gravity, Urine: 1.01 (ref 1.000–1.030)
Total Protein, Urine: NEGATIVE
Urine Glucose: NEGATIVE
Urobilinogen, UA: 0.2 (ref 0.0–1.0)
pH: 6 (ref 5.0–8.0)

## 2021-12-31 LAB — MICROALBUMIN / CREATININE URINE RATIO
Creatinine,U: 100.6 mg/dL
Microalb Creat Ratio: 1.3 mg/g (ref 0.0–30.0)
Microalb, Ur: 1.3 mg/dL (ref 0.0–1.9)

## 2021-12-31 LAB — LIPID PANEL
Cholesterol: 195 mg/dL (ref 0–200)
HDL: 50 mg/dL (ref >39.00)
LDL Cholesterol: 111 mg/dL — ABNORMAL HIGH (ref 0–99)
NonHDL: 144.54
Total CHOL/HDL Ratio: 4
Triglycerides: 166 mg/dL — ABNORMAL HIGH (ref 0.0–149.0)
VLDL: 33.2 mg/dL (ref 0.0–40.0)

## 2021-12-31 LAB — CBC WITH DIFFERENTIAL/PLATELET
Basophils Absolute: 0 10*3/uL (ref 0.0–0.1)
Basophils Relative: 0.5 % (ref 0.0–3.0)
Eosinophils Absolute: 0.1 10*3/uL (ref 0.0–0.7)
Eosinophils Relative: 1.8 % (ref 0.0–5.0)
HCT: 43.7 % (ref 39.0–52.0)
Hemoglobin: 14.6 g/dL (ref 13.0–17.0)
Lymphocytes Relative: 22.8 % (ref 12.0–46.0)
Lymphs Abs: 1.1 10*3/uL (ref 0.7–4.0)
MCHC: 33.3 g/dL (ref 30.0–36.0)
MCV: 86.9 fl (ref 78.0–100.0)
Monocytes Absolute: 0.4 10*3/uL (ref 0.1–1.0)
Monocytes Relative: 8.7 % (ref 3.0–12.0)
Neutro Abs: 3.1 10*3/uL (ref 1.4–7.7)
Neutrophils Relative %: 66.2 % (ref 43.0–77.0)
Platelets: 180 10*3/uL (ref 150.0–400.0)
RBC: 5.03 Mil/uL (ref 4.22–5.81)
RDW: 14.2 % (ref 11.5–15.5)
WBC: 4.6 10*3/uL (ref 4.0–10.5)

## 2021-12-31 LAB — COMPREHENSIVE METABOLIC PANEL
ALT: 76 U/L — ABNORMAL HIGH (ref 0–53)
AST: 53 U/L — ABNORMAL HIGH (ref 0–37)
Albumin: 4.6 g/dL (ref 3.5–5.2)
Alkaline Phosphatase: 90 U/L (ref 39–117)
BUN: 20 mg/dL (ref 6–23)
CO2: 28 mEq/L (ref 19–32)
Calcium: 9.4 mg/dL (ref 8.4–10.5)
Chloride: 100 mEq/L (ref 96–112)
Creatinine, Ser: 1.23 mg/dL (ref 0.40–1.50)
GFR: 60.12 mL/min (ref >60.00)
Glucose, Bld: 153 mg/dL — ABNORMAL HIGH (ref 70–99)
Potassium: 4.6 mEq/L (ref 3.5–5.1)
Sodium: 136 mEq/L (ref 135–145)
Total Bilirubin: 0.7 mg/dL (ref 0.2–1.2)
Total Protein: 7.8 g/dL (ref 6.0–8.3)

## 2021-12-31 LAB — PSA: PSA: 0.88 ng/mL (ref 0.10–4.00)

## 2021-12-31 LAB — TESTOSTERONE: Testosterone: 386 ng/dL (ref 300.00–890.00)

## 2021-12-31 LAB — TSH: TSH: 0.99 u[IU]/mL (ref 0.35–5.50)

## 2021-12-31 LAB — HEMOGLOBIN A1C: Hgb A1c MFr Bld: 7.9 % — ABNORMAL HIGH (ref 4.6–6.5)

## 2021-12-31 MED ORDER — TADALAFIL 5 MG PO TABS: 5.0000 mg | ORAL_TABLET | Freq: Every day | ORAL | 3 refills | Status: DC | PRN

## 2021-12-31 MED ORDER — PANTOPRAZOLE SODIUM 40 MG PO TBEC: 40.0000 mg | DELAYED_RELEASE_TABLET | Freq: Every day | ORAL | 3 refills | Status: DC

## 2021-12-31 MED ORDER — EZETIMIBE 10 MG PO TABS: 10.0000 mg | ORAL_TABLET | Freq: Every day | ORAL | 3 refills | Status: DC

## 2021-12-31 NOTE — Assessment & Plan Note (Addendum)
We discussed age appropriate health related issues, including available/recomended screening tests and vaccinations. Labs were ordered to be later reviewed . All questions were answered. We discussed one or more of the following - seat belt use, use of sunscreen/sun exposure exercise, safe sex, fall risk reduction, second hand smoke exposure, firearm use and storage, seat belt use, a need for adhering to healthy diet and exercise. Labs were ordered . All questions were answered.  Colon in 01/2019 w/Dr Fuller Plan. Due in 2030

## 2021-12-31 NOTE — Progress Notes (Signed)
Subjective:  Patient ID: Corey [name], male    DOB: 10-11-1950  Age: 71 y.o. MRN: [id number]  CC: Annual Exam   HPI Corey [name] presents for a well exam  Outpatient Medications Prior to Visit  Medication Sig Dispense Refill   Ascorbic Acid (VITAMIN C) 1000 MG tablet Take 1,000 mg by mouth 3 (three) times daily.     aspirin EC 81 MG EC tablet Take 1 tablet (81 mg total) by mouth daily.     cetirizine (ZYRTEC) 10 MG tablet Take 10 mg by mouth 2 (two) times daily. Takes twice daily     cholecalciferol (VITAMIN D3) 25 MCG (1000 UT) tablet Take 5,000 Units by mouth daily.     hydrOXYzine (ATARAX/VISTARIL) 25 MG tablet Take 1 tablet (25 mg total) by mouth every 8 (eight) hours as needed for itching. 180 tablet 0   Magnesium 400 MG CAPS Take by mouth 3 (three) times daily.     valsartan (DIOVAN) 80 MG tablet Take 1 tablet (80 mg total) by mouth daily. 90 tablet 3   Zinc 22.5 MG TABS Take by mouth 2 (two) times daily.     ezetimibe (ZETIA) 10 MG tablet Take 1 tablet (10 mg total) by mouth daily. Please schedule appointment for future refills. Thank you 90 tablet 2   pantoprazole (PROTONIX) 40 MG tablet Take 1 tablet (40 mg total) by mouth daily. 90 tablet 3   tadalafil (CIALIS) 5 MG tablet Take 1 tablet (5 mg total) by mouth daily as needed for erectile dysfunction. Overdue for Annual appt must see provider for future refills 90 tablet 0   Bempedoic Acid (NEXLETOL) 180 MG TABS Take 180 mg by mouth daily. 7 tablet 0   No facility-administered medications prior to visit.    ROS: Review of Systems  Constitutional:  Negative for appetite change, fatigue and unexpected weight change.  HENT:  Negative for congestion, nosebleeds, sneezing, sore throat and trouble swallowing.   Eyes:  Negative for itching and visual disturbance.  Respiratory:  Negative for cough.   Cardiovascular:  Negative for chest pain, palpitations and leg swelling.  Gastrointestinal:  Negative for abdominal  distention, blood in stool, diarrhea and nausea.  Genitourinary:  Negative for frequency and hematuria.  Musculoskeletal:  Negative for back pain, gait problem, joint swelling and neck pain.  Skin:  Negative for rash.  Neurological:  Negative for dizziness, tremors, speech difficulty and weakness.  Psychiatric/Behavioral:  Negative for agitation, dysphoric mood and sleep disturbance. The patient is not nervous/anxious.     Objective:  BP 122/72 (BP Location: Left Arm)   Pulse 88   Temp 98.3 F (36.8 C) (Oral)   Ht 6' 1.5" (1.867 m)   Wt 222 lb (100.7 kg)   SpO2 98%   BMI 28.89 kg/m   BP Readings from Last 3 Encounters:  12/31/21 122/72  09/17/21 124/80  01/03/21 132/70    Wt Readings from Last 3 Encounters:  12/31/21 222 lb (100.7 kg)  09/17/21 219 lb 4.8 oz (99.5 kg)  01/03/21 215 lb (97.5 kg)    Physical Exam Constitutional:      General: He is not in acute distress.    Appearance: He is well-developed.     Comments: NAD  Eyes:     Conjunctiva/sclera: Conjunctivae normal.     Pupils: Pupils are equal, round, and reactive to light.  Neck:     Thyroid: No thyromegaly.     Vascular: No JVD.  Cardiovascular:  Rate and Rhythm: Normal rate and regular rhythm.     Heart sounds: Normal heart sounds. No murmur heard.    No friction rub. No gallop.  Pulmonary:     Effort: Pulmonary effort is normal. No respiratory distress.     Breath sounds: Normal breath sounds. No wheezing or rales.  Chest:     Chest wall: No tenderness.  Abdominal:     General: Bowel sounds are normal. There is no distension.     Palpations: Abdomen is soft. There is no mass.     Tenderness: There is no abdominal tenderness. There is no guarding or rebound.  Musculoskeletal:        General: No tenderness. Normal range of motion.     Cervical back: Normal range of motion.  Lymphadenopathy:     Cervical: No cervical adenopathy.  Skin:    General: Skin is warm and dry.     Findings: No rash.   Neurological:     Mental Status: He is alert and oriented to person, place, and time.     Cranial Nerves: No cranial nerve deficit.     Motor: No abnormal muscle tone.     Coordination: Coordination normal.     Gait: Gait normal.     Deep Tendon Reflexes: Reflexes are normal and symmetric.  Psychiatric:        Behavior: Behavior normal.        Thought Content: Thought content normal.        Judgment: Judgment normal.   Rectal - per Urology  Lab Results  Component Value Date   WBC 10.1 07/10/2019   HGB 9.9 (L) 07/10/2019   HCT 30.2 (L) 07/10/2019   PLT 152 07/10/2019   GLUCOSE 137 (H) 11/06/2020   CHOL 177 11/06/2020   TRIG 206.0 (H) 11/06/2020   HDL 48.90 11/06/2020   LDLDIRECT 119.0 11/06/2020   LDLCALC 142 (H) 09/09/2019   ALT 51 (H) 07/06/2019   AST 75 (H) 07/06/2019   NA 136 11/06/2020   K 4.3 11/06/2020   CL 100 11/06/2020   CREATININE 1.41 11/06/2020   BUN 20 11/06/2020   CO2 28 11/06/2020   TSH 1.672 07/06/2019   PSA 0.97 05/16/2019   INR 1.2 07/07/2019   HGBA1C 6.6 (H) 11/06/2020   MICROALBUR <0.7 11/06/2020    DG Chest 2 View  Result Date: 08/08/2019 CLINICAL DATA:  Follow-up of CABG on 07/07/2019.  Pleural effusions. EXAM: CHEST - 2 VIEW COMPARISON:  07/10/2019 FINDINGS: The heart size and mediastinal contours are within normal limits. CABG. Both lungs are clear. The visualized skeletal structures are unremarkable. Complete resolution of the bilateral pleural effusions. No pneumothorax. IMPRESSION: No active cardiopulmonary disease. Resolution of small bilateral pleural effusions. Electronically Signed   By: Lorriane Shire M.D.   On: 08/08/2019 12:38    Assessment & Plan:   Problem List Items Addressed This Visit     Type 2 diabetes mellitus with hyperglycemia, without long-term current use of insulin (HCC)   Relevant Orders   Microalbumin / creatinine urine ratio   Hemoglobin A1c   Testosterone   Coronary atherosclerosis   Relevant Medications    ezetimibe (ZETIA) 10 MG tablet   tadalafil (CIALIS) 5 MG tablet   Hypercholesteremia   Relevant Medications   ezetimibe (ZETIA) 10 MG tablet   tadalafil (CIALIS) 5 MG tablet   Well adult exam - Primary    We discussed age appropriate health related issues, including available/recomended screening tests and vaccinations. Labs were  ordered to be later reviewed . All questions were answered. We discussed one or more of the following - seat belt use, use of sunscreen/sun exposure exercise, safe sex, fall risk reduction, second hand smoke exposure, firearm use and storage, seat belt use, a need for adhering to healthy diet and exercise. Labs were ordered . All questions were answered.  Colon in 01/2019 w/Dr Fuller Plan. Due in 2030      Relevant Medications   pantoprazole (PROTONIX) 40 MG tablet   Other Relevant Orders   TSH   Urinalysis   CBC with Differential/Platelet   Lipid panel   PSA   Comprehensive metabolic panel   Microalbumin / creatinine urine ratio   Hemoglobin A1c   Testosterone      Meds ordered this encounter  Medications   ezetimibe (ZETIA) 10 MG tablet    Sig: Take 1 tablet (10 mg total) by mouth daily. Please schedule appointment for future refills. Thank you    Dispense:  90 tablet    Refill:  3   tadalafil (CIALIS) 5 MG tablet    Sig: Take 1 tablet (5 mg total) by mouth daily as needed for erectile dysfunction. Overdue for Annual appt must see provider for future refills    Dispense:  90 tablet    Refill:  3   pantoprazole (PROTONIX) 40 MG tablet    Sig: Take 1 tablet (40 mg total) by mouth daily.    Dispense:  90 tablet    Refill:  3      Follow-up: Return in about 6 months (around 07/01/2022) for a follow-up visit.  Walker Kehr, MD

## 2022-01-02 ENCOUNTER — Other Ambulatory Visit: Payer: Self-pay | Admitting: Internal Medicine

## 2022-01-02 DIAGNOSIS — E1165 Type 2 diabetes mellitus with hyperglycemia: Secondary | ICD-10-CM

## 2022-03-27 ENCOUNTER — Encounter: Payer: Self-pay | Admitting: Internal Medicine

## 2022-03-27 ENCOUNTER — Ambulatory Visit: Payer: Managed Care, Other (non HMO) | Admitting: Internal Medicine

## 2022-03-27 VITALS — Ht 73.5 in | Wt 218.0 lb

## 2022-03-27 DIAGNOSIS — E1165 Type 2 diabetes mellitus with hyperglycemia: Secondary | ICD-10-CM | POA: Diagnosis not present

## 2022-03-27 LAB — POCT GLYCOSYLATED HEMOGLOBIN (HGB A1C): Hemoglobin A1C: 7.5 % — AB (ref 4.0–5.6)

## 2022-03-27 MED ORDER — INSULIN PEN NEEDLE 32G X 4 MM MISC: 1.0000 | Freq: Three times a day (TID) | 3 refills | Status: DC

## 2022-03-27 MED ORDER — ONETOUCH VERIO VI STRP: 1.0000 | ORAL_STRIP | Freq: Four times a day (QID) | 3 refills | Status: AC

## 2022-03-27 MED ORDER — ONETOUCH VERIO W/DEVICE KIT: 1.0000 | PACK | Freq: Every day | 0 refills | Status: AC

## 2022-03-27 MED ORDER — INSULIN LISPRO (1 UNIT DIAL) 100 UNIT/ML (KWIKPEN): PEN_INJECTOR | SUBCUTANEOUS | 3 refills | Status: DC

## 2022-03-27 NOTE — Progress Notes (Signed)
Name: Corey [name]  Age/ Sex: 72 y.o., male   MRN/ DOB: [id number], October 05, 1950     PCP: Cassandria Anger, MD   Reason for Endocrinology Evaluation: Type 2 Diabetes Mellitus  Initial Endocrine Consultative Visit: 07/13/2018    PATIENT IDENTIFIER: Corey [name] is a 72 y.o. male with a past medical history of HTN , Chronic Urticaria , T2DM, CAD and Hx of nephrolithiasis . The patient has followed with Endocrinology clinic since 07/13/2018 for consultative assistance with management of his diabetes.  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 He was started on an MDI regimen. Pt is not a big fan of western medicine.    Pt has a diagnosis of chronic urticaria for years, his last attack of this was in January, 2020 and having to go on prednisone 80 mg daily with injections as well, that he attributes his newly diagnosed DM to   Prandial insulin stopped 08/2018, pt was offered Metformin but he opted to continue with basal insulin at the time.  Basal insulin stopped 12/2018   Intolerant to Metformin due to chest pains - 2023  SUBJECTIVE:   During the last visit (11/15/2020): A1c 5.8 %. Pt remained off medications     Today (03/27/2022): Corey [name] is here for a  follow up on his diabetes management.He has NOT been to our clinic in 16 months.   He checks his blood sugars 1 times daily. The patient has not had hypoglycemic episodes since the last clinic visit.   He had recent check up with PCP and noted an A1c 7.9%   Denies nausea, vomiting or diarrhea  Has tried Metformin four times a day but developed chest pain  Has been very stressed lately    HOME DIABETES REGIMEN:  N/A     GLUCOSE LOG: 135-207 mg/dL     DIABETIC COMPLICATIONS: Microvascular complications:    Denies: retinopathy, neuropathy, CKD Last eye exam:  Completed 2023   Macrovascular complications:   CAD (S/P CABG)  Denies:  PVD, CVA      HISTORY:  Past Medical History:  Past Medical History:  Diagnosis Date   Allergic rhinitis    Allergy    Arthritis    Asthma    Barrett's esophagus    Cataract    removed both eyes with lens replacement Dr Katy Fitch   Complication of anesthesia    "took them 6 hours to wake me up"   Diabetes mellitus without complication (HCC)    GERD (gastroesophageal reflux disease)    Heart murmur    History of kidney stones    HTN (hypertension)    Hyperlipidemia    Pneumonia    as a kid   Past Surgical History:  Past Surgical History:  Procedure Laterality Date   CATARACT EXTRACTION, BILATERAL     with lens implants    COLONOSCOPY  01/15/2009   normal    CORONARY ARTERY BYPASS GRAFT N/A 07/07/2019   Procedure: CORONARY ARTERY BYPASS GRAFTING (CABG) using LIMA to LAD; Free IMA to RCA.;  Surgeon: Rexene Alberts, MD;  Location: [facility];  Service: Open Heart Surgery;  Laterality: N/A;  BILATERAL IMA   ENDOVEIN HARVEST OF GREATER SAPHENOUS VEIN Right 07/07/2019   Procedure: [place] Of Greater Saphenous Vein;  Surgeon: Rexene Alberts, MD;  Location: [facility];  Service: Open  Heart Surgery;  Laterality: Right;   ESOPHAGOGASTRODUODENOSCOPY  03/20/2006   EYE SURGERY     KIDNEY STONE SURGERY     x 4 - most recent 04-2018 at [facility]    LEFT HEART CATH AND CORONARY ANGIOGRAPHY N/A 07/05/2019   Procedure: LEFT HEART CATH AND CORONARY ANGIOGRAPHY;  Surgeon: [name], Peter M, MD;  Location: [facility];  Service: Cardiovascular;  Laterality: N/A;   TEE WITHOUT CARDIOVERSION N/A 07/07/2019   Procedure: TRANSESOPHAGEAL ECHOCARDIOGRAM (TEE);  Surgeon: Rexene Alberts, MD;  Location: [facility];  Service: Open Heart Surgery;  Laterality: N/A;   TONSILLECTOMY     TOTAL HIP ARTHROPLASTY Right 04/30/2016   Procedure: RIGHT TOTAL HIP ARTHROPLASTY ANTERIOR APPROACH;  Surgeon: Gaynelle Arabian, MD;  Location:  [facility];  Service: Orthopedics;  Laterality: Right;  requests 131mns   TRANSTHORACIC ECHOCARDIOGRAM  01/05/1998   UPPER GASTROINTESTINAL ENDOSCOPY     Social History:  reports that he has never smoked. He has never used smokeless tobacco. He reports that he does not drink alcohol and does not use drugs. Family History:  Family History  Problem Relation Age of Onset   Colon polyps Mother    Heart disease Mother    Diabetes Mother    Heart disease Father    Hypertension Other    Colon cancer Neg Hx    Esophageal cancer Neg Hx    Rectal cancer Neg Hx    Stomach cancer Neg Hx      HOME MEDICATIONS: Allergies as of 03/27/2022       Reactions   Nexletol [bempedoic Acid] Other (See Comments)   Clots in urine per pt   Albuterol    REACTION: "feels like drowning"   Aspirin Other (See Comments)   Causes bruising   Ciprofloxacin    REACTION: rash   Clarithromycin    REACTION: swelling   Colesevelam    REACTION: constip   Enalapril Maleate    REACTION: Erectile dysfunction   Esomeprazole Magnesium    REACTION: epigastric pain   Metformin And Related    CP   Niacin    REACTION: diarrhea   Pravastatin Sodium    REACTION: aching, listless   Rosuvastatin    REACTION: achy   Penicillins Hives   Has patient had a PCN reaction causing immediate rash, facial/tongue/throat swelling, SOB or lightheadedness with hypotension: Yes Has patient had a PCN reaction causing severe rash involving mucus membranes or skin necrosis: Yes Has patient had a PCN reaction that required hospitalization No Has patient had a PCN reaction occurring within the last 10 years: No If all of the above answers are "NO", then may proceed with Cephalosporin use.        Medication List        Accurate as of March 27, 2022 11:03 AM. If you have any questions, ask your nurse or doctor.          aspirin EC 81 MG tablet Take 1 tablet (81 mg total) by mouth daily.   cetirizine 10 MG tablet Commonly  known as: ZYRTEC Take 10 mg by mouth 2 (two) times daily. Takes twice daily   cholecalciferol 25 MCG (1000 UNIT) tablet Commonly known as: VITAMIN D3 Take 5,000 Units by mouth daily.   ezetimibe 10 MG tablet Commonly known as: ZETIA Take 1 tablet (10 mg total) by mouth daily. Please schedule appointment for future refills. Thank you   hydrOXYzine 25 MG tablet Commonly known as: ATARAX Take 1 tablet (  25 mg total) by mouth every 8 (eight) hours as needed for itching.   insulin lispro 100 UNIT/ML KwikPen Commonly known as: HumaLOG KwikPen Max daily 15 units Started by: Dorita Sciara, MD   Insulin Pen Needle 32G X 4 MM Misc 1 Device by Does not apply route 3 (three) times daily. Started by: Dorita Sciara, MD   Magnesium 400 MG Caps Take by mouth 3 (three) times daily.   OneTouch Verio test strip Generic drug: glucose blood 1 each by Other route in the morning, at noon, in the evening, and at bedtime. Use as instructed Started by: Dorita Sciara, MD   OneTouch Verio w/Device Kit 1 Device by Does not apply route daily in the afternoon. Started by: Dorita Sciara, MD   pantoprazole 40 MG tablet Commonly known as: PROTONIX Take 1 tablet (40 mg total) by mouth daily.   tadalafil 5 MG tablet Commonly known as: CIALIS Take 1 tablet (5 mg total) by mouth daily as needed for erectile dysfunction. Overdue for Annual appt must see provider for future refills   valsartan 80 MG tablet Commonly known as: DIOVAN Take 1 tablet (80 mg total) by mouth daily.   vitamin C 1000 MG tablet Take 1,000 mg by mouth 3 (three) times daily.   Zinc 22.5 MG Tabs Take by mouth 2 (two) times daily.        PHYSICAL EXAM: VS: Ht 6' 1.5" (1.867 m)   Wt 218 lb (98.9 kg)   BMI 28.37 kg/m   EXAM: General: Pt appears well and is in NAD  Lungs: Clear with good BS bilat with no rales, rhonchi, or wheezes  Heart: Auscultation: RRR   Extremities:  BL LE: no pretibial  edema   Mental Status: Judgment, insight: intact Orientation: oriented to time, place, and person Mood and affect: no depression, anxiety, or agitation   DM Foot Exam 11/06/2020 The skin of the feet is intact without sores or ulcerations. Right subungual hemtoma The pedal pulses are 2+ on right and 2+ on left. The sensation is intact to a screening 5.07, 10 gram monofilament bilaterally   DATA REVIEWED:   Latest Reference Range & Units 12/31/21 08:25  Sodium 135 - 145 mEq/L 136  Potassium 3.5 - 5.1 mEq/L 4.6  Chloride 96 - 112 mEq/L 100  CO2 19 - 32 mEq/L 28  Glucose 70 - 99 mg/dL 153 (H)  BUN 6 - 23 mg/dL 20  Creatinine 0.40 - 1.50 mg/dL 1.23  Calcium 8.4 - 10.5 mg/dL 9.4  Alkaline Phosphatase 39 - 117 U/L 90  Albumin 3.5 - 5.2 g/dL 4.6  AST 0 - 37 U/L 53 (H)  ALT 0 - 53 U/L 76 (H)  Total Protein 6.0 - 8.3 g/dL 7.8  Total Bilirubin 0.2 - 1.2 mg/dL 0.7  GFR >60.00 mL/min 60.12    Latest Reference Range & Units 12/31/21 08:25  Total CHOL/HDL Ratio  4  Cholesterol 0 - 200 mg/dL 195  HDL Cholesterol >39.00 mg/dL 50.00  LDL (calc) 0 - 99 mg/dL 111 (H)  MICROALB/CREAT RATIO 0.0 - 30.0 mg/g 1.3  NonHDL  144.54  Triglycerides 0.0 - 149.0 mg/dL 166.0 (H)  VLDL 0.0 - 40.0 mg/dL 33.2    Latest Reference Range & Units 12/31/21 08:25  Glucose 70 - 99 mg/dL 153 (H)  Hemoglobin A1C 4.6 - 6.5 % 7.9 (H)  Testosterone 300.00 - 890.00 ng/dL 386.00  TSH 0.35 - 5.50 uIU/mL 0.99    ASSESSMENT / PLAN / RECOMMENDATIONS:  1) Type 2 Diabetes Mellitus,Sub- Optimally Controlled , With macrovascular  complications - Most recent A1c of 7.9 %. Goal A1c < 7.0 %.     - Pt with hyperglycemia that he attributes to stress - He continues with diet and exercise  - Intolerant to Metformin  -I did explain to the patient that there are multiple glycemic agents that we could try, he would like to restart prandial insulin and avoid basal insulin or other glycemic agents , as he has done well with the  Humalog without any side effects in the past -I offered CGM technology but he declines at this time  - Onetouch verio meter and strips sent to pharmacy   MEDICATIONS: Start Humalog ( BG-120/30) TIDQAC   EDUCATION / INSTRUCTIONS: BG monitoring instructions: Patient is instructed to check his blood sugars 3 times a week.      F/U in 6 months    Signed electronically by: Mack Guise, MD  [facility]  [facility] [address] [place], [place] [address] Phone: [phone] FAX: [phone]   CC: Cassandria Anger, MD [place] [address] Phone: [phone]  Fax: [phone]  Return to Endocrinology clinic as below: Future Appointments  Date Time Provider [place]  09/30/2022  7:30 AM Mearle Drew, Melanie Crazier, MD [id number] None

## 2022-03-27 NOTE — Patient Instructions (Signed)
Humalog correctional insulin: Use the scale below to help guide you before each meal   Blood sugar before meal Number of units to inject  Less than 150 0 unit  151 -  180 1 units  181 -  210 2 units  211 -  240 3 units  241 -  270 4 units  271 -  300 5 units     HOW TO TREAT LOW BLOOD SUGARS (Blood sugar LESS THAN 70 MG/DL) Please follow the RULE OF 15 for the treatment of hypoglycemia treatment (when your (blood sugars are less than 70 mg/dL)   STEP 1: Take 15 grams of carbohydrates when your blood sugar is low, which includes:  3-4 GLUCOSE TABS  OR 3-4 OZ OF JUICE OR REGULAR SODA OR ONE TUBE OF GLUCOSE GEL    STEP 2: RECHECK blood sugar in 15 MINUTES STEP 3: If your blood sugar is still low at the 15 minute recheck --> then, go back to STEP 1 and treat AGAIN with another 15 grams of carbohydrates.

## 2022-04-08 ENCOUNTER — Other Ambulatory Visit: Payer: Self-pay

## 2022-04-08 DIAGNOSIS — N401 Enlarged prostate with lower urinary tract symptoms: Secondary | ICD-10-CM

## 2022-04-08 MED ORDER — TADALAFIL 5 MG PO TABS: 5.0000 mg | ORAL_TABLET | Freq: Every day | ORAL | 3 refills | Status: AC | PRN

## 2022-07-08 ENCOUNTER — Other Ambulatory Visit: Payer: Self-pay

## 2022-07-08 MED ORDER — FREESTYLE LIBRE 3 SENSOR MISC: 2 refills | Status: DC

## 2022-09-08 ENCOUNTER — Telehealth: Payer: Self-pay | Admitting: Internal Medicine

## 2022-09-08 NOTE — Telephone Encounter (Signed)
Patient has been advised and will make medication changes

## 2022-09-08 NOTE — Telephone Encounter (Signed)
Patient advising that his BGL are not registering well, he is advising that his levels are not staying regular. They are spiking and then dropping. Please advise patient is concerned about affecting A1C levels

## 2022-09-08 NOTE — Telephone Encounter (Signed)
Patient doesn't feel that his sugars are controlled on the Huamlog. He takes about 9 units max daily when needed. His sugars have been ranging in the 110-140 range fasting and about 200-240 range in the afternoon. Patient is on the [place] and report can be viewed.

## 2022-09-19 ENCOUNTER — Other Ambulatory Visit: Payer: Self-pay | Admitting: Internal Medicine

## 2022-09-22 ENCOUNTER — Telehealth: Payer: Self-pay | Admitting: Cardiology

## 2022-09-22 NOTE — Telephone Encounter (Signed)
Patient is returning call. Transferred to Kaila, RN.  

## 2022-09-22 NOTE — Telephone Encounter (Signed)
Left message for patient to call back  

## 2022-09-22 NOTE — Telephone Encounter (Signed)
Patient c/o Palpitations:  High priority if patient c/o lightheadedness, shortness of breath, or chest pain  How long have you had palpitations/irregular HR/ Afib? Are you having the symptoms now? Yes, for the past two hours  Are you currently experiencing lightheadedness, SOB or CP? No  Do you have a history of afib (atrial fibrillation) or irregular heart rhythm? Yes  Have you checked your BP or HR? (document readings if available): Hr is 80-91, while resting.   Are you experiencing any other symptoms? Patient stated that their heart is skipping beat.   Patient stated this started about 2 hours ago. Patient stated it came on all of a sudden and has continued since.Patient stated his heart started feeling like it was skipping a beat and started to feel like it was fluttering. Patient stated that he took his BP medicine this morning and that he is not having cp, sob, or lightheadedness. Please advise.

## 2022-09-22 NOTE — Telephone Encounter (Signed)
Patient returned call to office, transferred from call center.   Patient states he has been having palpitations, almost like it is skipping a beat. He states it almost feels like it takes his breath for the moment. He does note some tightness in his chest, and a slight pain in his right shoulder. He states this has been going on about for about 3-4 hours. He states things are getting a little better. He did an EKG on his phone and they looked funny but did not give an interpretation. HR while on the phone 98bpm. Hasn't felt this way since before open heart surgery.   Consulted with Dr. Cristal Deer, have patient monitor for the rest of the day and call tomorrow if not better to be brought in for EKG.

## 2022-09-23 ENCOUNTER — Encounter: Payer: Self-pay | Admitting: Cardiology

## 2022-09-23 NOTE — Telephone Encounter (Signed)
This encounter was created in error - please disregard.

## 2022-09-23 NOTE — Telephone Encounter (Signed)
Patient called back with update He is feeling better, found out yesterday his blood sugar was too low and thinks this was contributing  Advised to call back if any further concerns before follow up

## 2022-09-23 NOTE — Telephone Encounter (Signed)
Patient is returning call.  °

## 2022-09-29 NOTE — Progress Notes (Unsigned)
Name: Corey [name]  Age/ Sex: 72 y.o., male   MRN/ DOB: [id number], [birth year]     PCP: Tresa Garter, MD   Reason for Endocrinology Evaluation: Type 2 Diabetes Mellitus  Initial Endocrine Consultative Visit: 07/13/2018    PATIENT IDENTIFIER: Corey [name] is a 72 y.o. male with a past medical history of HTN , Chronic Urticaria , T2DM, CAD and Hx of nephrolithiasis . The patient has followed with Endocrinology clinic since 07/13/2018 for consultative assistance with management of his diabetes.  DIABETIC HISTORY:  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 He was started on an MDI regimen. Pt is not a big fan of western medicine.    Pt has a diagnosis of chronic urticaria for years, his last attack of this was in January, 2020 and having to go on prednisone 80 mg daily with injections as well, that he attributes his newly diagnosed DM to   Prandial insulin stopped 08/2018 Basal insulin stopped 12/2018   Intolerant to Metformin due to chest pains - 2023  Restarted Humalog per correction scale with an A1c of 7.9% 03/2022  SUBJECTIVE:   During the last visit (03/27/2022): A1c 7.9 %.     Today (09/30/2022): [name] is here for a  follow up on his diabetes management.   He checks his blood sugars multiple times daily. The patient has not had hypoglycemic episodes since the last clinic visit.    He has chronic urticaria, and has noted worsening pruritus which is affecting his sleep, last dermatology follow-up was 2 years ago He has noted changes in his heartbeat, that he attributes to diabetes  HOME DIABETES REGIMEN:  Humalog (BG -120/30) TIDQAC    CONTINUOUS GLUCOSE MONITORING RECORD INTERPRETATION    Dates of Recording: 7/24-09/30/2022  Sensor description: freestyle   Results statistics:   CGM use % of time 96  Average and SD 184/20.3   Time in range      55  %  % Time Above 180 40  % Time above 250 40  % Time Below target 5   Glycemic patterns summary: Bg's optimal at night but increase during the day   Hyperglycemic episodes  postprandial   Hypoglycemic episodes occurred n/a  Overnight periods: optimal      DIABETIC COMPLICATIONS: Microvascular complications:    Denies: retinopathy, neuropathy, CKD Last eye exam: Completed 2023   Macrovascular complications:   CAD (S/P CABG)  Denies:  PVD, CVA      HISTORY:  Past Medical History:  Past Medical History:  Diagnosis Date   Allergic rhinitis    Allergy    Arthritis    Asthma    Barrett's esophagus    Cataract    removed both eyes with lens replacement Dr Dione Booze   Complication of anesthesia    "took them 6 hours to wake me up"   Diabetes mellitus without complication (HCC)    GERD (gastroesophageal reflux disease)    Heart murmur    History of kidney stones    HTN (hypertension)    Hyperlipidemia    Pneumonia    as a kid   Past Surgical History:  Past Surgical History:  Procedure Laterality Date   CATARACT EXTRACTION, BILATERAL     with lens implants    COLONOSCOPY  01/15/2009   normal    CORONARY ARTERY BYPASS GRAFT N/A  07/07/2019   Procedure: CORONARY ARTERY BYPASS GRAFTING (CABG) using LIMA to LAD; Free IMA to RCA.;  Surgeon: Purcell Nails, MD;  Location: [facility] OR;  Service: Open Heart Surgery;  Laterality: N/A;  BILATERAL IMA   ENDOVEIN HARVEST OF GREATER SAPHENOUS VEIN Right 07/07/2019   Procedure: Mack Guise Of Greater Saphenous Vein;  Surgeon: Purcell Nails, MD;  Location: [facility] OR;  Service: Open Heart Surgery;  Laterality: Right;   ESOPHAGOGASTRODUODENOSCOPY  03/20/2006   EYE SURGERY     KIDNEY STONE SURGERY     x 4 - most recent 04-2018 at [facility]    LEFT HEART CATH AND CORONARY ANGIOGRAPHY N/A 07/05/2019   Procedure: LEFT HEART CATH AND CORONARY ANGIOGRAPHY;  Surgeon: [name], Corey M, MD;  Location: [facility]  INVASIVE CV LAB;  Service: Cardiovascular;  Laterality: N/A;   TEE WITHOUT CARDIOVERSION N/A 07/07/2019   Procedure: TRANSESOPHAGEAL ECHOCARDIOGRAM (TEE);  Surgeon: Purcell Nails, MD;  Location: [facility] OR;  Service: Open Heart Surgery;  Laterality: N/A;   TONSILLECTOMY     TOTAL HIP ARTHROPLASTY Right 04/30/2016   Procedure: RIGHT TOTAL HIP ARTHROPLASTY ANTERIOR APPROACH;  Surgeon: Ollen Gross, MD;  Location: [facility] ORS;  Service: Orthopedics;  Laterality: Right;  requests   TRANSTHORACIC ECHOCARDIOGRAM  01/05/1998   UPPER GASTROINTESTINAL ENDOSCOPY     Social History:  reports that he has never smoked. He has never used smokeless tobacco. He reports that he does not drink alcohol and does not use drugs. Family History:  Family History  Problem Relation Age of Onset   Colon polyps Mother    Heart disease Mother    Diabetes Mother    Heart disease Father    Hypertension Other    Colon cancer Neg Hx    Esophageal cancer Neg Hx    Rectal cancer Neg Hx    Stomach cancer Neg Hx      HOME MEDICATIONS: Allergies as of 09/30/2022       Reactions   Nexletol [bempedoic Acid] Other (See Comments)   Clots in urine per pt   Albuterol    REACTION: "feels like drowning"   Aspirin Other (See Comments)   Causes bruising   Ciprofloxacin    REACTION: rash   Clarithromycin    REACTION: swelling   Colesevelam    REACTION: constip   Enalapril Maleate    REACTION: Erectile dysfunction   Esomeprazole Magnesium    REACTION: epigastric pain   Metformin And Related    CP   Niacin    REACTION: diarrhea   Pravastatin Sodium    REACTION: aching, listless   Rosuvastatin    REACTION: achy   Penicillins Hives   Has patient had a PCN reaction causing immediate rash, facial/tongue/throat swelling, SOB or lightheadedness with hypotension: Yes Has patient had a PCN reaction causing severe rash involving mucus membranes or skin necrosis: Yes Has patient had a PCN reaction that required  hospitalization No Has patient had a PCN reaction occurring within the last 10 years: No If all of the above answers are "NO", then may proceed with Cephalosporin use.        Medication List        Accurate as of September 30, 2022  7:38 AM. If you have any questions, ask your nurse or doctor.          aspirin EC 81 MG tablet Take 1 tablet (81 mg total) by mouth daily.   cetirizine 10 MG tablet Commonly known as: ZYRTEC  Take 10 mg by mouth 2 (two) times daily. Takes twice daily   cholecalciferol 25 MCG (1000 UNIT) tablet Commonly known as: VITAMIN D3 Take 5,000 Units by mouth daily.   ezetimibe 10 MG tablet Commonly known as: ZETIA Take 1 tablet (10 mg total) by mouth daily. Please schedule appointment for future refills. Thank you   FreeStyle Libre 3 Sensor Misc PLACE 1 SENSOR ON THE SKIN EVERY 14 DAYS. USE TO CHECK GLUCOSE CONTINUOUSLY   hydrOXYzine 25 MG tablet Commonly known as: ATARAX Take 1 tablet (25 mg total) by mouth every 8 (eight) hours as needed for itching.   insulin lispro 100 UNIT/ML KwikPen Commonly known as: HumaLOG KwikPen Max daily 15 units   Insulin Pen Needle 32G X 4 MM Misc 1 Device by Does not apply route 3 (three) times daily.   Magnesium 400 MG Caps Take by mouth 3 (three) times daily.   OneTouch Verio test strip Generic drug: glucose blood 1 each by Other route in the morning, at noon, in the evening, and at bedtime. Use as instructed   OneTouch Verio w/Device Kit 1 Device by Does not apply route daily in the afternoon.   pantoprazole 40 MG tablet Commonly known as: PROTONIX Take 1 tablet (40 mg total) by mouth daily.   tadalafil 5 MG tablet Commonly known as: CIALIS Take 1 tablet (5 mg total) by mouth daily as needed for erectile dysfunction. Overdue for Annual appt must see provider for future refills   valsartan 80 MG tablet Commonly known as: DIOVAN Take 1 tablet (80 mg total) by mouth daily.   vitamin C 1000 MG  tablet Take 1,000 mg by mouth 3 (three) times daily.   Zinc 22.5 MG Tabs Take by mouth 2 (two) times daily.        PHYSICAL EXAM: VS: BP 126/78 (BP Location: Left Arm, Patient Position: Sitting, Cuff Size: Small)   Pulse 100   Ht 6' 1.5" (1.867 m)   Wt 215 lb (97.5 kg)   SpO2 97%   BMI 27.98 kg/m   EXAM: General: Pt appears well and is in NAD  Lungs: Clear with good BS bilat with no rales, rhonchi, or wheezes  Heart: Auscultation: RRR   Extremities:  BL LE: no pretibial edema   Mental Status: Judgment, insight: intact Orientation: oriented to time, place, and person Mood and affect: no depression, anxiety, or agitation   DM Foot Exam 09/30/2022 The skin of the feet is intact without sores or ulcerations.  The pedal pulses are 2+ on right and 2+ on left. The sensation is intact to a screening 5.07, 10 gram monofilament bilaterally   DATA REVIEWED:   Latest Reference Range & Units 12/31/21 08:25  Sodium 135 - 145 mEq/L 136  Potassium 3.5 - 5.1 mEq/L 4.6  Chloride 96 - 112 mEq/L 100  CO2 19 - 32 mEq/L 28  Glucose 70 - 99 mg/dL 161 (H)  BUN 6 - 23 mg/dL 20  Creatinine 0.96 - 0.45 mg/dL 4.09  Calcium 8.4 - 81.1 mg/dL 9.4  Alkaline Phosphatase 39 - 117 U/L 90  Albumin 3.5 - 5.2 g/dL 4.6  AST 0 - 37 U/L 53 (H)  ALT 0 - 53 U/L 76 (H)  Total Protein 6.0 - 8.3 g/dL 7.8  Total Bilirubin 0.2 - 1.2 mg/dL 0.7  GFR >91.47 mL/min 60.12    Latest Reference Range & Units 12/31/21 08:25  Total CHOL/HDL Ratio  4  Cholesterol 0 - 200 mg/dL 829  HDL Cholesterol >  39.00 mg/dL 40.98  LDL (calc) 0 - 99 mg/dL 119 (H)  MICROALB/CREAT RATIO 0.0 - 30.0 mg/g 1.3  NonHDL  144.54  Triglycerides 0.0 - 149.0 mg/dL 147.8 (H)  VLDL 0.0 - 29.5 mg/dL 62.1    Latest Reference Range & Units 12/31/21 08:25  Glucose 70 - 99 mg/dL 308 (H)  Hemoglobin M5H 4.6 - 6.5 % 7.9 (H)  Testosterone 300.00 - 890.00 ng/dL 846.96  TSH 2.95 - 2.84 uIU/mL 0.99    ASSESSMENT / PLAN / RECOMMENDATIONS:  1)  Type 2 Diabetes Mellitus,Sub- Optimally Controlled , With macrovascular  complications - Most recent A1c of 7.9 %. Goal A1c < 7.0 %.      -His A1c continues to be above goal - Intolerant to Metformin  -We again discussed alternative treatments, treatments that would provide cardiovascular as well as renal benefits, patient in agreement to this at this time -We discussed SGLT2 inhibitors, will discuss benefits as well as the risk of genital infections, I have encouraged the patient to increase hydration -I will also change his sensitivity factor as below, he will continue to use it before each meal as needed  MEDICATIONS: Start Jardiance 10 mg daily Change Humalog ( BG-130/25) TIDQAC   EDUCATION / INSTRUCTIONS: BG monitoring instructions: Patient is instructed to check his blood sugars 3 times a week.   2) Urticaria:  -Patient has noted worsening symptoms, requesting refill on Atarax which was done today -Patient to schedule a follow-up appointment with dermatology   F/U in 3 months    Signed electronically by: Lyndle Herrlich, MD  [facility] Endocrinology  [facility] [address] [place] [place], [place] [address] Phone: [phone] FAX: [phone]   CC: Tresa Garter, MD [address] [place] [address] Phone: [phone]  Fax: [phone]  Return to Endocrinology clinic as below: No future appointments.

## 2022-09-30 ENCOUNTER — Encounter: Payer: Self-pay | Admitting: Internal Medicine

## 2022-09-30 ENCOUNTER — Ambulatory Visit: Payer: Managed Care, Other (non HMO) | Admitting: Internal Medicine

## 2022-09-30 VITALS — BP 126/78 | HR 100 | Ht 73.5 in | Wt 215.0 lb

## 2022-09-30 DIAGNOSIS — Z794 Long term (current) use of insulin: Secondary | ICD-10-CM | POA: Diagnosis not present

## 2022-09-30 DIAGNOSIS — Z7984 Long term (current) use of oral hypoglycemic drugs: Secondary | ICD-10-CM

## 2022-09-30 DIAGNOSIS — L509 Urticaria, unspecified: Secondary | ICD-10-CM

## 2022-09-30 DIAGNOSIS — E1165 Type 2 diabetes mellitus with hyperglycemia: Secondary | ICD-10-CM

## 2022-09-30 LAB — POCT GLYCOSYLATED HEMOGLOBIN (HGB A1C): Hemoglobin A1C: 7.9 % — AB (ref 4.0–5.6)

## 2022-09-30 MED ORDER — HYDROXYZINE HCL 25 MG PO TABS: 25.0000 mg | ORAL_TABLET | Freq: Three times a day (TID) | ORAL | 0 refills | Status: DC | PRN

## 2022-09-30 MED ORDER — EMPAGLIFLOZIN 10 MG PO TABS: 10.0000 mg | ORAL_TABLET | Freq: Every day | ORAL | 3 refills | Status: DC

## 2022-09-30 NOTE — Patient Instructions (Addendum)
Start Jardiance 10 mg , 1 tablet every morning  Humalog correctional insulin: Use the scale below to help guide you before each meal   Blood sugar before meal Number of units to inject  Less than 155 0 unit  156- 180 1 units  181 - 205 2 units  206 - 230 3 units  231 - 255 4 units  256 - 280 5 units  281 - 305 6 units     HOW TO TREAT LOW BLOOD SUGARS (Blood sugar LESS THAN 70 MG/DL) Please follow the RULE OF 15 for the treatment of hypoglycemia treatment (when your (blood sugars are less than 70 mg/dL)   STEP 1: Take 15 grams of carbohydrates when your blood sugar is low, which includes:  3-4 GLUCOSE TABS  OR 3-4 OZ OF JUICE OR REGULAR SODA OR ONE TUBE OF GLUCOSE GEL    STEP 2: RECHECK blood sugar in 15 MINUTES STEP 3: If your blood sugar is still low at the 15 minute recheck --> then, go back to STEP 1 and treat AGAIN with another 15 grams of carbohydrates.

## 2022-10-21 ENCOUNTER — Other Ambulatory Visit (HOSPITAL_BASED_OUTPATIENT_CLINIC_OR_DEPARTMENT_OTHER): Payer: Self-pay | Admitting: Cardiology

## 2022-10-21 DIAGNOSIS — I10 Essential (primary) hypertension: Secondary | ICD-10-CM

## 2022-11-04 ENCOUNTER — Telehealth (HOSPITAL_BASED_OUTPATIENT_CLINIC_OR_DEPARTMENT_OTHER): Payer: Self-pay | Admitting: Cardiology

## 2022-11-04 DIAGNOSIS — R002 Palpitations: Secondary | ICD-10-CM

## 2022-11-04 NOTE — Telephone Encounter (Signed)
2nd instance of palpitations, ok to order Zio XT and follow up in 6-8 weeks?

## 2022-11-04 NOTE — Telephone Encounter (Signed)
Patient c/o Palpitations:  STAT if patient reporting lightheadedness, shortness of breath, or chest pain  How long have you had palpitations/irregular HR/ Afib? Has had palpitations for about two weeks. Are you having the symptoms now? no  Are you currently experiencing lightheadedness, SOB or CP? No   Do you have a history of afib (atrial fibrillation) or irregular heart rhythm? no  Have you checked your BP or HR? (document readings if available): no, doesn't have anything to check BP or HR  with.  He did an EKG on his watch and that was normal.    Are you experiencing any other symptoms? No  Patient states sometimes he gets palpitations when his sugar is low, he states that is not the case now because he checked his sugar today.  He did have an episode earlier today of palpitations which has resolved.

## 2022-11-05 NOTE — Telephone Encounter (Signed)
Pt is calling to f/u on getting a callback to be advised on what to do. Please advise

## 2022-11-05 NOTE — Telephone Encounter (Signed)
Consulted with Dr. Cristal Deer,   Have patient wear 14 day zio XT & schedule 4-6 week follow up to go over results.   Monitor ordered, patient aware and agreeable, scheduled fir 4-6 week follow up.

## 2022-11-07 ENCOUNTER — Ambulatory Visit: Payer: Managed Care, Other (non HMO) | Attending: Cardiology

## 2022-11-07 DIAGNOSIS — R002 Palpitations: Secondary | ICD-10-CM

## 2022-11-07 NOTE — Progress Notes (Unsigned)
Enrolled patient for a 14 day Zio XT  monitor to be mailed to patients home  °

## 2022-11-10 DIAGNOSIS — R002 Palpitations: Secondary | ICD-10-CM | POA: Diagnosis not present

## 2022-11-21 ENCOUNTER — Other Ambulatory Visit: Payer: Self-pay | Admitting: Internal Medicine

## 2022-12-10 ENCOUNTER — Encounter (HOSPITAL_BASED_OUTPATIENT_CLINIC_OR_DEPARTMENT_OTHER): Payer: Self-pay | Admitting: Cardiology

## 2022-12-10 ENCOUNTER — Ambulatory Visit (HOSPITAL_BASED_OUTPATIENT_CLINIC_OR_DEPARTMENT_OTHER): Payer: Managed Care, Other (non HMO) | Admitting: Cardiology

## 2022-12-10 VITALS — BP 132/72 | HR 101 | Ht 73.0 in | Wt 212.0 lb

## 2022-12-10 DIAGNOSIS — E119 Type 2 diabetes mellitus without complications: Secondary | ICD-10-CM | POA: Diagnosis not present

## 2022-12-10 DIAGNOSIS — I10 Essential (primary) hypertension: Secondary | ICD-10-CM | POA: Diagnosis not present

## 2022-12-10 DIAGNOSIS — R002 Palpitations: Secondary | ICD-10-CM

## 2022-12-10 DIAGNOSIS — I2510 Atherosclerotic heart disease of native coronary artery without angina pectoris: Secondary | ICD-10-CM

## 2022-12-10 DIAGNOSIS — E7800 Pure hypercholesterolemia, unspecified: Secondary | ICD-10-CM

## 2022-12-10 DIAGNOSIS — Z789 Other specified health status: Secondary | ICD-10-CM

## 2022-12-10 NOTE — Patient Instructions (Signed)
Medication Instructions:  NO CHANGES *If you need a refill on your cardiac medications before your next appointment, please call your pharmacy*   Lab Work: NONE If you have labs (blood work) drawn today and your tests are completely normal, you will receive your results only by: MyChart Message (if you have MyChart) OR A paper copy in the mail If you have any lab test that is abnormal or we need to change your treatment, we will call you to review the results.   Testing/Procedures: NONE   Follow-Up: At [facility], you and your health needs are our priority.  As part of our continuing mission to provide you with exceptional heart care, we have created designated Provider Care Teams.  These Care Teams include your primary Cardiologist (physician) and Advanced Practice Providers (APPs -  Physician Assistants and Nurse Practitioners) who all work together to provide you with the care you need, when you need it.  We recommend signing up for the patient portal called "MyChart".  Sign up information is provided on this After Visit Summary.  MyChart is used to connect with patients for Virtual Visits (Telemedicine).  Patients are able to view lab/test results, encounter notes, upcoming appointments, etc.  Non-urgent messages can be sent to your provider as well.   To learn more about what you can do with MyChart, go to [email or web address].    Your next appointment:   1 year(s)  Provider:   DR Cristal Deer   Other Instructions NONE

## 2022-12-10 NOTE — Progress Notes (Signed)
Cardiology Office Note:  .   Date:  12/10/2022  ID:  Corey [name], DOB 20-Oct-1950, MRN [id number] PCP: Tresa Garter, MD  [facility] Providers Cardiologist:  Jodelle Red, MD {  History of Present Illness: Marland Kitchen   Corey [name] is a 72 y.o. male with a hx of CAD s/p CABG, hypertension, type II diabetes, dyslipidemia, recurrent kidney stones who is seen for follow up today. He was initially seen 06/16/19 as a new consult at the request of Plotnikov, Georgina Quint, MD for the evaluation and management of elevated calcium score.   Cardiac history: Initially seen 05/2019 for calcium score of 1275. Admitted for NSTEMI 06/2019, cath with severe 2V CAD (90% ostial LAD, 100% pRCA). S/P CABG 07/07/19 (Dr. Cornelius Moras, LIMA-LAD, Free RIMA-RCA). Risk factors include hypertension, hyperlipidemia, type II diabetes. Statin intolerant. Does tolerate ezetimibe. Concerned about PCSK9i injection due to his chronic hives.   Today: Had a serious rash with monitor adhesive, had to take prednisone, and then he got Covid when he was immunosuppressed.  We reviewed his monitor results today, which are reassuring. Most of his symptoms were NSR, some with PVC/ectopy.  Was recommended to start Jardiance, but his insurance instructed him not to fill due to kidney risks. Unclear what this is--does have a history of kidney stones, but we discussed that generally SGLT2i are kidney protective. He will discuss with his endocrinologist.  Due for labs soon with his PCP. Have struggled with his LDL (see below). Taking zetia, careful with lifestyle. Limited options for management, see below.  ROS: Denies chest pain, shortness of breath at rest or with normal exertion. No PND, orthopnea, LE edema or unexpected weight gain. No syncope or palpitations. ROS otherwise negative except as noted.   Studies Reviewed: Marland Kitchen    EKG:  EKG Interpretation Date/Time:  Wednesday December 10 2022 11:56:21 EDT Ventricular Rate:   101 PR Interval:  154 QRS Duration:  84 QT Interval:  310 QTC Calculation: 401 R Axis:   -27  Text Interpretation: Sinus tachycardia Nonspecific T wave abnormality Confirmed by Jodelle Red [phone]) on 12/10/2022 12:20:00 PM    Physical Exam:   VS:  BP 132/72 (BP Location: Left Arm, Patient Position: Sitting, Cuff Size: Normal)   Pulse (!) 101   Ht 6\' 1"  (1.854 m)   Wt 212 lb (96.2 kg)   SpO2 97%   BMI 27.97 kg/m    Wt Readings from Last 3 Encounters:  12/10/22 212 lb (96.2 kg)  09/30/22 215 lb (97.5 kg)  03/27/22 218 lb (98.9 kg)    GEN: Well nourished, well developed in no acute distress HEENT: Normal, moist mucous membranes NECK: No JVD CARDIAC: regular rhythm, normal S1 and S2, no rubs or gallops. No murmur. VASCULAR: Radial and DP pulses 2+ bilaterally. No carotid bruits RESPIRATORY:  Clear to auscultation without rales, wheezing or rhonchi  ABDOMEN: Soft, non-tender, non-distended MUSCULOSKELETAL:  Ambulates independently SKIN: Warm and dry, no edema NEUROLOGIC:  Alert and oriented x 3. No focal neuro deficits noted. PSYCHIATRIC:  Normal affect    ASSESSMENT AND PLAN: .    Palpitations -reviewed monitor together today. Symptoms do not correlate with arrhythmia. Suggests noncardiac etiology of his symptoms. He feels this may be due to his sugar  CAD, s/p CABG 06/2019, Dr. Cornelius Moras (LIMA to LAD, Free RIMA to RCA with Short Reversed Greater Saphenous Vein Interposition at Proximal Anastomosis) -doing very well, no chest pain -on aspirin 81 mg -completed 1 year of clopidogrel -now off  metoprolol succinate 25 mg daily (patient preference) -continue to struggle with lipid management, see below   Cardiomyopathy, likely ischemic: pre-CABG EF 45-50%, normalized on repeat echo -on ARB, no longer on metoprolol succinate as above -NYHA class 1   hypercholesterolemia, with LDL goal <55 given CAD and diabetes -has history of multiple statin intolerances, intolerance to  nexletol. Tolerates ezetimibe -we have discussed PCSK9i. He is very concerned about an injectable and being unable to "stop" the medical if he has a reaction. Declines for now, but with history of hives, would prefer to get first injection in clinic to be monitored if this is the only option in the future -due for labs with his PCP   Hypertension: goal <130/80 -just at goal today, continue valsartan   Type II diabetes, without obesity: -has not tolerated metformin, gave him chest pain -in the setting of CAD, discussed recommendation for SGLT2i and GLP1RA. He was prescribed SGLT2i by his endocrinologist but received a notification from his insurance resulting in him not electing to start it.  CV risk counseling and prevention -recommend heart healthy/Mediterranean diet, with whole grains, fruits, vegetable, fish, lean meats, nuts, and olive oil. Limit salt. -recommend moderate walking, 3-5 times/week for 30-50 minutes each session. Aim for at least 150 minutes.week. Goal should be pace of 3 miles/hours, or walking 1.5 miles in 30 minutes -recommend avoidance of tobacco products. Avoid excess alcohol.  Dispo: 1 year or sooner as needed  Signed, Jodelle Red, MD   Jodelle Red, MD, PhD, [facility]  [facility]  [place]  [facility] at [facility] [address] [place], [place] [address] [phone]

## 2022-12-22 ENCOUNTER — Other Ambulatory Visit: Payer: Self-pay | Admitting: Internal Medicine

## 2023-01-06 ENCOUNTER — Other Ambulatory Visit: Payer: Self-pay

## 2023-01-06 MED ORDER — INSULIN LISPRO (1 UNIT DIAL) 100 UNIT/ML (KWIKPEN): PEN_INJECTOR | SUBCUTANEOUS | 3 refills | Status: DC

## 2023-01-19 ENCOUNTER — Other Ambulatory Visit (HOSPITAL_BASED_OUTPATIENT_CLINIC_OR_DEPARTMENT_OTHER): Payer: Self-pay | Admitting: Cardiology

## 2023-01-19 DIAGNOSIS — I10 Essential (primary) hypertension: Secondary | ICD-10-CM

## 2023-03-10 ENCOUNTER — Ambulatory Visit: Payer: Managed Care, Other (non HMO) | Admitting: Internal Medicine

## 2023-03-20 ENCOUNTER — Other Ambulatory Visit: Payer: Self-pay | Admitting: Internal Medicine

## 2023-03-20 DIAGNOSIS — I2510 Atherosclerotic heart disease of native coronary artery without angina pectoris: Secondary | ICD-10-CM

## 2023-03-20 DIAGNOSIS — E7800 Pure hypercholesterolemia, unspecified: Secondary | ICD-10-CM

## 2023-05-11 ENCOUNTER — Encounter: Payer: Self-pay | Admitting: Internal Medicine

## 2023-05-11 ENCOUNTER — Ambulatory Visit: Payer: Managed Care, Other (non HMO) | Admitting: Internal Medicine

## 2023-05-11 VITALS — BP 134/80 | HR 93 | Ht 73.0 in | Wt 219.0 lb

## 2023-05-11 DIAGNOSIS — E1129 Type 2 diabetes mellitus with other diabetic kidney complication: Secondary | ICD-10-CM

## 2023-05-11 DIAGNOSIS — E785 Hyperlipidemia, unspecified: Secondary | ICD-10-CM

## 2023-05-11 DIAGNOSIS — Z794 Long term (current) use of insulin: Secondary | ICD-10-CM

## 2023-05-11 DIAGNOSIS — E1165 Type 2 diabetes mellitus with hyperglycemia: Secondary | ICD-10-CM | POA: Diagnosis not present

## 2023-05-11 DIAGNOSIS — R809 Proteinuria, unspecified: Secondary | ICD-10-CM

## 2023-05-11 LAB — POCT GLYCOSYLATED HEMOGLOBIN (HGB A1C): Hemoglobin A1C: 9 % — AB (ref 4.0–5.6)

## 2023-05-11 MED ORDER — FREESTYLE LIBRE 3 PLUS SENSOR MISC: 1.0000 | 3 refills | Status: DC

## 2023-05-11 MED ORDER — TRESIBA FLEXTOUCH 100 UNIT/ML ~~LOC~~ SOPN: 20.0000 [IU] | PEN_INJECTOR | Freq: Every day | SUBCUTANEOUS | 4 refills | Status: DC

## 2023-05-11 MED ORDER — INSULIN PEN NEEDLE 32G X 4 MM MISC: 1.0000 | Freq: Four times a day (QID) | 3 refills | Status: DC

## 2023-05-11 MED ORDER — INSULIN LISPRO (1 UNIT DIAL) 100 UNIT/ML (KWIKPEN): PEN_INJECTOR | SUBCUTANEOUS | 3 refills | Status: DC

## 2023-05-11 NOTE — Patient Instructions (Addendum)
 Start Tresiba 20 units daily  Humalog correctional insulin: Use the scale below to help guide you before each meal   Blood sugar before meal Number of units to inject  Less than 155 0 unit  156- 180 1 units  181 - 205 2 units  206 - 230 3 units  231 - 255 4 units  256 - 280 5 units  281 - 305 6 units     HOW TO TREAT LOW BLOOD SUGARS (Blood sugar LESS THAN 70 MG/DL) Please follow the RULE OF 15 for the treatment of hypoglycemia treatment (when your (blood sugars are less than 70 mg/dL)   STEP 1: Take 15 grams of carbohydrates when your blood sugar is low, which includes:  3-4 GLUCOSE TABS  OR 3-4 OZ OF JUICE OR REGULAR SODA OR ONE TUBE OF GLUCOSE GEL    STEP 2: RECHECK blood sugar in 15 MINUTES STEP 3: If your blood sugar is still low at the 15 minute recheck --> then, go back to STEP 1 and treat AGAIN with another 15 grams of carbohydrates.

## 2023-05-11 NOTE — Progress Notes (Unsigned)
 Name: Corey [name]  Age/ Sex: 73 y.o., male   MRN/ DOB: [id number], 06-07-1950     PCP: Tresa Garter, MD   Reason for Endocrinology Evaluation: Type 2 Diabetes Mellitus  Initial Endocrine Consultative Visit: 07/13/2018    PATIENT IDENTIFIER: Corey [name] is a 73 y.o. male with a past medical history of HTN , Chronic Urticaria , T2DM, CAD and Hx of nephrolithiasis . The patient has followed with Endocrinology clinic since 07/13/2018 for consultative assistance with management of his diabetes.  DIABETIC HISTORY:  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 He was started on an MDI regimen. Pt is not a big fan of western medicine.    Pt has a diagnosis of chronic urticaria for years, his last attack of this was in January, 2020 and having to go on prednisone 80 mg daily with injections as well, that he attributes his newly diagnosed DM to   Prandial insulin stopped 08/2018 Basal insulin stopped 12/2018   Intolerant to Metformin due to chest pains - 2023  Restarted Humalog per correction scale with an A1c of 7.9% 03/2022    SUBJECTIVE:   During the last visit (09/30/2022): A1c 7.9 %.     Today (05/11/2023): [name] is here for a  follow up on his diabetes management. He has NOT been to our clinic in 8 months.   He checks his blood sugars 3x daily. The patient has not had hypoglycemic episodes since the last clinic visit.    He has not been able to obtain the freestyle libre from the pharmacy He had a follow-up with cardiology for CAD and cardiomyopathy I have prescribed Jardiance but he was skeptical about side effects towards his kidney, cardiology assured him that SGLT2 inhibitors are kidney protective  Denies nausea or vomiting  Denies constipation or diarrhea    HOME DIABETES REGIMEN:  Jardiance 10 mg daily  Humalog (BG -120/30)  TIDQAC   GLUCOSE LOG: 125-274     DIABETIC COMPLICATIONS: Microvascular complications:    Denies: retinopathy, neuropathy, CKD Last eye exam: Completed 2023   Macrovascular complications:   CAD (S/P CABG)  Denies:  PVD, CVA      HISTORY:  Past Medical History:  Past Medical History:  Diagnosis Date   Allergic rhinitis    Allergy    Arthritis    Asthma    Barrett's esophagus    Cataract    removed both eyes with lens replacement Dr Dione Booze   Complication of anesthesia    "took them 6 hours to wake me up"   Diabetes mellitus without complication (HCC)    GERD (gastroesophageal reflux disease)    Heart murmur    History of kidney stones    HTN (hypertension)    Hyperlipidemia    Pneumonia    as a kid   Past Surgical History:  Past Surgical History:  Procedure Laterality Date   CATARACT EXTRACTION, BILATERAL     with lens implants    COLONOSCOPY  01/15/2009   normal    CORONARY ARTERY BYPASS GRAFT N/A 07/07/2019   Procedure: CORONARY ARTERY BYPASS GRAFTING (CABG) using LIMA to LAD; Free IMA to RCA.;  Surgeon: Purcell Nails, MD;  [facility];  Service: Open Heart Surgery;  Laterality: N/A;  BILATERAL IMA   ENDOVEIN HARVEST OF GREATER SAPHENOUS VEIN Right 07/07/2019   Procedure:  Mack Guise Of Greater Saphenous Vein;  Surgeon: Purcell Nails, MD;  Location: [facility] OR;  Service: Open Heart Surgery;  Laterality: Right;   ESOPHAGOGASTRODUODENOSCOPY  03/20/2006   EYE SURGERY     KIDNEY STONE SURGERY     x 4 - most recent 04-2018 at [facility]    LEFT HEART CATH AND CORONARY ANGIOGRAPHY N/A 07/05/2019   Procedure: LEFT HEART CATH AND CORONARY ANGIOGRAPHY;  Surgeon: [name], Peter M, MD;  Location: [facility] INVASIVE CV LAB;  Service: Cardiovascular;  Laterality: N/A;   TEE WITHOUT CARDIOVERSION N/A 07/07/2019   Procedure: TRANSESOPHAGEAL ECHOCARDIOGRAM (TEE);  Surgeon: Purcell Nails, MD;  Location: [facility] OR;  Service: Open Heart Surgery;  Laterality: N/A;    TONSILLECTOMY     TOTAL HIP ARTHROPLASTY Right 04/30/2016   Procedure: RIGHT TOTAL HIP ARTHROPLASTY ANTERIOR APPROACH;  Surgeon: Ollen Gross, MD;  Location: [facility] ORS;  Service: Orthopedics;  Laterality: Right;  requests   TRANSTHORACIC ECHOCARDIOGRAM  01/05/1998   UPPER GASTROINTESTINAL ENDOSCOPY     Social History:  reports that he has never smoked. He has never used smokeless tobacco. He reports that he does not drink alcohol and does not use drugs. Family History:  Family History  Problem Relation Age of Onset   Colon polyps Mother    Heart disease Mother    Diabetes Mother    Heart disease Father    Hypertension Other    Colon cancer Neg Hx    Esophageal cancer Neg Hx    Rectal cancer Neg Hx    Stomach cancer Neg Hx      HOME MEDICATIONS: Allergies as of 05/11/2023       Reactions   Nexletol [bempedoic Acid] Other (See Comments)   Clots in urine per pt   Albuterol    REACTION: "feels like drowning"   Aspirin Other (See Comments)   Causes bruising   Ciprofloxacin    REACTION: rash   Clarithromycin    REACTION: swelling   Colesevelam    REACTION: constip   Enalapril Maleate    REACTION: Erectile dysfunction   Esomeprazole Magnesium    REACTION: epigastric pain   Metformin And Related    CP   Niacin    REACTION: diarrhea   Pravastatin Sodium    REACTION: aching, listless   Rosuvastatin    REACTION: achy   Penicillins Hives   Has patient had a PCN reaction causing immediate rash, facial/tongue/throat swelling, SOB or lightheadedness with hypotension: Yes Has patient had a PCN reaction causing severe rash involving mucus membranes or skin necrosis: Yes Has patient had a PCN reaction that required hospitalization No Has patient had a PCN reaction occurring within the last 10 years: No If all of the above answers are "NO", then may proceed with Cephalosporin use.        Medication List        Accurate as of May 11, 2023  1:41 PM. If you have any  questions, ask your nurse or doctor.          STOP taking these medications    empagliflozin 10 MG Tabs tablet Commonly known as: Jardiance Stopped by: Johnney Ou Knoxx Boeding   FreeStyle Libre 3 Sensor Misc Stopped by: Johnney Ou Freeman Borba       TAKE these medications    aspirin EC 81 MG tablet Take 1 tablet (81 mg total) by mouth daily.   cetirizine 10 MG tablet Commonly known as: ZYRTEC Take 10 mg by mouth 2 (  two) times daily. Takes twice daily   cholecalciferol 25 MCG (1000 UNIT) tablet Commonly known as: VITAMIN D3 Take 5,000 Units by mouth daily.   ezetimibe 10 MG tablet Commonly known as: ZETIA Take 1 tablet (10 mg total) by mouth daily. Please schedule appointment for future refills. Thank you   fluticasone 0.05 % cream Commonly known as: CUTIVATE Apply topically.   hydrOXYzine 25 MG tablet Commonly known as: ATARAX Take 1 tablet (25 mg total) by mouth every 8 (eight) hours as needed for itching.   insulin lispro 100 UNIT/ML KwikPen Commonly known as: HumaLOG KwikPen Change Humalog ( BG-130/25) TIDQAC Max daily 15 units   Insulin Pen Needle 32G X 4 MM Misc 1 Device by Does not apply route 3 (three) times daily.   ketoconazole 2 % shampoo Commonly known as: NIZORAL Apply topically every other day.   Magnesium 400 MG Caps Take by mouth 3 (three) times daily.   OneTouch Verio test strip Generic drug: glucose blood 1 each by Other route in the morning, at noon, in the evening, and at bedtime. Use as instructed   OneTouch Verio w/Device Kit 1 Device by Does not apply route daily in the afternoon.   pantoprazole 40 MG tablet Commonly known as: PROTONIX Take 1 tablet (40 mg total) by mouth daily. What changed: how much to take   tadalafil 5 MG tablet Commonly known as: CIALIS Take 1 tablet (5 mg total) by mouth daily as needed for erectile dysfunction. Overdue for Annual appt must see provider for future refills   triamcinolone cream 0.1  % Commonly known as: KENALOG Apply 1 Application topically 2 (two) times daily.   valsartan 80 MG tablet Commonly known as: DIOVAN TAKE 1 TABLET DAILY (NEED APPOINTMENT)   vitamin C 1000 MG tablet Take 1,000 mg by mouth 3 (three) times daily.   Zinc 22.5 MG Tabs Take by mouth 2 (two) times daily.        PHYSICAL EXAM: VS: BP 134/80 (BP Location: Left Arm, Patient Position: Sitting)   Pulse 93   Ht 6\' 1"  (1.854 m)   Wt 219 lb (99.3 kg)   SpO2 97%   BMI 28.89 kg/m   EXAM: General: Pt appears well and is in NAD  Lungs: Clear with good BS bilat with no rales, rhonchi, or wheezes  Heart: Auscultation: RRR   Extremities:  BL LE: no pretibial edema   Mental Status: Judgment, insight: intact Orientation: oriented to time, place, and person Mood and affect: no depression, anxiety, or agitation   DM Foot Exam 09/30/2022 The skin of the feet is intact without sores or ulcerations.  The pedal pulses are 2+ on right and 2+ on left. The sensation is intact to a screening 5.07, 10 gram monofilament bilaterally   DATA REVIEWED:     ASSESSMENT / PLAN / RECOMMENDATIONS:  1) Type 2 Diabetes Mellitus, Poorly  Controlled , With macrovascular  complications - Most recent A1c of 9.0 %. Goal A1c < 7.0 %.      -Patient has been noted worsening glycemic control - Intolerant to Metformin  -I have attempted to prescribe Jardiance in 2024 but it was cost prohibitive as well as he was skeptical about side effects -I suspect GLP-1 agonist will be cost prohibitive as well -Will start basal insulin, historically he has been very comfortable insulin intake -A new prescription for freestyle libre 3+ was sent to the pharmacy   MEDICATIONS: Start Tresiba 20 units daily  Continue  Humalog (  BG-130/25) TIDQAC   EDUCATION / INSTRUCTIONS: BG monitoring instructions: Patient is instructed to check his blood sugars 3 times a week.  2) Dyslipidemia :   -Unable to tolerate statin therapy due  to myalgias -He is on Zetia  F/U in 3 months    Signed electronically by: Lyndle Herrlich, MD  [facility]  [facility] [address] [place], [place] [address] Phone: [phone] FAX: [phone]   CC: Tresa Garter, MD [address] [place] [address] Phone: [phone]  Fax: [phone]  Return to Endocrinology clinic as below: No future appointments.

## 2023-05-12 ENCOUNTER — Encounter: Payer: Self-pay | Admitting: Internal Medicine

## 2023-05-12 LAB — BASIC METABOLIC PANEL WITH GFR
BUN: 19 mg/dL (ref 7–25)
CO2: 28 mmol/L (ref 20–32)
Calcium: 9.5 mg/dL (ref 8.6–10.3)
Chloride: 99 mmol/L (ref 98–110)
Creat: 1.23 mg/dL (ref 0.70–1.28)
Glucose, Bld: 175 mg/dL — ABNORMAL HIGH (ref 65–99)
Potassium: 4.7 mmol/L (ref 3.5–5.3)
Sodium: 135 mmol/L (ref 135–146)
eGFR: 63 mL/min/{1.73_m2} (ref >=60)

## 2023-05-12 LAB — LIPID PANEL
Cholesterol: 197 mg/dL (ref <200)
HDL: 47 mg/dL (ref >=40)
LDL Cholesterol (Calc): 120 mg/dL — ABNORMAL HIGH
Non-HDL Cholesterol (Calc): 150 mg/dL — ABNORMAL HIGH (ref <130)
Total CHOL/HDL Ratio: 4.2 (calc) (ref <5.0)
Triglycerides: 179 mg/dL — ABNORMAL HIGH (ref <150)

## 2023-05-12 LAB — MICROALBUMIN / CREATININE URINE RATIO
Creatinine, Urine: 112 mg/dL (ref 20–320)
Microalb Creat Ratio: 95 mg/g{creat} — ABNORMAL HIGH (ref <30)
Microalb, Ur: 10.6 mg/dL

## 2023-05-12 MED ORDER — VALSARTAN 160 MG PO TABS
160.0000 mg | ORAL_TABLET | Freq: Every day | ORAL | 3 refills | Status: AC
Start: 2023-05-12 — End: ?

## 2023-05-15 ENCOUNTER — Telehealth: Payer: Self-pay | Admitting: Dietician

## 2023-05-15 DIAGNOSIS — I2510 Atherosclerotic heart disease of native coronary artery without angina pectoris: Secondary | ICD-10-CM

## 2023-05-15 DIAGNOSIS — E7800 Pure hypercholesterolemia, unspecified: Secondary | ICD-10-CM

## 2023-05-15 NOTE — Telephone Encounter (Signed)
  Voicemail for patient prescription request forwarded to MA.

## 2023-05-18 MED ORDER — EZETIMIBE 10 MG PO TABS: 10.0000 mg | ORAL_TABLET | Freq: Every day | ORAL | 0 refills | Status: DC

## 2023-07-02 ENCOUNTER — Other Ambulatory Visit: Payer: Self-pay | Admitting: Student

## 2023-07-02 ENCOUNTER — Encounter: Payer: Self-pay | Admitting: Student

## 2023-07-02 DIAGNOSIS — M25552 Pain in left hip: Secondary | ICD-10-CM

## 2023-07-25 ENCOUNTER — Ambulatory Visit
Admission: RE | Admit: 2023-07-25 | Discharge: 2023-07-25 | Disposition: A | Discharge status: Home / Self Care | Source: Ambulatory Visit | Attending: Student | Admitting: Student

## 2023-07-25 DIAGNOSIS — M25552 Pain in left hip: Secondary | ICD-10-CM

## 2023-08-07 ENCOUNTER — Encounter (HOSPITAL_BASED_OUTPATIENT_CLINIC_OR_DEPARTMENT_OTHER): Payer: Self-pay

## 2023-08-07 ENCOUNTER — Telehealth: Payer: Self-pay

## 2023-08-07 ENCOUNTER — Encounter: Payer: Self-pay | Admitting: Internal Medicine

## 2023-08-07 ENCOUNTER — Telehealth: Payer: Self-pay | Admitting: *Deleted

## 2023-08-07 DIAGNOSIS — I2510 Atherosclerotic heart disease of native coronary artery without angina pectoris: Secondary | ICD-10-CM

## 2023-08-07 DIAGNOSIS — E7800 Pure hypercholesterolemia, unspecified: Secondary | ICD-10-CM

## 2023-08-07 MED ORDER — EZETIMIBE 10 MG PO TABS: 10.0000 mg | ORAL_TABLET | Freq: Every day | ORAL | 3 refills | Status: AC

## 2023-08-07 MED ORDER — VALSARTAN 160 MG PO TABS: 160.0000 mg | ORAL_TABLET | Freq: Every day | ORAL | 3 refills | Status: DC

## 2023-08-07 NOTE — Telephone Encounter (Signed)
   Pre-operative Risk Assessment    Patient Name: Corey [name]  DOB: 04-06-1950 MRN: [id number]   Date of last office visit: 12/10/22 Sheryle Donning, MD Date of next office visit: NONE   Request for Surgical Clearance    Procedure:  LEFT TOTAL HIP ARTHROPLASTY  Date of Surgery:  Clearance 08/31/23                                Surgeon:  DR Liliane Rei Surgeon's Group or Practice Name:  [facility] Phone number:  [phone] Fax number:  [phone]   ATTN: KERRI MAZE   Type of Clearance Requested:   - Medical  - Pharmacy:  Hold Aspirin      Type of Anesthesia:  Spinal   Additional requests/questions:    SignedCollin Deal   08/07/2023, 2:12 PM

## 2023-08-07 NOTE — Telephone Encounter (Signed)
 Pt has been scheduled tele preop appt 08/17/23. Med rec and consent are done.

## 2023-08-07 NOTE — Telephone Encounter (Signed)
 Pt has been scheduled tele preop appt 08/17/23. Med rec and consent are done.     Patient Consent for Virtual Visit        Corey [name] has provided verbal consent on 08/07/2023 for a virtual visit (video or telephone).   CONSENT FOR VIRTUAL VISIT FOR:  Corey [name]  By participating in this virtual visit I agree to the following:  I hereby voluntarily request, consent and authorize [facility] and its employed or contracted physicians, physician assistants, nurse practitioners or other licensed health care professionals (the Practitioner), to provide me with telemedicine health care services (the "Services) as deemed necessary by the treating Practitioner. I acknowledge and consent to receive the Services by the Practitioner via telemedicine. I understand that the telemedicine visit will involve communicating with the Practitioner through live audiovisual communication technology and the disclosure of certain medical information by electronic transmission. I acknowledge that I have been given the opportunity to request an in-person assessment or other available alternative prior to the telemedicine visit and am voluntarily participating in the telemedicine visit.  I understand that I have the right to withhold or withdraw my consent to the use of telemedicine in the course of my care at any time, without affecting my right to future care or treatment, and that the Practitioner or I may terminate the telemedicine visit at any time. I understand that I have the right to inspect all information obtained and/or recorded in the course of the telemedicine visit and may receive copies of available information for a reasonable fee.  I understand that some of the potential risks of receiving the Services via telemedicine include:  Delay or interruption in medical evaluation due to technological equipment failure or disruption; Information transmitted may not be sufficient (e.g. poor  resolution of images) to allow for appropriate medical decision making by the Practitioner; and/or  In rare instances, security protocols could fail, causing a breach of personal health information.  Furthermore, I acknowledge that it is my responsibility to provide information about my medical history, conditions and care that is complete and accurate to the best of my ability. I acknowledge that Practitioner's advice, recommendations, and/or decision may be based on factors not within their control, such as incomplete or inaccurate data provided by me or distortions of diagnostic images or specimens that may result from electronic transmissions. I understand that the practice of medicine is not an exact science and that Practitioner makes no warranties or guarantees regarding treatment outcomes. I acknowledge that a copy of this consent can be made available to me via my patient portal [facility] MyChart), or I can request a printed copy by calling the office of [facility].    I understand that my insurance will be billed for this visit.   I have read or had this consent read to me. I understand the contents of this consent, which adequately explains the benefits and risks of the Services being provided via telemedicine.  I have been provided ample opportunity to ask questions regarding this consent and the Services and have had my questions answered to my satisfaction. I give my informed consent for the services to be provided through the use of telemedicine in my medical care

## 2023-08-11 ENCOUNTER — Ambulatory Visit: Admitting: Internal Medicine

## 2023-08-11 ENCOUNTER — Encounter: Payer: Self-pay | Admitting: Internal Medicine

## 2023-08-11 VITALS — BP 138/84 | HR 105 | Ht 73.0 in | Wt 218.0 lb

## 2023-08-11 DIAGNOSIS — Z794 Long term (current) use of insulin: Secondary | ICD-10-CM | POA: Diagnosis not present

## 2023-08-11 DIAGNOSIS — E1165 Type 2 diabetes mellitus with hyperglycemia: Secondary | ICD-10-CM

## 2023-08-11 LAB — POCT GLYCOSYLATED HEMOGLOBIN (HGB A1C): Hemoglobin A1C: 7.2 % — AB (ref 4.0–5.6)

## 2023-08-11 MED ORDER — TRESIBA FLEXTOUCH 100 UNIT/ML ~~LOC~~ SOPN: 16.0000 [IU] | PEN_INJECTOR | Freq: Every day | SUBCUTANEOUS | 4 refills | Status: DC

## 2023-08-11 NOTE — Patient Instructions (Addendum)
 A1c 7.2% Decrease Tresiba  16 units daily  Take Humalog  4 units with each meal Humalog  correctional insulin : Use the scale below to help guide you before each meal in addition to Humalog  4 units.  Blood sugar before meal Number of units to inject  Less than 155 0 unit  156- 180 1 units  181 - 205 2 units  206 - 230 3 units  231 - 255 4 units  256 - 280 5 units  281 - 305 6 units     HOW TO TREAT LOW BLOOD SUGARS (Blood sugar LESS THAN 70 MG/DL) Please follow the RULE OF 15 for the treatment of hypoglycemia treatment (when your (blood sugars are less than 70 mg/dL)   STEP 1: Take 15 grams of carbohydrates when your blood sugar is low, which includes:  3-4 GLUCOSE TABS  OR 3-4 OZ OF JUICE OR REGULAR SODA OR ONE TUBE OF GLUCOSE GEL    STEP 2: RECHECK blood sugar in 15 MINUTES STEP 3: If your blood sugar is still low at the 15 minute recheck --> then, go back to STEP 1 and treat AGAIN with another 15 grams of carbohydrates.

## 2023-08-11 NOTE — Progress Notes (Signed)
 Name: Corey [name]  Age/ Sex: 73 y.o., male   MRN/ DOB: [id number], [birth year]     PCP: Genia Kettering, MD   Reason for Endocrinology Evaluation: Type 2 Diabetes Mellitus  Initial Endocrine Consultative Visit: 07/13/2018    PATIENT IDENTIFIER: Corey [name] is a 73 y.o. male with a past medical history of HTN , Chronic Urticaria , T2DM, CAD and Hx of nephrolithiasis . The patient has followed with Endocrinology clinic since 07/13/2018 for consultative assistance with management of his diabetes.  DIABETIC HISTORY:  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 He was started on an MDI regimen. Pt is not a big fan of western medicine.    Pt has a diagnosis of chronic urticaria for years, his last attack of this was in January, 2020 and having to go on prednisone  80 mg daily with injections as well, that he attributes his newly diagnosed DM to   Prandial insulin  stopped 08/2018 Basal insulin  stopped 12/2018   Intolerant to Metformin  due to chest pains - 2023  Restarted Humalog  per correction scale with an A1c of 7.9% 03/2022  Started basal insulin  04/2023 with an A1c of 9.0%  SUBJECTIVE:   During the last visit (05/11/2023): A1c 9.0 %.     Today (08/11/2023): [name] is here for a  follow up on his diabetes management.    He checks his blood sugars 3x daily. The patient has had hypoglycemic episodes since the last clinic visit.    He is scheduled for left hip arthroplasty 08/31/2023 Denies nausea or vomiting  Denies constipation or diarrhea    HOME DIABETES REGIMEN:  Tresiba  20 units daily Humalog  (BG -120/25) TIDQAC Diovan  160 Mg daily  CONTINUOUS GLUCOSE MONITORING RECORD INTERPRETATION    Dates of Recording: 6/4 - 08/11/2023  Sensor description: Freestyle libre  Results statistics:   CGM use % of time 95  Average and SD 154/31   Time in range 72     %  % Time Above 180 23  % Time above 250 5  % Time Below target 0   Glycemic patterns summary: BGs are optimal overnight and fluctuate during the day  Hyperglycemic episodes postprandial  Hypoglycemic episodes occurred rarely  Overnight periods: Optimal     DIABETIC COMPLICATIONS: Microvascular complications:    Denies: retinopathy, neuropathy, CKD Last eye exam: Completed 2023   Macrovascular complications:   CAD (S/P CABG)  Denies:  PVD, CVA      HISTORY:  Past Medical History:  Past Medical History:  Diagnosis Date   Allergic rhinitis    Allergy     Arthritis    Asthma    Barrett's esophagus    Cataract    removed both eyes with lens replacement Dr Candi Chafe   Complication of anesthesia    took them 6 hours to wake me up   Diabetes mellitus without complication (HCC)    GERD (gastroesophageal reflux disease)    Heart murmur    History of kidney stones    HTN (hypertension)    Hyperlipidemia    Pneumonia    as a kid   Past Surgical History:  Past Surgical History:  Procedure Laterality Date   CATARACT EXTRACTION, BILATERAL     with lens implants    COLONOSCOPY  01/15/2009   normal    CORONARY ARTERY BYPASS GRAFT N/A 07/07/2019  Procedure: CORONARY ARTERY BYPASS GRAFTING (CABG) using LIMA to LAD; Free IMA to RCA.;  Surgeon: Gardenia Jump, MD;  Location: [facility] OR;  Service: Open Heart Surgery;  Laterality: N/A;  BILATERAL IMA   ENDOVEIN HARVEST OF GREATER SAPHENOUS VEIN Right 07/07/2019   Procedure: Astrid Blamer Of Greater Saphenous Vein;  Surgeon: Gardenia Jump, MD;  Location[facility] OR;  Service: Open Heart Surgery;  Laterality: Right;   ESOPHAGOGASTRODUODENOSCOPY  03/20/2006   EYE SURGERY     KIDNEY STONE SURGERY     x 4 - most recent 04-2018 at [facility]    LEFT HEART CATH AND CORONARY ANGIOGRAPHY N/A 07/05/2019   Procedure: LEFT HEART CATH AND CORONARY ANGIOGRAPHY;  Surgeon: [name], Peter M, MD;  Location: [facility]  INVASIVE CV LAB;  Service: Cardiovascular;  Laterality: N/A;   TEE WITHOUT CARDIOVERSION N/A 07/07/2019   Procedure: TRANSESOPHAGEAL ECHOCARDIOGRAM (TEE);  Surgeon: Gardenia Jump, MD;  Location[facility] OR;  Service: Open Heart Surgery;  Laterality: N/A;   TONSILLECTOMY     TOTAL HIP ARTHROPLASTY Right 04/30/2016   Procedure: RIGHT TOTAL HIP ARTHROPLASTY ANTERIOR APPROACH;  Surgeon: Liliane Rei, MD;  Location: [facility] ORS;  Service: Orthopedics;  Laterality: Right;  requests   TRANSTHORACIC ECHOCARDIOGRAM  01/05/1998   UPPER GASTROINTESTINAL ENDOSCOPY     Social History:  reports that he has never smoked. He has never used smokeless tobacco. He reports that he does not drink alcohol and does not use drugs. Family History:  Family History  Problem Relation Age of Onset   Colon polyps Mother    Heart disease Mother    Diabetes Mother    Heart disease Father    Hypertension Other    Colon cancer Neg Hx    Esophageal cancer Neg Hx    Rectal cancer Neg Hx    Stomach cancer Neg Hx      HOME MEDICATIONS: Allergies as of 08/11/2023       Reactions   Nexletol  [bempedoic Acid ] Other (See Comments)   Clots in urine per pt   Other Hives   Peanut-containing Drug Products Anaphylaxis   Shellfish-derived Products Anaphylaxis   Tomato Hives   Chocolate Hives   Albuterol    REACTION: feels like drowning   Aspirin  Other (See Comments)   Causes bruising   Ciprofloxacin    REACTION: rash   Clarithromycin    REACTION: swelling   Colesevelam    REACTION: constip   Enalapril Maleate    REACTION: Erectile dysfunction   Esomeprazole Magnesium     REACTION: epigastric pain   Metformin  And Related    CP   Niacin    REACTION: diarrhea   Pravastatin Sodium    REACTION: aching, listless   Rosuvastatin    REACTION: achy   Sulfamethoxazole Dermatitis   Penicillins Hives   Has patient had a PCN reaction causing immediate rash, facial/tongue/throat swelling, SOB or lightheadedness with  hypotension: Yes Has patient had a PCN reaction causing severe rash involving mucus membranes or skin necrosis: Yes Has patient had a PCN reaction that required hospitalization No Has patient had a PCN reaction occurring within the last 10 years: No If all of the above answers are NO, then may proceed with Cephalosporin use.        Medication List        Accurate as of August 11, 2023  9:31 AM. If you have any questions, ask your nurse or doctor.  aspirin  EC 81 MG tablet Take 1 tablet (81 mg total) by mouth daily.   cetirizine  10 MG tablet Commonly known as: ZYRTEC  Take 10 mg by mouth 2 (two) times daily. Takes twice daily   cholecalciferol 25 MCG (1000 UNIT) tablet Commonly known as: VITAMIN D3 Take 5,000 Units by mouth daily.   ezetimibe  10 MG tablet Commonly known as: ZETIA  Take 1 tablet (10 mg total) by mouth daily.   fluticasone 0.05 % cream Commonly known as: CUTIVATE Apply topically.   FreeStyle Libre 3 Plus Sensor Misc 1 Device by Other route every 14 (fourteen) days. Change sensor every 15 days.   hydrOXYzine  25 MG tablet Commonly known as: ATARAX  Take 1 tablet (25 mg total) by mouth every 8 (eight) hours as needed for itching.   insulin  lispro 100 UNIT/ML KwikPen Commonly known as: HumaLOG  KwikPen Max daily 30 units   Insulin  Pen Needle 32G X 4 MM Misc 1 Device by Does not apply route in the morning, at noon, in the evening, and at bedtime.   ketoconazole 2 % shampoo Commonly known as: NIZORAL Apply topically every other day.   Magnesium  400 MG Caps Take by mouth 3 (three) times daily.   OneTouch Verio test strip Generic drug: glucose blood 1 each by Other route in the morning, at noon, in the evening, and at bedtime. Use as instructed   OneTouch Verio w/Device Kit 1 Device by Does not apply route daily in the afternoon.   pantoprazole  40 MG tablet Commonly known as: PROTONIX  Take 1 tablet (40 mg total) by mouth daily. What  changed: how much to take   tadalafil  5 MG tablet Commonly known as: CIALIS  Take 1 tablet (5 mg total) by mouth daily as needed for erectile dysfunction. Overdue for Annual appt must see provider for future refills   Tresiba  FlexTouch 100 UNIT/ML FlexTouch Pen Generic drug: insulin  degludec Inject 20 Units into the skin daily.   triamcinolone cream 0.1 % Commonly known as: KENALOG Apply 1 Application topically 2 (two) times daily.   valsartan  160 MG tablet Commonly known as: DIOVAN  Take 1 tablet (160 mg total) by mouth daily.   vitamin C 1000 MG tablet Take 1,000 mg by mouth 3 (three) times daily.   Zinc 22.5 MG Tabs Take by mouth 2 (two) times daily.        PHYSICAL EXAM: VS: BP 138/84 (BP Location: Right Arm, Patient Position: Sitting, Cuff Size: Normal)   Pulse (!) 105   Ht 6' 1 (1.854 m)   Wt 218 lb (98.9 kg)   SpO2 98%   BMI 28.76 kg/m   EXAM: General: Pt appears well and is in NAD  Lungs: Clear with good BS bilat   Heart: Auscultation: RRR   Extremities:  BL LE: no pretibial edema   Mental Status: Judgment, insight: intact Orientation: oriented to time, place, and person Mood and affect: no depression, anxiety, or agitation   DM Foot Exam 08/11/2023 The skin of the feet is intact without sores or ulcerations.  The pedal pulses are 2+ on right and 2+ on left. The sensation is intact to a screening 5.07, 10 gram monofilament bilaterally   DATA REVIEWED:   Latest Reference Range & Units 05/11/23 14:10  Sodium 135 - 146 mmol/L 135  Potassium 3.5 - 5.3 mmol/L 4.7  Chloride 98 - 110 mmol/L 99  CO2 20 - 32 mmol/L 28  Glucose 65 - 99 mg/dL 782 (H)  BUN 7 - 25 mg/dL 19  Creatinine  0.70 - 1.28 mg/dL 4.78  Calcium  8.6 - 10.3 mg/dL 9.5  BUN/Creatinine Ratio 6 - 22 (calc) SEE NOTE:  eGFR > OR = 60 mL/min/1.81m2 63  Total CHOL/HDL Ratio <5.0 (calc) 4.2  Cholesterol <200 mg/dL 295  HDL Cholesterol > OR = 40 mg/dL 47  LDL Cholesterol (Calc) mg/dL (calc) 621  (H)  MICROALB/CREAT RATIO <30 mg/g creat 95 (H)  Non-HDL Cholesterol (Calc) <130 mg/dL (calc) 308 (H)  Triglycerides <150 mg/dL 657 (H)    Latest Reference Range & Units 05/11/23 14:10  Microalb, Ur mg/dL 84.6  MICROALB/CREAT RATIO <30 mg/g creat 95 (H)  Creatinine, Urine 20 - 320 mg/dL 962      ASSESSMENT / PLAN / RECOMMENDATIONS:  1) Type 2 Diabetes Mellitus, optimally Controlled , With microalbuminuria and macrovascular  complications - Most recent A1c of 7.2%. Goal A1c < 7.0 %.     -A1c down from 9.0% to 7.2% - Intolerant to Metformin   -I have attempted to prescribe Jardiance  in 2024 but it was cost prohibitive as well as he was skeptical about side effects -GLP-1 agonist cost prohibitive - I will decrease basal insulin  as below due to hypoglycemia overnight - Patient will be provided with a standing dose of Humalog  before each meal as he continues to have postprandial hyperglycemia up to 300 Mg/DL - He will continue to use the correction scale as needed before each meal if needed   MEDICATIONS: Decrease Tresiba  16 units daily  Take Humalog  4 units with each meal Continue  Humalog  ( BG-130/25) TIDQAC   EDUCATION / INSTRUCTIONS: BG monitoring instructions: Patient is instructed to check his blood sugars 3 times a week.  2) Dyslipidemia :   -Unable to tolerate statin therapy due to myalgias -He is on Zetia  -Will encourage low-fat diet and continue to monitor, will consider PCSK9 inhibitors in the future   3) Microalbuminuria:  -This is new -Will encourage optimizing glucose control -He is already on Diovan ,  Medication  Start Diovan  160 mg daily   F/U in 3 months    Signed electronically by: Natale Bail, MD  [facility] Endocrinology  [facility] [address] [place], [place] [address] Phone: [phone] FAX: [phone]   CC: Genia Kettering, MD [address] [place] [address] Phone: [phone]   Fax: [phone]  Return to Endocrinology clinic as below: Future Appointments  Date Time Provider Department Center  08/11/2023  9:50 AM Marybella Ethier, Julian Obey, MD [facility] None  08/17/2023  9:20 AM [facility] HVT PRE OP CLEARANCE APP [facility] H&V  08/24/2023 10:00 AM [facility] PAT 2 [facility] None

## 2023-08-17 ENCOUNTER — Ambulatory Visit: Attending: Internal Medicine

## 2023-08-17 DIAGNOSIS — Z01810 Encounter for preprocedural cardiovascular examination: Secondary | ICD-10-CM | POA: Diagnosis not present

## 2023-08-17 NOTE — Progress Notes (Signed)
 Virtual Visit via Telephone Note   Because of Corey [name] co-morbid illnesses, he is at least at moderate risk for complications without adequate follow up.  This format is felt to be most appropriate for this patient at this time.  Due to technical limitations with video connection (technology), today's appointment will be conducted as an audio only telehealth visit, and Corey [name] verbally agreed to proceed in this manner.   All issues noted in this document were discussed and addressed.  No physical exam could be performed with this format.  Evaluation Performed:  Preoperative cardiovascular risk assessment _____________   Date:  08/17/2023   Patient ID:  Corey [name], DOB 1950/03/08, MRN [id number] Patient Location:  Home Provider location:   Office  Primary Care Provider:  Garald Karlynn GAILS, MD Primary Cardiologist:  Shelda Bruckner, MD  Chief Complaint / Patient Profile   73 y.o. y/o male with a h/o essential hypertension, coronary artery disease, type 2 diabetes who is pending left hip arthroplasty and presents today for telephonic preoperative cardiovascular risk assessment.  History of Present Illness    Corey [name] is a 73 y.o. male who presents via audio/video conferencing for a telehealth visit today.  Pt was last seen in cardiology clinic on 12/10/2022 by Dr. Bruckner.  At that time Corey [name] was doing well .  The patient is now pending procedure as outlined above. Since his last visit, he continues to be stable from a cardiac standpoint.  Today he denies chest pain, shortness of breath, lower extremity edema, fatigue, palpitations, melena, hematuria, hemoptysis, diaphoresis, weakness, presyncope, syncope, orthopnea, and PND.   Past Medical History    Past Medical History:  Diagnosis Date   Allergic rhinitis    Allergy     Arthritis    Asthma    Barrett's esophagus    Cataract    removed both eyes with lens replacement Dr  Octavia   Complication of anesthesia    took them 6 hours to wake me up   Diabetes mellitus without complication (HCC)    GERD (gastroesophageal reflux disease)    Heart murmur    History of kidney stones    HTN (hypertension)    Hyperlipidemia    Pneumonia    as a kid   Past Surgical History:  Procedure Laterality Date   CATARACT EXTRACTION, BILATERAL     with lens implants    COLONOSCOPY  01/15/2009   normal    CORONARY ARTERY BYPASS GRAFT N/A 07/07/2019   Procedure: CORONARY ARTERY BYPASS GRAFTING (CABG) using LIMA to LAD; Free IMA to RCA.;  Surgeon: Dusty Sudie DEL, MD;  [facility] OR;  Service: Open Heart Surgery;  Laterality: N/A;  BILATERAL IMA   ENDOVEIN HARVEST OF GREATER SAPHENOUS VEIN Right 07/07/2019   Procedure: Jethro Rubins Of Greater Saphenous Vein;  Surgeon: Dusty Sudie DEL, MD;  Location: [facility] OR;  Service: Open Heart Surgery;  Laterality: Right;   ESOPHAGOGASTRODUODENOSCOPY  03/20/2006   EYE SURGERY     KIDNEY STONE SURGERY     x 4 - most recent 04-2018 at [facility]    LEFT HEART CATH AND CORONARY ANGIOGRAPHY N/A 07/05/2019   Procedure: LEFT HEART CATH AND CORONARY ANGIOGRAPHY;  Surgeon: [name], Peter M, MD;  Location: [facility] INVASIVE CV LAB;  Service: Cardiovascular;  Laterality: N/A;   TEE WITHOUT CARDIOVERSION N/A 07/07/2019   Procedure: TRANSESOPHAGEAL ECHOCARDIOGRAM (TEE);  Surgeon: Dusty Sudie DEL, MD;  Location: [facility] OR;  Service: Open  Heart Surgery;  Laterality: N/A;   TONSILLECTOMY     TOTAL HIP ARTHROPLASTY Right 04/30/2016   Procedure: RIGHT TOTAL HIP ARTHROPLASTY ANTERIOR APPROACH;  Surgeon: Dempsey Moan, MD;  Location: [facility]S;  Service: Orthopedics;  Laterality: Right;  requests   TRANSTHORACIC ECHOCARDIOGRAM  01/05/1998   UPPER GASTROINTESTINAL ENDOSCOPY      Allergies  Allergies  Allergen Reactions   Nexletol  [Bempedoic Acid ] Other (See Comments)    Clots in urine per pt   Other Hives   Peanut-Containing Drug Products  Anaphylaxis   Shellfish-Derived Products Anaphylaxis   Tomato Hives   Chocolate Hives   Albuterol     REACTION: feels like drowning   Aspirin  Other (See Comments)    Causes bruising   Ciprofloxacin     REACTION: rash   Clarithromycin     REACTION: swelling   Colesevelam     REACTION: constip   Enalapril Maleate     REACTION: Erectile dysfunction   Esomeprazole Magnesium      REACTION: epigastric pain   Metformin  And Related     CP   Niacin     REACTION: diarrhea   Pravastatin Sodium     REACTION: aching, listless   Rosuvastatin     REACTION: achy   Sulfamethoxazole Dermatitis   Penicillins Hives    Has patient had a PCN reaction causing immediate rash, facial/tongue/throat swelling, SOB or lightheadedness with hypotension: Yes Has patient had a PCN reaction causing severe rash involving mucus membranes or skin necrosis: Yes Has patient had a PCN reaction that required hospitalization No Has patient had a PCN reaction occurring within the last 10 years: No If all of the above answers are NO, then may proceed with Cephalosporin use.     Home Medications    Prior to Admission medications   Medication Sig Start Date End Date Taking? Authorizing Provider  Ascorbic Acid (VITAMIN C) 1000 MG tablet Take 1,000 mg by mouth 3 (three) times daily.    [provider]  aspirin  EC 81 MG EC tablet Take 1 tablet (81 mg total) by mouth daily. 07/11/19   Roddenberry, Myron G, PA-C  Blood Glucose Monitoring Suppl (ONETOUCH VERIO) w/Device KIT 1 Device by Does not apply route daily in the afternoon. 03/27/22   Shamleffer, Ibtehal Jaralla, MD  cetirizine  (ZYRTEC ) 10 MG tablet Take 10 mg by mouth 2 (two) times daily. Takes twice daily    [provider]  cholecalciferol (VITAMIN D3) 25 MCG (1000 UT) tablet Take 5,000 Units by mouth daily.    [provider]  Continuous Glucose Sensor (FREESTYLE LIBRE 3 PLUS SENSOR) MISC 1 Device by Other route every 14 (fourteen)  days. Change sensor every 15 days. 05/11/23   Shamleffer, Ibtehal Jaralla, MD  ezetimibe  (ZETIA ) 10 MG tablet Take 1 tablet (10 mg total) by mouth daily. 08/07/23   Lonni Slain, MD  fluticasone (CUTIVATE) 0.05 % cream Apply topically. 02/19/23   [provider]  glucose blood (ONETOUCH VERIO) test strip 1 each by Other route in the morning, at noon, in the evening, and at bedtime. Use as instructed 03/27/22   Shamleffer, Ibtehal Jaralla, MD  hydrOXYzine  (ATARAX ) 25 MG tablet Take 1 tablet (25 mg total) by mouth every 8 (eight) hours as needed for itching. 09/30/22   Shamleffer, Ibtehal Jaralla, MD  insulin  degludec (TRESIBA  FLEXTOUCH) 100 UNIT/ML FlexTouch Pen Inject 16 Units into the skin daily. 08/11/23   Shamleffer, Ibtehal Jaralla, MD  insulin  lispro (HUMALOG  KWIKPEN) 100 UNIT/ML KwikPen Max  daily 30 units 05/11/23   Shamleffer, Donell Cardinal, MD  Insulin  Pen Needle 32G X 4 MM MISC 1 Device by Does not apply route in the morning, at noon, in the evening, and at bedtime. 05/11/23   Shamleffer, Ibtehal Jaralla, MD  ketoconazole (NIZORAL) 2 % shampoo Apply topically every other day. 03/08/23   [provider]  Magnesium  400 MG CAPS Take by mouth 3 (three) times daily.    [provider]  pantoprazole  (PROTONIX ) 40 MG tablet Take 1 tablet (40 mg total) by mouth daily. Patient taking differently: Take 20 mg by mouth daily. 12/31/21   Plotnikov, Aleksei V, MD  tadalafil  (CIALIS ) 5 MG tablet Take 1 tablet (5 mg total) by mouth daily as needed for erectile dysfunction. Overdue for Annual appt must see provider for future refills 04/08/22   Plotnikov, Aleksei V, MD  triamcinolone cream (KENALOG) 0.1 % Apply 1 Application topically 2 (two) times daily. 11/14/22   [provider]  valsartan  (DIOVAN ) 160 MG tablet Take 1 tablet (160 mg total) by mouth daily. 08/07/23   Lonni Slain, MD  Zinc 22.5 MG TABS Take by mouth 2 (two) times daily.    [provider]    Physical Exam    Vital Signs:  Corey [name] does not have vital signs available for review today.  Given telephonic nature of communication, physical exam is limited. AAOx3. NAD. Normal affect.  Speech and respirations are unlabored.  Accessory Clinical Findings    None  Assessment & Plan    1.  Preoperative Cardiovascular Risk Assessment:Procedure:  LEFT TOTAL HIP ARTHROPLASTY   Date of Surgery:  Clearance 08/31/23                                  Surgeon:  DR DEMPSEY MOAN Surgeon's Group or Practice Name:  [facility] Phone number:  [phone] Fax number:  [phone]         Primary Cardiologist: Slain Lonni, MD  Chart reviewed as part of pre-operative protocol coverage. Given past medical history and time since last visit, based on ACC/AHA guidelines, Corey [name] would be at acceptable risk for the planned procedure without further cardiovascular testing.   His RCRI is moderate risk, 6.6% risk of major cardiac event.  He is able to complete greater than 4 METS of physical activity.  Patient was advised that if he develops new symptoms prior to surgery to contact our office to arrange a follow-up appointment.  He verbalized understanding.  Regarding ASA therapy, we recommend continuation of ASA throughout the perioperative period. However, if the surgeon feels that cessation of ASA is required in the perioperative period, it may be stopped 5-7 days prior to surgery with a plan to resume it as soon as felt to be feasible from a surgical standpoint in the post-operative period.   I will route this recommendation to the requesting party via Epic fax function and remove from pre-op pool.      Time:   Today, I have spent  5  minutes with the patient with telehealth technology discussing medical history, symptoms, and management plan.I spent 10 minutes reviewing patient's past cardiac history and cardiac medications.      Josefa CHRISTELLA Beauvais,  NP  08/17/2023, 6:56 AM

## 2023-08-19 ENCOUNTER — Ambulatory Visit: Admitting: Internal Medicine

## 2023-08-19 ENCOUNTER — Encounter: Payer: Self-pay | Admitting: Internal Medicine

## 2023-08-19 VITALS — BP 134/70 | HR 100 | Temp 98.6°F | Ht 73.0 in | Wt 219.0 lb

## 2023-08-19 DIAGNOSIS — I10 Essential (primary) hypertension: Secondary | ICD-10-CM | POA: Diagnosis not present

## 2023-08-19 DIAGNOSIS — K2100 Gastro-esophageal reflux disease with esophagitis, without bleeding: Secondary | ICD-10-CM

## 2023-08-19 DIAGNOSIS — J4520 Mild intermittent asthma, uncomplicated: Secondary | ICD-10-CM

## 2023-08-19 DIAGNOSIS — E1165 Type 2 diabetes mellitus with hyperglycemia: Secondary | ICD-10-CM | POA: Diagnosis not present

## 2023-08-19 DIAGNOSIS — Z01818 Encounter for other preprocedural examination: Secondary | ICD-10-CM | POA: Diagnosis not present

## 2023-08-19 DIAGNOSIS — Z951 Presence of aortocoronary bypass graft: Secondary | ICD-10-CM

## 2023-08-19 DIAGNOSIS — E119 Type 2 diabetes mellitus without complications: Secondary | ICD-10-CM

## 2023-08-19 MED ORDER — GVOKE HYPOPEN 1-PACK 1 MG/0.2ML ~~LOC~~ SOAJ: 0.2000 mL | Freq: Every day | SUBCUTANEOUS | 3 refills | Status: DC | PRN

## 2023-08-19 MED ORDER — PROMETHAZINE HCL 25 MG PO TABS: 25.0000 mg | ORAL_TABLET | ORAL | 0 refills | Status: AC | PRN

## 2023-08-19 NOTE — Assessment & Plan Note (Signed)
Stopped insulin 3/21 - on diet, exercise Dr Kelton Pillar

## 2023-08-19 NOTE — Assessment & Plan Note (Signed)
Off Rx 

## 2023-08-19 NOTE — Assessment & Plan Note (Signed)
On Valartan 80 mg/d

## 2023-08-19 NOTE — Assessment & Plan Note (Signed)
 Doing well

## 2023-08-19 NOTE — Progress Notes (Signed)
 Subjective:  Patient ID: Corey [name], male    DOB: May 18, 1950  Age: 73 y.o. MRN: [id number]  CC: Medical Management of Chronic Issues (Clearance for lt hip surgery)   HPI Corey [name] presents for surgical clearance F/u on DM, HTN, CAD  Outpatient Medications Prior to Visit  Medication Sig Dispense Refill   Ascorbic Acid (VITAMIN C WITH ROSE HIPS) 1000 MG tablet Take 1,000 mg by mouth in the morning, at noon, in the evening, and at bedtime.     aspirin  EC 81 MG EC tablet Take 1 tablet (81 mg total) by mouth daily. (Patient taking differently: Take 81 mg by mouth every evening.)     Blood Glucose Monitoring Suppl (ONETOUCH VERIO) w/Device KIT 1 Device by Does not apply route daily in the afternoon. 1 kit 0   cetirizine  (ZYRTEC ) 10 MG tablet Take 10 mg by mouth 2 (two) times daily. Morning & before supper     Continuous Glucose Sensor (FREESTYLE LIBRE 3 PLUS SENSOR) MISC 1 Device by Other route every 14 (fourteen) days. Change sensor every 15 days. 6 each 3   ezetimibe  (ZETIA ) 10 MG tablet Take 1 tablet (10 mg total) by mouth daily. 90 tablet 3   fluticasone (CUTIVATE) 0.05 % cream Apply 1 Application topically daily as needed (hives/itching.).     glucose blood (ONETOUCH VERIO) test strip 1 each by Other route in the morning, at noon, in the evening, and at bedtime. Use as instructed 400 each 3   insulin  degludec (TRESIBA  FLEXTOUCH) 100 UNIT/ML FlexTouch Pen Inject 16 Units into the skin daily. 15 mL 4   insulin  lispro (HUMALOG  KWIKPEN) 100 UNIT/ML KwikPen Max daily 30 units (Patient taking differently: Inject 4 Units into the skin 3 (three) times daily before meals. Max daily 30 units) 30 mL 3   Insulin  Pen Needle 32G X 4 MM MISC 1 Device by Does not apply route in the morning, at noon, in the evening, and at bedtime. 400 each 3   ketoconazole (NIZORAL) 2 % shampoo Apply topically every other day.     L-CITRULLINE PO Take 1-2 capsules by mouth See admin instructions. Take 1  capsule by mouth in the morning with breakfast & take 2 capsules by mouth with lunch     LECITHIN PO Take 3 capsules by mouth in the morning and at bedtime. Sunflower Lecithin 2400mg      Magnesium  Bisglycinate (MAG GLYCINATE PO) Take 400 mg by mouth in the morning, at noon, and at bedtime.     Melatonin 10 MG TABS Take 10 mg by mouth at bedtime.     Menaquinone-7 (FT VITAMIN K2) 100 MCG CAPS Take 200 mcg by mouth in the morning.     omeprazole (PRILOSEC OTC) 20 MG tablet Take 10 mg by mouth every evening.     tadalafil  (CIALIS ) 5 MG tablet Take 1 tablet (5 mg total) by mouth daily as needed for erectile dysfunction. Overdue for Annual appt must see provider for future refills (Patient taking differently: Take 5 mg by mouth daily at 12 noon. Overdue for Annual appt must see provider for future refills) 90 tablet 3   thiamine (VITAMIN B-1) 100 MG tablet Take 100 mg by mouth in the morning.     triamcinolone cream (KENALOG) 0.1 % Apply 1 Application topically 2 (two) times daily as needed (skin irritation/rash.).     valsartan  (DIOVAN ) 160 MG tablet Take 1 tablet (160 mg total) by mouth daily. 90 tablet 3   VITAMIN  D PO Take 5,000 Units by mouth in the morning.     zinc sulfate, 50mg  elemental zinc, 220 (50 Zn) MG capsule Take 110 mg by mouth in the morning and at bedtime.     No facility-administered medications prior to visit.    ROS: Review of Systems  Constitutional:  Negative for appetite change, fatigue and unexpected weight change.  HENT:  Negative for congestion, nosebleeds, sneezing, sore throat and trouble swallowing.   Eyes:  Negative for itching and visual disturbance.  Respiratory:  Negative for cough.   Cardiovascular:  Negative for chest pain, palpitations and leg swelling.  Gastrointestinal:  Negative for abdominal distention, blood in stool, diarrhea and nausea.  Genitourinary:  Negative for frequency and hematuria.  Musculoskeletal:  Positive for arthralgias and gait problem.  Negative for back pain, joint swelling and neck pain.  Skin:  Negative for rash.  Neurological:  Negative for dizziness, tremors, speech difficulty and weakness.  Psychiatric/Behavioral:  Negative for agitation, dysphoric mood, sleep disturbance and suicidal ideas. The patient is not nervous/anxious.     Objective:  BP 134/70   Pulse 100   Temp 98.6 F (37 C) (Oral)   Ht 6' 1 (1.854 m)   Wt 219 lb (99.3 kg)   SpO2 97%   BMI 28.89 kg/m   BP Readings from Last 3 Encounters:  08/24/23 (!) 154/98  08/19/23 134/70  08/11/23 138/84    Wt Readings from Last 3 Encounters:  08/24/23 214 lb 9.6 oz (97.3 kg)  08/19/23 219 lb (99.3 kg)  08/11/23 218 lb (98.9 kg)    Physical Exam Constitutional:      General: He is not in acute distress.    Appearance: Normal appearance. He is well-developed. He is not ill-appearing.     Comments: NAD   Eyes:     Conjunctiva/sclera: Conjunctivae normal.     Pupils: Pupils are equal, round, and reactive to light.   Neck:     Thyroid : No thyromegaly.     Vascular: No JVD.   Cardiovascular:     Rate and Rhythm: Normal rate and regular rhythm.     Heart sounds: Normal heart sounds. No murmur heard.    No friction rub. No gallop.  Pulmonary:     Effort: Pulmonary effort is normal. No respiratory distress.     Breath sounds: Normal breath sounds. No wheezing or rales.  Chest:     Chest wall: No tenderness.  Abdominal:     General: Bowel sounds are normal. There is no distension.     Palpations: Abdomen is soft. There is no mass.     Tenderness: There is no abdominal tenderness. There is no guarding or rebound.   Musculoskeletal:        General: Tenderness present. Normal range of motion.     Cervical back: Normal range of motion.     Right lower leg: No edema.     Left lower leg: No edema.  Lymphadenopathy:     Cervical: No cervical adenopathy.   Skin:    General: Skin is warm and dry.     Findings: No rash.   Neurological:      Mental Status: He is alert and oriented to person, place, and time.     Cranial Nerves: No cranial nerve deficit.     Motor: No abnormal muscle tone.     Coordination: Coordination normal.     Gait: Gait abnormal.     Deep Tendon Reflexes: Reflexes are normal and  symmetric.   Psychiatric:        Behavior: Behavior normal.        Thought Content: Thought content normal.        Judgment: Judgment normal.   Using a walking stick, limping Left hip is very painful with range of motion  Lab Results  Component Value Date   WBC 5.3 08/24/2023   HGB 17.7 (H) 08/24/2023   HCT 53.9 (H) 08/24/2023   PLT 190 08/24/2023   GLUCOSE 128 (H) 08/24/2023   CHOL 197 05/11/2023   TRIG 179 (H) 05/11/2023   HDL 47 05/11/2023   LDLDIRECT 119.0 11/06/2020   LDLCALC 120 (H) 05/11/2023   ALT 76 (H) 12/31/2021   AST 53 (H) 12/31/2021   NA 135 08/24/2023   K 4.4 08/24/2023   CL 104 08/24/2023   CREATININE 0.92 08/24/2023   BUN 19 08/24/2023   CO2 22 08/24/2023   TSH 0.99 12/31/2021   PSA 0.88 12/31/2021   INR 1.2 07/07/2019   HGBA1C 7.2 (A) 08/11/2023   MICROALBUR 10.6 05/11/2023    MR HIP LEFT WO CONTRAST Result Date: 08/05/2023 CLINICAL DATA:  Chronic left hip pain. EXAM: MR OF THE LEFT HIP WITHOUT CONTRAST TECHNIQUE: Multiplanar, multisequence MR imaging was performed. No intravenous contrast was administered. COMPARISON:  None Available. FINDINGS: Soft tissue and Muscle: Mild asymmetric fatty atrophy of the right gluteal musculature.Hamstring tendon origins are intact.Gluteal cuff tendon insertions are intact.Visualized intrapelvic contents are grossly unremarkable. No enlarged lymph nodes identified in the field of view. Bones: Alternating T1 and T2 hypo and hyperintense serpiginous signal abnormality of the left femoral head, most compatible with avascular necrosis, extending from the level of the fovea capitis predominantly along the anterior left femoral head. This involves approximately 10-20%  of the weight-bearing aspect of the articular surface. There is mild surrounding T2 hyperintense edema like signal extending to the level of the femoral neck. No evidence of subchondral collapse. No discrete fracture. Status post right hip arthroplasty with associated susceptibility artifact, which limits evaluation of the surrounding bones and soft tissues. Pubic symphysis is anatomically aligned. Hip: The hip joint is anatomically aligned. Mild superolateral joint space narrowing and marginal osteophytosis.Diffuse labral degeneration with tear of the anterosuperior labrum.Thinning of the superior weight-bearing articular cartilage. No joint effusion. IMPRESSION: 1. Avascular necrosis of the left femoral head with mild surrounding marrow edema. No evidence of subchondral collapse. No discrete fracture. 2. 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 Electronically Signed   By: Harrietta Sherry M.D.   On: 08/05/2023 11:09    Assessment & Plan:   Problem List Items Addressed This Visit     Essential hypertension   On Valartan 80 mg/d      Asthma   Off Rx      GERD   Continue with Protonix  40 mg daily      RESOLVED: Type 2 diabetes mellitus with hyperglycemia, without long-term current use of insulin  (HCC) - Primary   Stopped insulin  3/21 - on diet, exercise Dr Sam      RESOLVED: Type 2 diabetes mellitus without complication, without long-term current use of insulin  (HCC)   GVOKE Hypopen  Rx given      Type 2 diabetes mellitus without obesity (HCC)   GVOKE Hypopen  Rx given      S/P CABG x 2   Doing well      Preoperative clearance   Garrel is planning to have left hip arthroplasty  surgery. He is medically clear for surgery.  He is a low risk for complications. Labs will be obtained closer to the date of pending surgery GVOKE pen was prescribed as needed hypoglycemia Promethazine  for nausea postop as  needed         Meds ordered this encounter  Medications   DISCONTD: Glucagon  (GVOKE HYPOPEN  1-PACK) 1 MG/0.2ML SOAJ    Sig: Inject 0.2 mLs into the skin daily as needed.    Dispense:  0.6 mL    Refill:  3   promethazine  (PHENERGAN ) 25 MG tablet    Sig: Take 1-2 tablets (25-50 mg total) by mouth every 4 (four) hours as needed for up to 7 days for nausea or vomiting.    Dispense:  60 tablet    Refill:  0      Follow-up: Return in about 6 months (around 02/18/2024) for Wellness Exam.  Marolyn Noel, MD

## 2023-08-19 NOTE — Assessment & Plan Note (Signed)
 GVOKE Hypopen  Rx given

## 2023-08-19 NOTE — H&P (Signed)
 TOTAL HIP ADMISSION H&P  Patient is admitted for left total hip arthroplasty.  Subjective:  Chief Complaint: Left hip pain  HPI: Corey [name], 73 y.o. male, has a history of pain and functional disability in the left hip due to arthritis and patient has failed non-surgical conservative treatments for greater than 12 weeks to include NSAID's and/or analgesics, use of assistive devices, and activity modification. Onset of symptoms was abrupt, starting less than 1 year ago with rapidlly worsening course since that time. The patient noted no past surgery on the left hip. Patient currently rates pain in the left hip at 9 out of 10 with activity. Patient has night pain, worsening of pain with activity and weight bearing, and trendelenberg gait. 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 This condition presents safety issues increasing the risk of falls. There is no current active infection.  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   NSTEMI (non-ST elevated myocardial infarction) (HCC) 07/05/2019   Type 2 diabetes mellitus without obesity (HCC) 06/17/2019   Coronary artery calcification seen on CT scan 06/17/2019   Hypercholesteremia 06/17/2019   Coronary atherosclerosis 06/09/2019   Type 2 diabetes mellitus with hyperglycemia, without long-term current use of insulin  (HCC) 07/13/2018   Uncontrolled diabetes mellitus 05/17/2018   Acute renal failure (ARF) (HCC) 05/17/2018   OA (osteoarthritis) of hip 04/30/2016   Osteochondral defect of condyle of femur 12/21/2015   Lumbar radiculopathy 12/12/2015   Leg pain, right 11/21/2015   BPH (benign prostatic hyperplasia) 04/12/2012   Erectile dysfunction  03/25/2012   Trochanteric bursitis of left hip 03/22/2012   Well adult exam 03/13/2011   Urticaria, chronic 10/03/2010   Fatigue 07/19/2010   LIVER FUNCTION TESTS, ABNORMAL, HX OF 01/04/2010   BRONCHITIS, ACUTE 03/20/2009   GASTROENTERITIS 07/14/2008   BARRETTS ESOPHAGUS 01/29/2007   NEPHROLITHIASIS, HX OF 01/29/2007   Dyslipidemia 01/19/2007   Essential hypertension 01/19/2007   ALLERGIC RHINITIS 01/19/2007   Asthma 01/19/2007   GERD 01/19/2007    Past Medical History:  Diagnosis Date   Allergic rhinitis    Allergy     Arthritis    Asthma    Barrett's esophagus    Cataract    removed both eyes with lens replacement Dr Octavia   Complication of anesthesia    took them 6 hours to wake me up   Diabetes mellitus without complication (HCC)    GERD (gastroesophageal reflux disease)    Heart murmur    History of kidney stones    HTN (hypertension)    Hyperlipidemia    Pneumonia    as a kid    Past Surgical History:  Procedure Laterality Date   CATARACT EXTRACTION, BILATERAL     with lens implants    COLONOSCOPY  01/15/2009   normal    CORONARY ARTERY BYPASS GRAFT N/A 07/07/2019   Procedure: CORONARY ARTERY BYPASS GRAFTING (CABG) using LIMA to LAD; Free IMA to RCA.;  Surgeon: Dusty Sudie DEL, MD;  Location: [facility] OR;  Service: Open Heart Surgery;  Laterality: N/A;  BILATERAL IMA   ENDOVEIN HARVEST OF GREATER SAPHENOUS VEIN Right 07/07/2019   Procedure: Jethro Rubins Of Greater Saphenous Vein;  Surgeon: Dusty Sudie DEL, MD;  Location: [facility] OR;  Service: Open Heart Surgery;  Laterality: Right;   ESOPHAGOGASTRODUODENOSCOPY  03/20/2006   EYE SURGERY     KIDNEY STONE SURGERY     x 4 - most recent 04-2018 at [facility]    LEFT HEART CATH AND CORONARY ANGIOGRAPHY N/A 07/05/2019   Procedure: LEFT HEART CATH AND CORONARY ANGIOGRAPHY;  Surgeon: [name], Peter M, MD;  Location: [facility] INVASIVE CV LAB;  Service: Cardiovascular;  Laterality: N/A;   TEE WITHOUT CARDIOVERSION N/A  07/07/2019   Procedure: TRANSESOPHAGEAL ECHOCARDIOGRAM (TEE);  Surgeon: Dusty Sudie DEL, MD;  Location: [facility] OR;  Service: Open Heart Surgery;  Laterality: N/A;   TONSILLECTOMY     TOTAL HIP ARTHROPLASTY Right 04/30/2016   Procedure: RIGHT TOTAL HIP ARTHROPLASTY ANTERIOR APPROACH;  Surgeon: Dempsey Moan, MD;  Location: [facility] ORS;  Service: Orthopedics;  Laterality: Right;  requests   TRANSTHORACIC ECHOCARDIOGRAM  01/05/1998   UPPER GASTROINTESTINAL ENDOSCOPY      Prior to Admission medications   Medication Sig Start Date End Date Taking? Authorizing Provider  Ascorbic Acid (VITAMIN C WITH ROSE HIPS) 1000 MG tablet Take 1,000 mg by mouth in the morning, at noon, in the evening, and at bedtime.   Yes [provider]  aspirin  EC 81 MG EC tablet Take 1 tablet (81 mg total) by mouth daily. Patient taking differently: Take 81 mg by mouth every evening. 07/11/19  Yes Roddenberry, Myron G, PA-C  cetirizine  (ZYRTEC ) 10 MG tablet Take 10 mg by mouth 2 (two) times daily. Morning & before supper   Yes [provider]  ezetimibe  (ZETIA ) 10 MG tablet Take 1 tablet (10 mg total) by mouth daily. 08/07/23  Yes Lonni Slain, MD  fluticasone (CUTIVATE) 0.05 % cream Apply 1 Application topically daily as needed (hives/itching.). 02/19/23  Yes [provider]  insulin  degludec (TRESIBA  FLEXTOUCH) 100 UNIT/ML FlexTouch Pen Inject 16 Units into the skin daily. 08/11/23  Yes Shamleffer, Ibtehal Jaralla, MD  insulin  lispro (HUMALOG  KWIKPEN) 100 UNIT/ML KwikPen Max daily 30 units Patient taking differently: Inject 4 Units into the skin 3 (three) times daily before meals. Max daily 30 units 05/11/23  Yes Shamleffer, Ibtehal Jaralla, MD  ketoconazole (NIZORAL) 2 % shampoo Apply topically every other day. 03/08/23  Yes [provider]  L-CITRULLINE PO Take 1-2 capsules by mouth See admin instructions. Take 1 capsule by mouth in the morning with breakfast & take 2 capsules by mouth  with lunch   Yes [provider]  LECITHIN PO Take 3 capsules by mouth in the morning and at bedtime. Sunflower Lecithin 2400mg    Yes [provider]  Magnesium  Bisglycinate (MAG GLYCINATE PO) Take 400 mg by mouth in the morning, at noon, and at bedtime.   Yes [provider]  Melatonin 10 MG TABS Take 10 mg by mouth at bedtime.   Yes [provider]  Menaquinone-7 (FT VITAMIN K2) 100 MCG CAPS Take 200 mcg by mouth in the morning.   Yes [provider]  omeprazole (PRILOSEC OTC) 20 MG tablet Take 10 mg by mouth every evening.   Yes [provider]  tadalafil  (CIALIS ) 5 MG tablet Take 1 tablet (5 mg total) by mouth daily as needed for erectile dysfunction. Overdue for Annual appt must see provider for future refills Patient taking differently: Take 5 mg by mouth daily at 12 noon. Overdue for Annual appt must see provider for future refills 04/08/22  Yes Plotnikov, Aleksei V, MD  thiamine (VITAMIN B-1) 100 MG tablet Take 100 mg  by mouth in the morning.   Yes [provider]  triamcinolone cream (KENALOG) 0.1 % Apply 1 Application topically 2 (two) times daily as needed (skin irritation/rash.). 11/14/22  Yes [provider]  valsartan  (DIOVAN ) 160 MG tablet Take 1 tablet (160 mg total) by mouth daily. 08/07/23  Yes Lonni Slain, MD  VITAMIN D  PO Take 5,000 Units by mouth in the morning.   Yes [provider]  zinc sulfate, 50mg  elemental zinc, 220 (50 Zn) MG capsule Take 110 mg by mouth in the morning and at bedtime.   Yes [provider]  Blood Glucose Monitoring Suppl (ONETOUCH VERIO) w/Device KIT 1 Device by Does not apply route daily in the afternoon. 03/27/22   Shamleffer, Ibtehal Jaralla, MD  Continuous Glucose Sensor (FREESTYLE LIBRE 3 PLUS SENSOR) MISC 1 Device by Other route every 14 (fourteen) days. Change sensor every 15 days. 05/11/23   Shamleffer, Ibtehal Jaralla, MD  glucose blood (ONETOUCH VERIO)  test strip 1 each by Other route in the morning, at noon, in the evening, and at bedtime. Use as instructed 03/27/22   Shamleffer, Ibtehal Jaralla, MD  Insulin  Pen Needle 32G X 4 MM MISC 1 Device by Does not apply route in the morning, at noon, in the evening, and at bedtime. 05/11/23   Shamleffer, Ibtehal Jaralla, MD    Allergies  Allergen Reactions   Nexletol  [Bempedoic Acid ] Other (See Comments)    Clots in urine per pt   Other Hives    Corn=hives   Peanut-Containing Drug Products Anaphylaxis   Shellfish-Derived Products Anaphylaxis   Tomato Hives   Chocolate Hives   Albuterol     REACTION: feels like drowning   Aspirin  Other (See Comments)    Causes bruising (high doses)   Ciprofloxacin     REACTION: rash   Clarithromycin     REACTION: swelling   Colesevelam     REACTION: constip   Enalapril Maleate     REACTION: Erectile dysfunction   Esomeprazole Magnesium      REACTION: epigastric pain   Niacin     REACTION: diarrhea   Pravastatin Sodium     REACTION: aching, listless   Rosuvastatin     REACTION: achy   Sulfamethoxazole Dermatitis   Metformin  And Related Palpitations    CP   Penicillins Hives    Has patient had a PCN reaction causing immediate rash, facial/tongue/throat swelling, SOB or lightheadedness with hypotension: Yes Has patient had a PCN reaction causing severe rash involving mucus membranes or skin necrosis: Yes Has patient had a PCN reaction that required hospitalization No Has patient had a PCN reaction occurring within the last 10 years: No If all of the above answers are NO, then may proceed with Cephalosporin use.     Social History   Socioeconomic History   Marital status: Divorced    Spouse name: Not on file   Number of children: Not on file   Years of education: Not on file   Highest education level: Doctorate  Occupational History   Occupation: Cabin crew: [employer]  Tobacco Use   Smoking status: Never    Smokeless tobacco: Never  Vaping Use   Vaping status: Never Used  Substance and Sexual Activity   Alcohol use: No   Drug use: No   Sexual activity: Not Currently  Other Topics Concern   Not on file  Social History Narrative   Single   Regular Exercise-yes   Occupation: data  Animal nutritionist for [employer]            Social Drivers of Health   Financial Resource Strain: Low Risk  (08/17/2023)   Overall Financial Resource Strain (CARDIA)    Difficulty of Paying Living Expenses: Not hard at all  Food Insecurity: No Food Insecurity (08/17/2023)   Hunger Vital Sign    Worried About Running Out of Food in the Last Year: Never true    Ran Out of Food in the Last Year: Never true  Transportation Needs: No Transportation Needs (08/17/2023)   PRAPARE - Administrator, Civil Service (Medical): No    Lack of Transportation (Non-Medical): No  Physical Activity: Insufficiently Active (08/17/2023)   Exercise Vital Sign    Days of Exercise per Week: 4 days    Minutes of Exercise per Session: 30 min  Stress: No Stress Concern Present (08/17/2023)   [employer] of Occupational Health - Occupational Stress Questionnaire    Feeling of Stress: Only a little  Social Connections: Moderately Integrated (08/17/2023)   Social Connection and Isolation Panel    Frequency of Communication with Friends and Family: More than three times a week    Frequency of Social Gatherings with Friends and Family: Three times a week    Attends Religious Services: More than 4 times per year    Active Member of Clubs or Organizations: Yes    Attends Banker Meetings: More than 4 times per year    Marital Status: Divorced  Intimate Partner Violence: Unknown (12/02/2021)   Received from [facility]   HITS    Physically Hurt: Not on file    Insult or Talk Down To: Not on file    Threaten Physical Harm: Not on file    Scream or Curse: Not on file    Tobacco Use: Low Risk   (08/17/2023)   Patient History    Smoking Tobacco Use: Never    Smokeless Tobacco Use: Never    Passive Exposure: Not on file   Social History   Substance and Sexual Activity  Alcohol Use No    Family History  Problem Relation Age of Onset   Colon polyps Mother    Heart disease Mother    Diabetes Mother    Heart disease Father    Hypertension Other    Colon cancer Neg Hx    Esophageal cancer Neg Hx    Rectal cancer Neg Hx    Stomach cancer Neg Hx     ROS   Objective:  Physical Exam: Well nourished and well developed.  General: Alert and oriented x3, cooperative and pleasant, no acute distress.  Head: normocephalic, atraumatic, neck supple.  Eyes: EOMI. Abdomen: non-tender to palpation and soft, normoactive bowel sounds. Musculoskeletal: - Left Hip: Flexion to 100, internal rotation approximately 10, external rotation 30 with pain, abduction 30 with pain. Antalgic gait pattern noted on the left.  Calves soft and nontender. Motor function intact in LE. Strength 5/5 LE bilaterally. Neuro: Distal pulses 2+. Sensation to light touch intact in LE.  Vital signs in last 24 hours:    Imaging Review Radiographs of the pelvis and lateral left hip from the last visit demonstrate the prosthesis on the right hip in excellent position with no abnormalities. The left hip shows a faint appearance of avascular necrosis. MRI findings reveal significant osteonecrosis of the femoral head involving over 50% of the femoral head, with a small area of collapse, subchondral fracture, and surrounding edema.  Assessment/Plan:  End stage arthritis, left hip  The patient history, physical examination, clinical judgement of the provider and imaging studies are consistent with end stage degenerative joint disease of the left hip and total hip arthroplasty is deemed medically necessary. The treatment options including medical management, injection therapy, arthroscopy and arthroplasty were  discussed at length. The risks and benefits of total hip arthroplasty were presented and reviewed. The risks due to aseptic loosening, infection, stiffness, dislocation/subluxation, thromboembolic complications and other imponderables were discussed. The patient acknowledged the explanation, agreed to proceed with the plan and consent was signed. Patient is being admitted for inpatient treatment for surgery, pain control, PT, OT, prophylactic antibiotics, VTE prophylaxis, progressive ambulation and ADLs and discharge planning.The patient is planning to be discharged home.  Therapy Plans: HEP Disposition: Home with Friend Planned DVT Prophylaxis: Xarelto  10 mg (hx of MI - cannot tolerate more than one 81 mg aspirin ) DME Needed: None PCP: Karlynn Noel, MD (appt 06/25) Cardiologist: Shelda Bruckner, MD (EPIC note 06/23) Endocrinologist: Donell Butts, MD (clearance received) TXA: IV Allergies: **multiple** - albuterol (dyspnea), aspirin  (bruising), cipro (rash), clarithromycin (swelling), penicillin (hives, eye swelling), pravastatin (arthralgias), sulfa (rash), shellfish (anaphylaxis), peanuts (anaphylaxis), LATEX (hives) Anesthesia Concerns: difficulty awakening BMI: 28.4 Last HgbA1c: 7.2% - 07/2023 Pain Regimen: hydrocodone  only - tramadol  causes hallucinations  [facility]: [facility] (deliver to room)  Other: -Per endocrinology, hold tresiba  and humalog  day of surgery -Hx of MI and open heart surgery in 2022  - Patient was instructed on what medications to stop prior to surgery. - Follow-up visit in 2 weeks with Dr. Melodi - Begin physical therapy following surgery - Pre-operative lab work as pre-surgical testing - Prescriptions will be provided in hospital at time of discharge  R. Zelda Kobs, PA-C Orthopedic Surgery [facility] [place]

## 2023-08-20 ENCOUNTER — Other Ambulatory Visit: Payer: Self-pay | Admitting: Internal Medicine

## 2023-08-20 NOTE — Patient Instructions (Addendum)
 SURGICAL WAITING ROOM VISITATION Patients having surgery or a procedure may have no more than 2 support people in the waiting area - these visitors may rotate.    Children under the age of 107 must have an adult with them who is not the patient.  If the patient needs to stay at the hospital during part of their recovery, the visitor guidelines for inpatient rooms apply. Pre-op nurse will coordinate an appropriate time for 1 support person to accompany patient in pre-op.  This support person may not rotate.    Please refer to the [facility] website for the visitor guidelines for Inpatients (after your surgery is over and you are in a regular room).       Your procedure is scheduled on: 08-31-23   Report to [facility] Main Entrance    Report to admitting at 10:20 AM   Call this number if you have problems the morning of surgery [phone]   Do not eat food :After Midnight.   After Midnight you may have the following liquids until 9:50 AM DAY OF SURGERY  Water Non-Citrus Juices (without pulp, NO RED-Apple, White grape, White cranberry) Black Coffee (NO MILK/CREAM OR CREAMERS, sugar ok)  Clear Tea (NO MILK/CREAM OR CREAMERS, sugar ok) regular and decaf                             Plain Jell-O (NO RED)                                           Fruit ices (not with fruit pulp, NO RED)                                     Popsicles (NO RED)                                                               Sports drinks like Gatorade (NO RED)                   The day of surgery:  Drink ONE (1) Pre-Surgery G2 by 9:50 AM the morning of surgery. Drink in one sitting. Do not sip.  This drink was given to you during your hospital  pre-op appointment visit. Nothing else to drink after completing the Pre-Surgery G2.          If you have questions, please contact your surgeon's office.   FOLLOW  ANY ADDITIONAL PRE OP INSTRUCTIONS YOU RECEIVED FROM YOUR SURGEON'S OFFICE!!!      Oral Hygiene is also important to reduce your risk of infection.                                    Remember - BRUSH YOUR TEETH THE MORNING OF SURGERY WITH YOUR REGULAR TOOTHPASTE   Do NOT smoke after Midnight   Take these medicines the morning of surgery with A SIP OF WATER:    Ezetimibe    Zyrtec   Promethazine  if needed  Stop all vitamins and herbal supplements 7 days before surgery  How to Manage Your Diabetes Before and After Surgery  Why is it important to control my blood sugar before and after surgery? Improving blood sugar levels before and after surgery helps healing and can limit problems. A way of improving blood sugar control is eating a healthy diet by:  Eating less sugar and carbohydrates  Increasing activity/exercise  Talking with your doctor about reaching your blood sugar goals High blood sugars (greater than 180 mg/dL) can raise your risk of infections and slow your recovery, so you will need to focus on controlling your diabetes during the weeks before surgery. Make sure that the doctor who takes care of your diabetes knows about your planned surgery including the date and location.  How do I manage my blood sugar before surgery? Check your blood sugar at least 4 times a day, starting 2 days before surgery, to make sure that the level is not too high or low. Check your blood sugar the morning of your surgery when you wake up and every 2 hours until you get to the Short Stay unit. If your blood sugar is less than 70 mg/dL, you will need to treat for low blood sugar: Do not take insulin . Treat a low blood sugar (less than 70 mg/dL) with  cup of clear juice (cranberry or apple), 4 glucose tablets, OR glucose gel. Recheck blood sugar in 15 minutes after treatment (to make sure it is greater than 70 mg/dL). If your blood sugar is not greater than 70 mg/dL on recheck, call [phone] for further instructions. Report your blood sugar to the short stay nurse when you  get to Short Stay.  If you are admitted to the hospital after surgery: Your blood sugar will be checked by the staff and you will probably be given insulin  after surgery (instead of oral diabetes medicines) to make sure you have good blood sugar levels. The goal for blood sugar control after surgery is 80-180 mg/dL.   WHAT DO I DO ABOUT MY DIABETES MEDICATION?  Do not take oral diabetes medicines (pills) the morning of surgery.  THE Morning of SURGERY, take 8 units of Tresiba  insulin .    THE MORNING OF SURGERY, If your CBG is greater than 220 mg/dL, you may take  of your sliding scale  (correction) dose of Humalog  insulin .  DO NOT TAKE THE FOLLOWING 7 DAYS PRIOR TO SURGERY: Ozempic, Wegovy, Rybelsus (Semaglutide), Byetta (exenatide), Bydureon (exenatide ER), Victoza, Saxenda (liraglutide), or Trulicity (dulaglutide) Mounjaro (Tirzepatide) Adlyxin (Lixisenatide), Polyethylene Glycol Loxenatide.    Reviewed and Endorsed by [facility] Patient Education Committee, August 2015                              You may not have any metal on your body including  jewelry, and body piercing             Do not wear lotions, powders, cologne, or deodorant              Men may shave face and neck.   Do not bring valuables to the hospital. [name] IS NOT RESPONSIBLE   FOR VALUABLES.   Contacts, dentures or bridgework may not be worn into surgery.   Bring small overnight bag day of surgery.   DO NOT BRING YOUR HOME MEDICATIONS TO THE HOSPITAL. PHARMACY WILL DISPENSE MEDICATIONS LISTED ON YOUR MEDICATION LIST  TO YOU DURING YOUR ADMISSION IN THE HOSPITAL!    Special Instructions: Bring a copy of your healthcare power of attorney and living will documents the day of surgery if you haven't scanned them before.              Please read over the following fact sheets you were given: IF YOU HAVE QUESTIONS ABOUT YOUR PRE-OP INSTRUCTIONS PLEASE CALL [phone] [name]  If you received a COVID test  during your pre-op visit  it is requested that you wear a mask when out in public, stay away from anyone that may not be feeling well and notify your surgeon if you develop symptoms. If you test positive for Covid or have been in contact with anyone that has tested positive in the last 10 days please notify you surgeon.    Pre-operative 5 CHG Bath Instructions   You can play a key role in reducing the risk of infection after surgery. Your skin needs to be as free of germs as possible. You can reduce the number of germs on your skin by washing with CHG (chlorhexidine  gluconate) soap before surgery. CHG is an antiseptic soap that kills germs and continues to kill germs even after washing.   DO NOT use if you have an allergy  to chlorhexidine /CHG or antibacterial soaps. If your skin becomes reddened or irritated, stop using the CHG and notify one of our RNs at [phone].   Please shower with the CHG soap starting 4 days before surgery using the following schedule:     Please keep in mind the following:  DO NOT shave, including legs and underarms, starting the day of your first shower.   You may shave your face at any point before/day of surgery.  Place clean sheets on your bed the day you start using CHG soap. Use a clean washcloth (not used since being washed) for each shower. DO NOT sleep with pets once you start using the CHG.   CHG Shower Instructions:  If you choose to wash your hair and private area, wash first with your normal shampoo/soap.  After you use shampoo/soap, rinse your hair and body thoroughly to remove shampoo/soap residue.  Turn the water OFF and apply about 3 tablespoons (45 ml) of CHG soap to a CLEAN washcloth.  Apply CHG soap ONLY FROM YOUR NECK DOWN TO YOUR TOES (washing for 3-5 minutes)  DO NOT use CHG soap on face, private areas, open wounds, or sores.  Pay special attention to the area where your surgery is being performed.  If you are having back surgery, having  someone wash your back for you may be helpful. Wait 2 minutes after CHG soap is applied, then you may rinse off the CHG soap.  Pat dry with a clean towel  Put on clean clothes/pajamas   If you choose to wear lotion, please use ONLY the CHG-compatible lotions on the back of this paper.     Additional instructions for the day of surgery: DO NOT APPLY any lotions, deodorants, cologne, or perfumes.   Put on clean/comfortable clothes.  Brush your teeth.  Ask your nurse before applying any prescription medications to the skin.      CHG Compatible Lotions   Aveeno Moisturizing lotion  Cetaphil Moisturizing Cream  Cetaphil Moisturizing Lotion  Clairol Herbal Essence Moisturizing Lotion, Dry Skin  Clairol Herbal Essence Moisturizing Lotion, Extra Dry Skin  Clairol Herbal Essence Moisturizing Lotion, Normal Skin  Curel Age Defying Therapeutic Moisturizing Lotion with Alpha Hydroxy  Curel Extreme Care Body Lotion  Curel Soothing Hands Moisturizing Hand Lotion  Curel Therapeutic Moisturizing Cream, Fragrance-Free  Curel Therapeutic Moisturizing Lotion, Fragrance-Free  Curel Therapeutic Moisturizing Lotion, Original Formula  Eucerin Daily Replenishing Lotion  Eucerin Dry Skin Therapy Plus Alpha Hydroxy Crme  Eucerin Dry Skin Therapy Plus Alpha Hydroxy Lotion  Eucerin Original Crme  Eucerin Original Lotion  Eucerin Plus Crme Eucerin Plus Lotion  Eucerin TriLipid Replenishing Lotion  Keri Anti-Bacterial Hand Lotion  Keri Deep Conditioning Original Lotion Dry Skin Formula Softly Scented  Keri Deep Conditioning Original Lotion, Fragrance Free Sensitive Skin Formula  Keri Lotion Fast Absorbing Fragrance Free Sensitive Skin Formula  Keri Lotion Fast Absorbing Softly Scented Dry Skin Formula  Keri Original Lotion  Keri Skin Renewal Lotion Keri Silky Smooth Lotion  Keri Silky Smooth Sensitive Skin Lotion  Nivea Body Creamy Conditioning Oil  Nivea Body Extra Enriched Lotion  Nivea Body  Original Lotion  Nivea Body Sheer Moisturizing Lotion Nivea Crme  Nivea Skin Firming Lotion  NutraDerm 30 Skin Lotion  NutraDerm Skin Lotion  NutraDerm Therapeutic Skin Cream  NutraDerm Therapeutic Skin Lotion  ProShield Protective Hand Cream  Provon moisturizing lotion   PATIENT SIGNATURE_________________________________  NURSE SIGNATURE__________________________________  ________________________________________________________________________    Corey [name]  An incentive spirometer is a tool that can help keep your lungs clear and active. This tool measures how well you are filling your lungs with each breath. Taking long deep breaths may help reverse or decrease the chance of developing breathing (pulmonary) problems (especially infection) following: A long period of time when you are unable to move or be active. BEFORE THE PROCEDURE  If the spirometer includes an indicator to show your best effort, your nurse or respiratory therapist will set it to a desired goal. If possible, sit up straight or lean slightly forward. Try not to slouch. Hold the incentive spirometer in an upright position. INSTRUCTIONS FOR USE  Sit on the edge of your bed if possible, or sit up as far as you can in bed or on a chair. Hold the incentive spirometer in an upright position. Breathe out normally. Place the mouthpiece in your mouth and seal your lips tightly around it. Breathe in slowly and as deeply as possible, raising the piston or the ball toward the top of the column. Hold your breath for 3-5 seconds or for as long as possible. Allow the piston or ball to fall to the bottom of the column. Remove the mouthpiece from your mouth and breathe out normally. Rest for a few seconds and repeat Steps 1 through 7 at least 10 times every 1-2 hours when you are awake. Take your time and take a few normal breaths between deep breaths. The spirometer may include an indicator to show your best effort.  Use the indicator as a goal to work toward during each repetition. After each set of 10 deep breaths, practice coughing to be sure your lungs are clear. If you have an incision (the cut made at the time of surgery), support your incision when coughing by placing a pillow or rolled up towels firmly against it. Once you are able to get out of bed, walk around indoors and cough well. You may stop using the incentive spirometer when instructed by your caregiver.  RISKS AND COMPLICATIONS Take your time so you do not get dizzy or light-headed. If you are in pain, you may need to take or ask for pain medication before doing incentive spirometry. It is harder to take a  deep breath if you are having pain. AFTER USE Rest and breathe slowly and easily. It can be helpful to keep track of a log of your progress. Your caregiver can provide you with a simple table to help with this. If you are using the spirometer at home, follow these instructions: SEEK MEDICAL CARE IF:  You are having difficultly using the spirometer. You have trouble using the spirometer as often as instructed. Your pain medication is not giving enough relief while using the spirometer. You develop fever of 100.5 F (38.1 C) or higher. SEEK IMMEDIATE MEDICAL CARE IF:  You cough up bloody sputum that had not been present before. You develop fever of 102 F (38.9 C) or greater. You develop worsening pain at or near the incision site. MAKE SURE YOU:  Understand these instructions. Will watch your condition. Will get help right away if you are not doing well or get worse. Document Released: 06/23/2006 Document Revised: 05/05/2011 Document Reviewed: 08/24/2006 ExitCare Patient Information 2014 ExitCare, [place].   ________________________________________________________________________ WHAT IS A BLOOD TRANSFUSION? Blood Transfusion Information  A transfusion is the replacement of blood or some of its parts. Blood is made up of multiple  cells which provide different functions. Red blood cells carry oxygen and are used for blood loss replacement. White blood cells fight against infection. Platelets control bleeding. Plasma helps clot blood. Other blood products are available for specialized needs, such as hemophilia or other clotting disorders. BEFORE THE TRANSFUSION  Who gives blood for transfusions?  Healthy volunteers who are fully evaluated to make sure their blood is safe. This is blood bank blood. Transfusion therapy is the safest it has ever been in the practice of medicine. Before blood is taken from a donor, a complete history is taken to make sure that person has no history of diseases nor engages in risky social behavior (examples are intravenous drug use or sexual activity with multiple partners). The donor's travel history is screened to minimize risk of transmitting infections, such as malaria. The donated blood is tested for signs of infectious diseases, such as HIV and hepatitis. The blood is then tested to be sure it is compatible with you in order to minimize the chance of a transfusion reaction. If you or a relative donates blood, this is often done in anticipation of surgery and is not appropriate for emergency situations. It takes many days to process the donated blood. RISKS AND COMPLICATIONS Although transfusion therapy is very safe and saves many lives, the main dangers of transfusion include:  Getting an infectious disease. Developing a transfusion reaction. This is an allergic reaction to something in the blood you were given. Every precaution is taken to prevent this. The decision to have a blood transfusion has been considered carefully by your caregiver before blood is given. Blood is not given unless the benefits outweigh the risks. AFTER THE TRANSFUSION Right after receiving a blood transfusion, you will usually feel much better and more energetic. This is especially true if your red blood cells have  gotten low (anemic). The transfusion raises the level of the red blood cells which carry oxygen, and this usually causes an energy increase. The nurse administering the transfusion will monitor you carefully for complications. HOME CARE INSTRUCTIONS  No special instructions are needed after a transfusion. You may find your energy is better. Speak with your caregiver about any limitations on activity for underlying diseases you may have. SEEK MEDICAL CARE IF:  Your condition is not improving after your transfusion.  You develop redness or irritation at the intravenous (IV) site. SEEK IMMEDIATE MEDICAL CARE IF:  Any of the following symptoms occur over the next 12 hours: Shaking chills. You have a temperature by mouth above 102 F (38.9 C), not controlled by medicine. Chest, back, or muscle pain. People around you feel you are not acting correctly or are confused. Shortness of breath or difficulty breathing. Dizziness and fainting. You get a rash or develop hives. You have a decrease in urine output. Your urine turns a dark color or changes to pink, red, or brown. Any of the following symptoms occur over the next 10 days: You have a temperature by mouth above 102 F (38.9 C), not controlled by medicine. Shortness of breath. Weakness after normal activity. The white part of the eye turns yellow (jaundice). You have a decrease in the amount of urine or are urinating less often. Your urine turns a dark color or changes to pink, red, or brown. Document Released: 02/08/2000 Document Revised: 05/05/2011 Document Reviewed: 09/27/2007 ExitCare Patient Information 2014 Corey [name].  _______________________________________________________________________ View Pre-Surgery Education Videos:  [email or web address]

## 2023-08-20 NOTE — Progress Notes (Addendum)
 Date of COVID positive in last 90 days:  No  PCP - Karlynn Noel, MD Cardiologist - Shelda Bruckner, MD  Cardiac clearance in Epic dated 08-17-23  Chest x-ray - N/A EKG - 12-10-22 Epic Stress Test - N/A ECHO - 05-22-20 Epic Cardiac Cath - 07-05-19 Epic Pacemaker/ICD device last checked: N/A Spinal Cord Stimulator:N/A Long Term Monitor 11-20-22 Epic Cardiac CT - 06-08-19 Epic  Bowel Prep - N/A  Sleep Study - N/A CPAP -   Cathleen Libre L arm  Fasting Blood Sugar - 110 to 115 Checks Blood Sugar - multiple times a day  Last dose of GLP1 agonist-  N/A GLP1 instructions:  Do not take after     Last dose of SGLT-2 inhibitors-  N/A SGLT-2 instructions:  Do not take after     Blood Thinner Instructions:  Last dose:   Time: Aspirin  Instructions:  ASA 81 Last Dose: Per patient to stop 5 days before surgery   Activity level:  Can go up a flight of stairs and perform activities of daily living without stopping and without symptoms of chest pain or shortness of breath.  Anesthesia review: Hx of NSTEMI, hx of CABG, HTN, DM  Patient denies shortness of breath, fever, cough and chest pain at PAT appointment  Patient verbalized understanding of instructions that were given to them at the PAT appointment. Patient was also instructed that they will need to review over the PAT instructions again at home before surgery.

## 2023-08-24 ENCOUNTER — Encounter (HOSPITAL_COMMUNITY): Payer: Self-pay

## 2023-08-24 ENCOUNTER — Encounter (HOSPITAL_COMMUNITY)
Admission: RE | Admit: 2023-08-24 | Discharge: 2023-08-24 | Disposition: A | Discharge status: Home / Self Care | Source: Ambulatory Visit | Attending: Orthopedic Surgery | Admitting: Orthopedic Surgery

## 2023-08-24 ENCOUNTER — Other Ambulatory Visit: Payer: Self-pay

## 2023-08-24 VITALS — BP 154/98 | HR 101 | Temp 98.3°F | Resp 16 | Ht 73.0 in | Wt 214.6 lb

## 2023-08-24 DIAGNOSIS — K219 Gastro-esophageal reflux disease without esophagitis: Secondary | ICD-10-CM | POA: Diagnosis not present

## 2023-08-24 DIAGNOSIS — Z794 Long term (current) use of insulin: Secondary | ICD-10-CM | POA: Diagnosis not present

## 2023-08-24 DIAGNOSIS — I2510 Atherosclerotic heart disease of native coronary artery without angina pectoris: Secondary | ICD-10-CM | POA: Diagnosis not present

## 2023-08-24 DIAGNOSIS — E119 Type 2 diabetes mellitus without complications: Secondary | ICD-10-CM

## 2023-08-24 DIAGNOSIS — J45909 Unspecified asthma, uncomplicated: Secondary | ICD-10-CM | POA: Diagnosis not present

## 2023-08-24 DIAGNOSIS — Z01818 Encounter for other preprocedural examination: Secondary | ICD-10-CM | POA: Diagnosis present

## 2023-08-24 DIAGNOSIS — Z951 Presence of aortocoronary bypass graft: Secondary | ICD-10-CM | POA: Diagnosis not present

## 2023-08-24 DIAGNOSIS — I10 Essential (primary) hypertension: Secondary | ICD-10-CM | POA: Diagnosis not present

## 2023-08-24 DIAGNOSIS — Z01812 Encounter for preprocedural laboratory examination: Secondary | ICD-10-CM | POA: Insufficient documentation

## 2023-08-24 DIAGNOSIS — M1612 Unilateral primary osteoarthritis, left hip: Secondary | ICD-10-CM | POA: Diagnosis not present

## 2023-08-24 LAB — CBC
HCT: 53.9 % — ABNORMAL HIGH (ref 39.0–52.0)
Hemoglobin: 17.7 g/dL — ABNORMAL HIGH (ref 13.0–17.0)
MCH: 29.2 pg (ref 26.0–34.0)
MCHC: 32.8 g/dL (ref 30.0–36.0)
MCV: 88.8 fL (ref 80.0–100.0)
Platelets: 190 10*3/uL (ref 150–400)
RBC: 6.07 MIL/uL — ABNORMAL HIGH (ref 4.22–5.81)
RDW: 14 % (ref 11.5–15.5)
WBC: 5.3 10*3/uL (ref 4.0–10.5)
nRBC: 0 % (ref 0.0–0.2)

## 2023-08-24 LAB — BASIC METABOLIC PANEL WITH GFR
Anion gap: 9 (ref 5–15)
BUN: 19 mg/dL (ref 8–23)
CO2: 22 mmol/L (ref 22–32)
Calcium: 9.3 mg/dL (ref 8.9–10.3)
Chloride: 104 mmol/L (ref 98–111)
Creatinine, Ser: 0.92 mg/dL (ref 0.61–1.24)
GFR, Estimated: >60 mL/min (ref >60)
Glucose, Bld: 128 mg/dL — ABNORMAL HIGH (ref 70–99)
Potassium: 4.4 mmol/L (ref 3.5–5.1)
Sodium: 135 mmol/L (ref 135–145)

## 2023-08-24 LAB — SURGICAL PCR SCREEN
MRSA, PCR: NEGATIVE
Staphylococcus aureus: NEGATIVE

## 2023-08-24 LAB — GLUCOSE, CAPILLARY: Glucose-Capillary: 166 mg/dL — ABNORMAL HIGH (ref 70–99)

## 2023-08-25 ENCOUNTER — Encounter: Payer: Self-pay | Admitting: Internal Medicine

## 2023-08-25 DIAGNOSIS — Z01818 Encounter for other preprocedural examination: Secondary | ICD-10-CM | POA: Insufficient documentation

## 2023-08-25 NOTE — Assessment & Plan Note (Signed)
 Corey [name] is planning to have left hip arthroplasty surgery. He is medically clear for surgery.  He is a low risk for complications. Labs will be obtained closer to the date of pending surgery GVOKE pen was prescribed as needed hypoglycemia Promethazine  for nausea postop as needed

## 2023-08-25 NOTE — Assessment & Plan Note (Signed)
 Continue with Protonix 40 mg daily

## 2023-08-25 NOTE — Anesthesia Preprocedure Evaluation (Addendum)
 Anesthesia Evaluation  Patient identified by MRN, date of birth, ID band Patient awake    Reviewed: Allergy  & Precautions, NPO status , Patient's Chart, lab work & pertinent test results  Airway Mallampati: II  TM Distance: >3 FB Neck ROM: Full    Dental  (+) Dental Advisory Given   Pulmonary asthma    breath sounds clear to auscultation       Cardiovascular hypertension, Pt. on medications + CAD, + Past MI and + CABG   Rhythm:Regular Rate:Normal     Neuro/Psych  Neuromuscular disease    GI/Hepatic Neg liver ROS,GERD  ,,  Endo/Other  diabetes, Type 2    Renal/GU Renal disease     Musculoskeletal  (+) Arthritis ,    Abdominal   Peds  Hematology negative hematology ROS (+)   Anesthesia Other Findings   Reproductive/Obstetrics                              Anesthesia Physical Anesthesia Plan  ASA: 3  Anesthesia Plan: Spinal   Post-op Pain Management: Ofirmev  IV (intra-op)*   Induction:   PONV Risk Score and Plan: 1 and Propofol  infusion, Dexamethasone , Ondansetron  and Treatment may vary due to age or medical condition  Airway Management Planned: Natural Airway and Simple Face Mask  Additional Equipment: None  Intra-op Plan:   Post-operative Plan:   Informed Consent: I have reviewed the patients History and Physical, chart, labs and discussed the procedure including the risks, benefits and alternatives for the proposed anesthesia with the patient or authorized representative who has indicated his/her understanding and acceptance.       Plan Discussed with: CRNA  Anesthesia Plan Comments:         Anesthesia Quick Evaluation

## 2023-08-25 NOTE — Progress Notes (Signed)
 Anesthesia Chart Review   Case: [id number] Date/Time: 08/31/23 1235   Procedure: ARTHROPLASTY, HIP, TOTAL, ANTERIOR APPROACH (Left: Hip)   Anesthesia type: Choice   Diagnosis: Primary osteoarthritis of left hip [M16.12]   Pre-op diagnosis: Left hip osteoarthritis   Location: [facility] ROOM 10 / [facility]   Surgeons: Melodi Lerner, MD       DISCUSSION:73 y.o. never smoker with h/o HTN, asthma, GERD, DM II (A1C 7.2), CAD s/p CABG, left hip OA scheduled for above procedure 08/31/2023 with Dr. Lerner Melodi.   Pt seen by PCP 08/19/2023 for preoperative evaluation.  Per OV note, He is medically clear for surgery. He is a low risk for complications.  Per cardiology preoperative evaluation 08/17/2023, Chart reviewed as part of pre-operative protocol coverage. Given past medical history and time since last visit, based on ACC/AHA guidelines, Aniken D [place] would be at acceptable risk for the planned procedure without further cardiovascular testing.    His RCRI is moderate risk, 6.6% risk of major cardiac event.  He is able to complete greater than 4 METS of physical activity.   Patient was advised that if he develops new symptoms prior to surgery to contact our office to arrange a follow-up appointment.  He verbalized understanding.   Regarding ASA therapy, we recommend continuation of ASA throughout the perioperative period. However, if the surgeon feels that cessation of ASA is required in the perioperative period, it may be stopped 5-7 days prior to surgery with a plan to resume it as soon as felt to be feasible from a surgical standpoint in the post-operative period.  VS: BP (!) 154/98   Pulse (!) 101   Temp 36.8 C (Oral)   Resp 16   Ht 6' 1 (1.854 m)   Wt 97.3 kg   SpO2 99%   BMI 28.31 kg/m   PROVIDERS: Plotnikov, Karlynn GAILS, MD is PCP   Cardiologist - Shelda Bruckner, MD  LABS: Labs reviewed: Acceptable for surgery. (all labs ordered are listed, but only abnormal results are  displayed)  Labs Reviewed  BASIC METABOLIC PANEL WITH GFR - Abnormal; Notable for the following components:      Result Value   Glucose, Bld 128 (*)    All other components within normal limits  CBC - Abnormal; Notable for the following components:   RBC 6.07 (*)    Hemoglobin 17.7 (*)    HCT 53.9 (*)    All other components within normal limits  GLUCOSE, CAPILLARY - Abnormal; Notable for the following components:   Glucose-Capillary 166 (*)    All other components within normal limits  SURGICAL PCR SCREEN  TYPE AND SCREEN     IMAGES:   EKG:   CV: Echo 05/22/2020  1. Left ventricular ejection fraction by 3D volume is 57 %. The left  ventricle has normal function. The left ventricle has no regional wall  motion abnormalities. Left ventricular diastolic parameters are consistent  with Grade I diastolic dysfunction  (impaired relaxation).   2. Right ventricular systolic function is normal. The right ventricular  size is normal. There is normal pulmonary artery systolic pressure. The  estimated right ventricular systolic pressure is 19.8 mmHg.   3. The mitral valve is normal in structure. Trivial mitral valve  regurgitation. No evidence of mitral stenosis.   4. The aortic valve is grossly normal. There is mild calcification of the  aortic valve. Aortic valve regurgitation is not visualized. No aortic  stenosis is present.   5. The inferior vena  cava is normal in size with greater than 50%  respiratory variability, suggesting right atrial pressure of 3 mmHg.   Past Medical History:  Diagnosis Date   Allergic rhinitis    Allergy     Arthritis    Asthma    Barrett's esophagus    Cataract    removed both eyes with lens replacement Dr Octavia   Complication of anesthesia    took them 6 hours to wake me up   Diabetes mellitus without complication (HCC)    GERD (gastroesophageal reflux disease)    Heart murmur    History of kidney stones    HTN (hypertension)     Hyperlipidemia    Pneumonia    as a kid    Past Surgical History:  Procedure Laterality Date   CATARACT EXTRACTION, BILATERAL     with lens implants    COLONOSCOPY  01/15/2009   normal    CORONARY ARTERY BYPASS GRAFT N/A 07/07/2019   Procedure: CORONARY ARTERY BYPASS GRAFTING (CABG) using LIMA to LAD; Free IMA to RCA.;  Surgeon: Dusty Sudie DEL, MD;  Location: [facility] OR;  Service: Open Heart Surgery;  Laterality: N/A;  BILATERAL IMA   ENDOVEIN HARVEST OF GREATER SAPHENOUS VEIN Right 07/07/2019   Procedure: Jethro Rubins Of Greater Saphenous Vein;  Surgeon: Dusty Sudie DEL, MD;  Location: [facility] OR;  Service: Open Heart Surgery;  Laterality: Right;   ESOPHAGOGASTRODUODENOSCOPY  03/20/2006   EYE SURGERY     KIDNEY STONE SURGERY     x 4 - most recent 04-2018 at [facility]    LEFT HEART CATH AND CORONARY ANGIOGRAPHY N/A 07/05/2019   Procedure: LEFT HEART CATH AND CORONARY ANGIOGRAPHY;  Surgeon: [place], Peter M, MD;  Location: [facility] INVASIVE CV LAB;  Service: Cardiovascular;  Laterality: N/A;   TEE WITHOUT CARDIOVERSION N/A 07/07/2019   Procedure: TRANSESOPHAGEAL ECHOCARDIOGRAM (TEE);  Surgeon: Dusty Sudie DEL, MD;  Location: [facility] OR;  Service: Open Heart Surgery;  Laterality: N/A;   TONSILLECTOMY     TOTAL HIP ARTHROPLASTY Right 04/30/2016   Procedure: RIGHT TOTAL HIP ARTHROPLASTY ANTERIOR APPROACH;  Surgeon: Dempsey Moan, MD;  Location: [facility];  Service: Orthopedics;  Laterality: Right;  requests   TRANSTHORACIC ECHOCARDIOGRAM  01/05/1998   UPPER GASTROINTESTINAL ENDOSCOPY      MEDICATIONS:  Ascorbic Acid (VITAMIN C WITH ROSE HIPS) 1000 MG tablet   aspirin  EC 81 MG EC tablet   Blood Glucose Monitoring Suppl (ONETOUCH VERIO) w/Device KIT   cetirizine  (ZYRTEC ) 10 MG tablet   Continuous Glucose Sensor (FREESTYLE LIBRE 3 PLUS SENSOR) MISC   ezetimibe  (ZETIA ) 10 MG tablet   fluticasone (CUTIVATE) 0.05 % cream   Glucagon  (GVOKE HYPOPEN  1-PACK) 1 MG/0.2ML SOAJ   glucose blood  (ONETOUCH VERIO) test strip   insulin  degludec (TRESIBA  FLEXTOUCH) 100 UNIT/ML FlexTouch Pen   insulin  lispro (HUMALOG  KWIKPEN) 100 UNIT/ML KwikPen   Insulin  Pen Needle 32G X 4 MM MISC   ketoconazole (NIZORAL) 2 % shampoo   L-CITRULLINE PO   LECITHIN PO   Magnesium  Bisglycinate (MAG GLYCINATE PO)   Melatonin 10 MG TABS   Menaquinone-7 (FT VITAMIN K2) 100 MCG CAPS   omeprazole (PRILOSEC OTC) 20 MG tablet   promethazine  (PHENERGAN ) 25 MG tablet   tadalafil  (CIALIS ) 5 MG tablet   thiamine (VITAMIN B-1) 100 MG tablet   triamcinolone cream (KENALOG) 0.1 %   valsartan  (DIOVAN ) 160 MG tablet   VITAMIN D  PO   zinc sulfate, 50mg  elemental zinc, 220 (50 Zn) MG capsule  No current facility-administered medications for this encounter.     Harlene Hoots Ward, PA-C [facility] [phone]

## 2023-08-31 ENCOUNTER — Encounter (HOSPITAL_COMMUNITY): Payer: Self-pay | Admitting: Orthopedic Surgery

## 2023-08-31 ENCOUNTER — Ambulatory Visit (HOSPITAL_COMMUNITY): Payer: Self-pay | Admitting: Physician Assistant

## 2023-08-31 ENCOUNTER — Encounter (HOSPITAL_COMMUNITY)
Admission: RE | Disposition: A | Discharge status: Home / Self Care | Payer: Self-pay | Source: Home / Self Care | Attending: Orthopedic Surgery

## 2023-08-31 ENCOUNTER — Other Ambulatory Visit: Payer: Self-pay

## 2023-08-31 ENCOUNTER — Observation Stay (HOSPITAL_COMMUNITY)
Admission: RE | Admit: 2023-08-31 | Discharge: 2023-09-01 | Disposition: A | Discharge status: Home / Self Care | Attending: Orthopedic Surgery | Admitting: Orthopedic Surgery

## 2023-08-31 ENCOUNTER — Ambulatory Visit (HOSPITAL_BASED_OUTPATIENT_CLINIC_OR_DEPARTMENT_OTHER): Admitting: Anesthesiology

## 2023-08-31 ENCOUNTER — Ambulatory Visit (HOSPITAL_COMMUNITY)

## 2023-08-31 DIAGNOSIS — M169 Osteoarthritis of hip, unspecified: Principal | ICD-10-CM | POA: Diagnosis present

## 2023-08-31 DIAGNOSIS — E119 Type 2 diabetes mellitus without complications: Secondary | ICD-10-CM | POA: Diagnosis not present

## 2023-08-31 DIAGNOSIS — J45909 Unspecified asthma, uncomplicated: Secondary | ICD-10-CM | POA: Diagnosis not present

## 2023-08-31 DIAGNOSIS — I252 Old myocardial infarction: Secondary | ICD-10-CM

## 2023-08-31 DIAGNOSIS — M1610 Unilateral primary osteoarthritis, unspecified hip: Principal | ICD-10-CM

## 2023-08-31 DIAGNOSIS — I10 Essential (primary) hypertension: Secondary | ICD-10-CM | POA: Insufficient documentation

## 2023-08-31 DIAGNOSIS — Z91010 Allergy to peanuts: Secondary | ICD-10-CM | POA: Insufficient documentation

## 2023-08-31 DIAGNOSIS — M1612 Unilateral primary osteoarthritis, left hip: Principal | ICD-10-CM | POA: Insufficient documentation

## 2023-08-31 DIAGNOSIS — Z794 Long term (current) use of insulin: Secondary | ICD-10-CM | POA: Insufficient documentation

## 2023-08-31 DIAGNOSIS — I2510 Atherosclerotic heart disease of native coronary artery without angina pectoris: Secondary | ICD-10-CM | POA: Diagnosis not present

## 2023-08-31 DIAGNOSIS — Z7982 Long term (current) use of aspirin: Secondary | ICD-10-CM | POA: Diagnosis not present

## 2023-08-31 HISTORY — PX: TOTAL HIP ARTHROPLASTY: SHX124

## 2023-08-31 LAB — GLUCOSE, CAPILLARY
Glucose-Capillary: 129 mg/dL — ABNORMAL HIGH (ref 70–99)
Glucose-Capillary: 121 mg/dL — ABNORMAL HIGH (ref 70–99)
Glucose-Capillary: 319 mg/dL — ABNORMAL HIGH (ref 70–99)
Glucose-Capillary: 205 mg/dL — ABNORMAL HIGH (ref 70–99)

## 2023-08-31 LAB — TYPE AND SCREEN
ABO/RH(D): B NEG
Antibody Screen: NEGATIVE

## 2023-08-31 SURGERY — ARTHROPLASTY, HIP, TOTAL, ANTERIOR APPROACH
Anesthesia: Spinal | Site: Hip | Laterality: Left

## 2023-08-31 MED ORDER — BUPIVACAINE-EPINEPHRINE (PF) 0.25% -1:200000 IJ SOLN
INTRAMUSCULAR | Status: DC | PRN
  Administered 2023-08-31: 30 mL

## 2023-08-31 MED ORDER — ONDANSETRON HCL 4 MG PO TABS: 4.0000 mg | ORAL_TABLET | Freq: Four times a day (QID) | ORAL | Status: DC | PRN

## 2023-08-31 MED ORDER — CHLORHEXIDINE GLUCONATE 0.12 % MT SOLN
15.0000 mL | Freq: Once | OROMUCOSAL | Status: AC
  Administered 2023-08-31: 15 mL via OROMUCOSAL

## 2023-08-31 MED ORDER — BUPIVACAINE-EPINEPHRINE (PF) 0.25% -1:200000 IJ SOLN
INTRAMUSCULAR | Status: AC
  Filled 2023-08-31: qty 30

## 2023-08-31 MED ORDER — MORPHINE SULFATE (PF) 2 MG/ML IV SOLN
0.5000 mg | INTRAVENOUS | Status: DC | PRN
  Administered 2023-08-31: 1 mg via INTRAVENOUS
  Filled 2023-08-31: qty 1

## 2023-08-31 MED ORDER — IRBESARTAN 150 MG PO TABS
150.0000 mg | ORAL_TABLET | Freq: Every day | ORAL | Status: DC
  Administered 2023-09-01: 150 mg via ORAL
  Filled 2023-08-31: qty 1

## 2023-08-31 MED ORDER — FENTANYL CITRATE PF 50 MCG/ML IJ SOSY: 25.0000 ug | PREFILLED_SYRINGE | INTRAMUSCULAR | Status: DC | PRN

## 2023-08-31 MED ORDER — DEXAMETHASONE SODIUM PHOSPHATE 10 MG/ML IJ SOLN
INTRAMUSCULAR | Status: AC
  Filled 2023-08-31: qty 1

## 2023-08-31 MED ORDER — ACETAMINOPHEN 500 MG PO TABS
500.0000 mg | ORAL_TABLET | Freq: Four times a day (QID) | ORAL | Status: DC
  Administered 2023-09-01: 500 mg via ORAL
  Filled 2023-08-31 (×2): qty 1

## 2023-08-31 MED ORDER — PROPOFOL 500 MG/50ML IV EMUL
INTRAVENOUS | Status: DC | PRN
  Administered 2023-08-31: 50 mg via INTRAVENOUS
  Administered 2023-08-31: 50 ug/kg/min via INTRAVENOUS

## 2023-08-31 MED ORDER — CEFAZOLIN SODIUM-DEXTROSE 2-4 GM/100ML-% IV SOLN
2.0000 g | Freq: Four times a day (QID) | INTRAVENOUS | Status: AC
  Administered 2023-08-31 – 2023-09-01 (×2): 2 g via INTRAVENOUS
  Filled 2023-08-31 (×2): qty 100

## 2023-08-31 MED ORDER — DOCUSATE SODIUM 100 MG PO CAPS
100.0000 mg | ORAL_CAPSULE | Freq: Two times a day (BID) | ORAL | Status: DC
  Administered 2023-08-31 – 2023-09-01 (×2): 100 mg via ORAL
  Filled 2023-08-31 (×2): qty 1

## 2023-08-31 MED ORDER — MIDAZOLAM HCL 2 MG/2ML IJ SOLN
INTRAMUSCULAR | Status: AC
  Filled 2023-08-31: qty 2

## 2023-08-31 MED ORDER — MAGNESIUM CITRATE PO SOLN: 1.0000 | Freq: Once | ORAL | Status: DC | PRN

## 2023-08-31 MED ORDER — MEPERIDINE HCL 50 MG/ML IJ SOLN
6.2500 mg | INTRAMUSCULAR | Status: DC | PRN
  Administered 2023-08-31: 12.5 mg via INTRAVENOUS

## 2023-08-31 MED ORDER — ONDANSETRON HCL 4 MG/2ML IJ SOLN: 4.0000 mg | Freq: Four times a day (QID) | INTRAMUSCULAR | Status: DC | PRN

## 2023-08-31 MED ORDER — MEPERIDINE HCL 50 MG/ML IJ SOLN
INTRAMUSCULAR | Status: AC
  Filled 2023-08-31: qty 1

## 2023-08-31 MED ORDER — PROPOFOL 1000 MG/100ML IV EMUL
INTRAVENOUS | Status: AC
  Filled 2023-08-31: qty 100

## 2023-08-31 MED ORDER — 0.9 % SODIUM CHLORIDE (POUR BTL) OPTIME
TOPICAL | Status: DC | PRN
  Administered 2023-08-31: 1000 mL

## 2023-08-31 MED ORDER — AMISULPRIDE (ANTIEMETIC) 5 MG/2ML IV SOLN: 10.0000 mg | Freq: Once | INTRAVENOUS | Status: DC | PRN

## 2023-08-31 MED ORDER — LACTATED RINGERS IV SOLN: INTRAVENOUS | Status: DC

## 2023-08-31 MED ORDER — SODIUM CHLORIDE 0.9 % IV SOLN: INTRAVENOUS | Status: DC

## 2023-08-31 MED ORDER — INSULIN DEGLUDEC 100 UNIT/ML ~~LOC~~ SOPN: 16.0000 [IU] | PEN_INJECTOR | Freq: Every day | SUBCUTANEOUS | Status: DC

## 2023-08-31 MED ORDER — ONDANSETRON HCL 4 MG/2ML IJ SOLN
INTRAMUSCULAR | Status: AC
  Filled 2023-08-31: qty 2

## 2023-08-31 MED ORDER — RIVAROXABAN 10 MG PO TABS
10.0000 mg | ORAL_TABLET | Freq: Every day | ORAL | Status: DC
  Administered 2023-09-01: 10 mg via ORAL
  Filled 2023-08-31: qty 1

## 2023-08-31 MED ORDER — INSULIN GLARGINE-YFGN 100 UNIT/ML ~~LOC~~ SOLN
16.0000 [IU] | Freq: Every day | SUBCUTANEOUS | Status: DC
  Administered 2023-09-01: 16 [IU] via SUBCUTANEOUS
  Filled 2023-08-31: qty 0.16

## 2023-08-31 MED ORDER — BUPIVACAINE IN DEXTROSE 0.75-8.25 % IT SOLN
INTRATHECAL | Status: DC | PRN
  Administered 2023-08-31: 2 mL via INTRATHECAL

## 2023-08-31 MED ORDER — POVIDONE-IODINE 10 % EX SWAB: 2.0000 | Freq: Once | CUTANEOUS | Status: DC

## 2023-08-31 MED ORDER — MENTHOL 3 MG MT LOZG: 1.0000 | LOZENGE | OROMUCOSAL | Status: DC | PRN

## 2023-08-31 MED ORDER — EZETIMIBE 10 MG PO TABS
10.0000 mg | ORAL_TABLET | Freq: Every day | ORAL | Status: DC
Start: 2023-09-01 — End: 2023-09-01
  Administered 2023-09-01: 10 mg via ORAL
  Filled 2023-08-31: qty 1

## 2023-08-31 MED ORDER — PHENOL 1.4 % MT LIQD: 1.0000 | OROMUCOSAL | Status: DC | PRN

## 2023-08-31 MED ORDER — INSULIN ASPART 100 UNIT/ML IJ SOLN
0.0000 [IU] | Freq: Three times a day (TID) | INTRAMUSCULAR | Status: DC
  Administered 2023-08-31: 11 [IU] via SUBCUTANEOUS
  Administered 2023-09-01: 3 [IU] via SUBCUTANEOUS
  Administered 2023-09-01: 2 [IU] via SUBCUTANEOUS

## 2023-08-31 MED ORDER — CEFAZOLIN SODIUM-DEXTROSE 2-4 GM/100ML-% IV SOLN
2.0000 g | INTRAVENOUS | Status: AC
Start: 2023-08-31 — End: 2023-08-31
  Administered 2023-08-31: 2 g via INTRAVENOUS
  Filled 2023-08-31: qty 100

## 2023-08-31 MED ORDER — INSULIN ASPART 100 UNIT/ML IJ SOLN: 0.0000 [IU] | INTRAMUSCULAR | Status: DC | PRN

## 2023-08-31 MED ORDER — METHOCARBAMOL 1000 MG/10ML IJ SOLN: 500.0000 mg | Freq: Four times a day (QID) | INTRAMUSCULAR | Status: DC | PRN

## 2023-08-31 MED ORDER — ACETAMINOPHEN 10 MG/ML IV SOLN
1000.0000 mg | Freq: Four times a day (QID) | INTRAVENOUS | Status: DC
  Administered 2023-08-31: 1000 mg via INTRAVENOUS
  Filled 2023-08-31: qty 100

## 2023-08-31 MED ORDER — ACETAMINOPHEN 325 MG PO TABS: 325.0000 mg | ORAL_TABLET | Freq: Four times a day (QID) | ORAL | Status: DC | PRN

## 2023-08-31 MED ORDER — PROPOFOL 10 MG/ML IV BOLUS
INTRAVENOUS | Status: AC
  Filled 2023-08-31: qty 20

## 2023-08-31 MED ORDER — METHOCARBAMOL 500 MG PO TABS
500.0000 mg | ORAL_TABLET | Freq: Four times a day (QID) | ORAL | Status: DC | PRN
  Administered 2023-09-01: 500 mg via ORAL
  Filled 2023-08-31: qty 1

## 2023-08-31 MED ORDER — HYDROCODONE-ACETAMINOPHEN 7.5-325 MG PO TABS
1.0000 | ORAL_TABLET | ORAL | Status: DC | PRN
  Administered 2023-09-01 (×2): 2 via ORAL
  Filled 2023-08-31 (×2): qty 2

## 2023-08-31 MED ORDER — DEXAMETHASONE SODIUM PHOSPHATE 10 MG/ML IJ SOLN
8.0000 mg | Freq: Once | INTRAMUSCULAR | Status: AC
  Administered 2023-08-31: 10 mg via INTRAVENOUS

## 2023-08-31 MED ORDER — INSULIN ASPART 100 UNIT/ML IJ SOLN: 0.0000 [IU] | Freq: Three times a day (TID) | INTRAMUSCULAR | Status: DC

## 2023-08-31 MED ORDER — HYDROCODONE-ACETAMINOPHEN 5-325 MG PO TABS
1.0000 | ORAL_TABLET | ORAL | Status: DC | PRN
  Administered 2023-08-31 – 2023-09-01 (×3): 2 via ORAL
  Filled 2023-08-31 (×5): qty 2

## 2023-08-31 MED ORDER — INSULIN ASPART 100 UNIT/ML IJ SOLN
4.0000 [IU] | Freq: Three times a day (TID) | INTRAMUSCULAR | Status: DC
  Administered 2023-09-01 (×2): 4 [IU] via SUBCUTANEOUS

## 2023-08-31 MED ORDER — WATER FOR IRRIGATION, STERILE IR SOLN
Status: DC | PRN
  Administered 2023-08-31: 1000 mL

## 2023-08-31 MED ORDER — INSULIN LISPRO (1 UNIT DIAL) 100 UNIT/ML (KWIKPEN): 4.0000 [IU] | PEN_INJECTOR | Freq: Three times a day (TID) | SUBCUTANEOUS | Status: DC

## 2023-08-31 MED ORDER — MIDAZOLAM HCL 5 MG/5ML IJ SOLN
INTRAMUSCULAR | Status: DC | PRN
  Administered 2023-08-31: 2 mg via INTRAVENOUS

## 2023-08-31 MED ORDER — BISACODYL 10 MG RE SUPP: 10.0000 mg | Freq: Every day | RECTAL | Status: DC | PRN

## 2023-08-31 MED ORDER — ORAL CARE MOUTH RINSE: 15.0000 mL | Freq: Once | OROMUCOSAL | Status: AC

## 2023-08-31 MED ORDER — METOCLOPRAMIDE HCL 5 MG/ML IJ SOLN: 5.0000 mg | Freq: Three times a day (TID) | INTRAMUSCULAR | Status: DC | PRN

## 2023-08-31 MED ORDER — TRANEXAMIC ACID-NACL 1000-0.7 MG/100ML-% IV SOLN
1000.0000 mg | INTRAVENOUS | Status: AC
  Administered 2023-08-31: 1000 mg via INTRAVENOUS
  Filled 2023-08-31: qty 100

## 2023-08-31 MED ORDER — POLYETHYLENE GLYCOL 3350 17 G PO PACK: 17.0000 g | PACK | Freq: Every day | ORAL | Status: DC | PRN

## 2023-08-31 MED ORDER — METOCLOPRAMIDE HCL 5 MG PO TABS: 5.0000 mg | ORAL_TABLET | Freq: Three times a day (TID) | ORAL | Status: DC | PRN

## 2023-08-31 MED ORDER — LORATADINE 10 MG PO TABS
10.0000 mg | ORAL_TABLET | Freq: Every day | ORAL | Status: DC
  Administered 2023-09-01: 10 mg via ORAL
  Filled 2023-08-31: qty 1

## 2023-08-31 MED ORDER — ONDANSETRON HCL 4 MG/2ML IJ SOLN
INTRAMUSCULAR | Status: DC | PRN
  Administered 2023-08-31: 4 mg via INTRAVENOUS

## 2023-08-31 SURGICAL SUPPLY — 36 items
BAG COUNTER SPONGE SURGICOUNT (BAG) IMPLANT
BAG ZIPLOCK 12X15 (MISCELLANEOUS) IMPLANT
BLADE SAG 18X100X1.27 (BLADE) ×1 IMPLANT
COVER PERINEAL POST (MISCELLANEOUS) ×1 IMPLANT
COVER SURGICAL LIGHT HANDLE (MISCELLANEOUS) ×1 IMPLANT
CUP ACET PINNACLE SECTR 56MM (Hips) IMPLANT
DERMABOND ADVANCED .7 DNX12 (GAUZE/BANDAGES/DRESSINGS) ×1 IMPLANT
DRAPE FOOT SWITCH (DRAPES) ×1 IMPLANT
DRAPE STERI IOBAN 125X83 (DRAPES) ×1 IMPLANT
DRAPE U-SHAPE 47X51 STRL (DRAPES) ×2 IMPLANT
DRESSING AQUACEL AG SP 3.5X10 (GAUZE/BANDAGES/DRESSINGS) IMPLANT
DRSG AQUACEL AG ADV 3.5X10 (GAUZE/BANDAGES/DRESSINGS) ×1 IMPLANT
DURAPREP 26ML APPLICATOR (WOUND CARE) ×1 IMPLANT
ELECT REM PT RETURN 15FT ADLT (MISCELLANEOUS) ×1 IMPLANT
GLOVE BIO SURGEON STRL SZ 6.5 (GLOVE) IMPLANT
GLOVE BIO SURGEON STRL SZ7 (GLOVE) IMPLANT
GLOVE BIO SURGEON STRL SZ8 (GLOVE) ×1 IMPLANT
GLOVE BIOGEL PI IND STRL 7.0 (GLOVE) IMPLANT
GLOVE BIOGEL PI IND STRL 8 (GLOVE) ×1 IMPLANT
GOWN STRL REUS W/ TWL LRG LVL3 (GOWN DISPOSABLE) ×2 IMPLANT
HEAD CERAMIC 36 PLUS5 (Hips) IMPLANT
HOLDER FOLEY CATH W/STRAP (MISCELLANEOUS) ×1 IMPLANT
KIT TURNOVER KIT A (KITS) ×1 IMPLANT
MANIFOLD NEPTUNE II (INSTRUMENTS) ×1 IMPLANT
PACK ANTERIOR HIP CUSTOM (KITS) ×1 IMPLANT
PENCIL SMOKE EVACUATOR COATED (MISCELLANEOUS) ×1 IMPLANT
PINNACLE ALTRX PLUS 4 N 36X56 (Hips) IMPLANT
SPIKE FLUID TRANSFER (MISCELLANEOUS) ×1 IMPLANT
STEM FEMORAL SZ8 STD ACTIS (Stem) IMPLANT
SUT ETHIBOND NAB CT1 #1 30IN (SUTURE) ×1 IMPLANT
SUT MNCRL AB 4-0 PS2 18 (SUTURE) ×1 IMPLANT
SUT VIC AB 2-0 CT1 TAPERPNT 27 (SUTURE) ×2 IMPLANT
SUTURE STRATFX 0 PDS 27 VIOLET (SUTURE) ×1 IMPLANT
TOWEL GREEN STERILE FF (TOWEL DISPOSABLE) ×1 IMPLANT
TRAY FOLEY MTR SLVR 16FR STAT (SET/KITS/TRAYS/PACK) ×1 IMPLANT
TUBE SUCTION HIGH CAP CLEAR NV (SUCTIONS) ×1 IMPLANT

## 2023-08-31 NOTE — Progress Notes (Signed)
 Pt took long acting insulin  this AM prior to arrival. Proceed with placing patient on peri-op sensitive scale per Dr. FABIENE Needle.

## 2023-08-31 NOTE — Interval H&P Note (Signed)
 History and Physical Interval Note:  08/31/2023 10:12 AM  Corey [name]  has presented today for surgery, with the diagnosis of Left hip osteoarthritis.  The various methods of treatment have been discussed with the patient and family. After consideration of risks, benefits and other options for treatment, the patient has consented to  Procedure(s): ARTHROPLASTY, HIP, TOTAL, ANTERIOR APPROACH (Left) as a surgical intervention.  The patient's history has been reviewed, patient examined, no change in status, stable for surgery.  I have reviewed the patient's chart and labs.  Questions were answered to the patient's satisfaction.     Dempsey [name]

## 2023-08-31 NOTE — Anesthesia Postprocedure Evaluation (Signed)
 Anesthesia Post Note  Patient: Corey [name]  Procedure(s) Performed: ARTHROPLASTY, HIP, TOTAL, ANTERIOR APPROACH (Left: Hip)     Patient location during evaluation: PACU Anesthesia Type: Spinal Level of consciousness: awake and alert Pain management: pain level controlled Vital Signs Assessment: post-procedure vital signs reviewed and stable Respiratory status: spontaneous breathing and respiratory function stable Cardiovascular status: blood pressure returned to baseline and stable Postop Assessment: spinal receding Anesthetic complications: no   No notable events documented.  Last Vitals:  Vitals:   08/31/23 1638 08/31/23 1741  BP: (!) 153/95 (!) 163/88  Pulse:  93  Resp: 18 18  Temp: 36.7 C   SpO2: 99% 99%    Last Pain:  Vitals:   08/31/23 2005  TempSrc:   PainSc: 4                  Epifanio Lamar BRAVO

## 2023-08-31 NOTE — Transfer of Care (Signed)
 Immediate Anesthesia Transfer of Care Note  Patient: Corey [name]  Procedure(s) Performed: ARTHROPLASTY, HIP, TOTAL, ANTERIOR APPROACH (Left: Hip)  Patient Location: PACU  Anesthesia Type:MAC  Level of Consciousness: awake, alert , oriented, and patient cooperative  Airway & Oxygen Therapy: Patient Spontanous Breathing  Post-op Assessment: Report given to RN and Post -op Vital signs reviewed and stable  Post vital signs: Reviewed and stable  Last Vitals:  Vitals Value Taken Time  BP 132/82 08/31/23 14:00  Temp 97   Pulse 87 08/31/23 14:01  Resp 9 08/31/23 14:01  SpO2 96 % 08/31/23 14:01  Vitals shown include unfiled device data.  Last Pain:  Vitals:   08/31/23 1026  TempSrc:   PainSc: 6          Complications: No notable events documented.

## 2023-08-31 NOTE — Op Note (Signed)
 OPERATIVE REPORT- TOTAL HIP ARTHROPLASTY   PREOPERATIVE DIAGNOSIS: Osteonecrosis of the Left hip.   POSTOPERATIVE DIAGNOSIS: Osteonecrosis of the Left  hip.   PROCEDURE: Left total hip arthroplasty, anterior approach.   SURGEON: Dempsey Moan, MD   ASSISTANT: Zelda Kobs, PA-C  ANESTHESIA:  Spinal  ESTIMATED BLOOD LOSS:-300 mL    DRAINS: None  COMPLICATIONS: None   CONDITION: PACU - hemodynamically stable.   BRIEF CLINICAL NOTE: Corey [name] is a 73 y.o. male who has advanced osteonecrosis  of their Left  hip with progressively worsening pain and  dysfunction.The patient has failed nonoperative management and presents for  total hip arthroplasty.   PROCEDURE IN DETAIL: After successful administration of spinal  anesthetic, the traction boots for the [facility] bed were placed on both  feet and the patient was placed onto the [facility] bed, boots placed into the leg  holders. The Left hip was then isolated from the perineum with plastic  drapes and prepped and draped in the usual sterile fashion. ASIS and  greater trochanter were marked and a oblique incision was made, starting  at about 1 cm lateral and 2 cm distal to the ASIS and coursing towards  the anterior cortex of the femur. The skin was cut with a 10 blade  through subcutaneous tissue to the level of the fascia overlying the  tensor fascia lata muscle. The fascia was then incised in line with the  incision at the junction of the anterior third and posterior 2/3rd. The  muscle was teased off the fascia and then the interval between the TFL  and the rectus was developed. The Hohmann retractor was then placed at  the top of the femoral neck over the capsule. The vessels overlying the  capsule were cauterized and the fat on top of the capsule was removed.  A Hohmann retractor was then placed anterior underneath the rectus  femoris to give exposure to the entire anterior capsule. A T-shaped  capsulotomy was  performed. The edges were tagged and the femoral head  was identified.       Osteophytes are removed off the superior acetabulum.  The femoral neck was then cut in situ with an oscillating saw. Traction  was then applied to the left lower extremity utilizing the [facility]  traction. The femoral head was then removed. Retractors were placed  around the acetabulum and then circumferential removal of the labrum was  performed. Osteophytes were also removed. Reaming starts at 51 mm to  medialize and  Increased in 2 mm increments to 55 mm. We reamed in  approximately 40 degrees of abduction, 20 degrees anteversion. A 56 mm  pinnacle acetabular shell was then impacted in anatomic position under  fluoroscopic guidance with excellent purchase. We did not need to place  any additional dome screws. A 36 mm neutral + 4 marathon liner was then  placed into the acetabular shell.       The femoral lift was then placed along the lateral aspect of the femur  just distal to the vastus ridge. The leg was  externally rotated and capsule  was stripped off the inferior aspect of the femoral neck down to the  level of the lesser trochanter, this was done with electrocautery. The femur was lifted after this was performed. The  leg was then placed in an extended and adducted position essentially delivering the femur. We also removed the capsule superiorly and the piriformis from the piriformis fossa to gain excellent  exposure of the  proximal femur. Rongeur was used to remove some cancellous bone to get  into the lateral portion of the proximal femur for placement of the  initial starter reamer. The starter broaches was placed  the starter broach  and was shown to go down the center of the canal. Broaching  with the Actis system was then performed starting at size 0  coursing  Up to size 8. A size 8 had excellent torsional and rotational  and axial stability. The trial standard offset neck was then placed  with a 36 + 5  trial head. The hip was then reduced. We confirmed that  the stem was in the canal both on AP and lateral x-rays. It also has excellent sizing. The hip was reduced with outstanding stability through full extension and full external rotation.. AP pelvis was taken and the leg lengths were measured and found to be equal. Hip was then dislocated again and the femoral head and neck removed. The  femoral broach was removed. Size 8 Actis stem with a standard offset  neck was then impacted into the femur following native anteversion. Has  excellent purchase in the canal. Excellent torsional and rotational and  axial stability. It is confirmed to be in the canal on AP and lateral  fluoroscopic views. The 36 + 5 ceramic head was placed and the hip  reduced with outstanding stability. Again AP pelvis was taken and it  confirmed that the leg lengths were equal. The wound was then copiously  irrigated with saline solution and the capsule reattached and repaired  with Ethibond suture. 30 ml of .25% Bupivicaine was  injected into the capsule and into the edge of the tensor fascia lata as well as subcutaneous tissue. The fascia overlying the tensor fascia lata was then closed with a running #1 V-Loc. Subcu was closed with interrupted 2-0 Vicryl and subcuticular running 4-0 Monocryl. Incision was cleaned  and dried. Steri-Strips and a bulky sterile dressing applied. The patient was awakened and transported to  recovery in stable condition.        Please note that a surgical assistant was a medical necessity for this procedure to perform it in a safe and expeditious manner. Assistant was necessary to provide appropriate retraction of vital neurovascular structures and to prevent femoral fracture and allow for anatomic placement of the prosthesis.  Dempsey Moan, M.D.

## 2023-08-31 NOTE — Plan of Care (Signed)
  Problem: Activity: Goal: Risk for activity intolerance will decrease Outcome: Progressing   Problem: Pain Managment: Goal: General experience of comfort will improve and/or be controlled Outcome: Progressing

## 2023-08-31 NOTE — Discharge Instructions (Signed)
 Dempsey Moan, MD Total Joint Specialist [facility] [address], [address] [place], [place] [address] [phone]  ANTERIOR APPROACH TOTAL HIP REPLACEMENT POSTOPERATIVE DIRECTIONS     Hip Rehabilitation, Guidelines Following Surgery  The results of a hip operation are greatly improved after range of motion and muscle strengthening exercises. Follow all safety measures which are given to protect your hip. If any of these exercises cause increased pain or swelling in your joint, decrease the amount until you are comfortable again. Then slowly increase the exercises. Call your caregiver if you have problems or questions.   BLOOD CLOT PREVENTION Take a 10 mg Xarelto  once a day for three weeks following surgery. Then take an 81 mg Aspirin  once a day for three weeks. Then discontinue Aspirin . You may resume your vitamins/supplements once you have discontinued the Xarelto . Do not take any NSAIDs (Advil, Aleve, Ibuprofen, Meloxicam , etc.) until you have discontinued the Xarelto .   HOME CARE INSTRUCTIONS  Remove items at home which could result in a fall. This includes throw rugs or furniture in walking pathways.  ICE to the affected hip as frequently as 20-30 minutes an hour and then as needed for pain and swelling. Continue to use ice on the hip for pain and swelling from surgery. You may notice swelling that will progress down to the foot and ankle. This is normal after surgery. Elevate the leg when you are not up walking on it.   Continue to use the breathing machine which will help keep your temperature down.  It is common for your temperature to cycle up and down following surgery, especially at night when you are not up moving around and exerting yourself.  The breathing machine keeps your lungs expanded and your temperature down.  DIET You may resume your previous home diet once your are discharged from the hospital.  DRESSING / WOUND CARE / SHOWERING You have an  adhesive waterproof bandage over the incision. Leave this in place until your first follow-up appointment. Once you remove this you will not need to place another bandage.  You may begin showering 3 days following surgery, but do not submerge the incision under water .  ACTIVITY For the first 3-5 days, it is important to rest and keep the operative leg elevated. You should, as a general rule, rest for 50 minutes and walk/stretch for 10 minutes per hour. After 5 days, you may slowly increase activity as tolerated.  Perform the exercises you were provided twice a day for about 15-20 minutes each session. Begin these 2 days following surgery. Walk with your walker as instructed. Use the walker until you are comfortable transitioning to a cane. Walk with the cane in the opposite hand of the operative leg. You may discontinue the cane once you are comfortable and walking steadily. Avoid periods of inactivity such as sitting longer than an hour when not asleep. This helps prevent blood clots.  Do not drive a car for 6 weeks or until released by your surgeon.  Do not drive while taking narcotics.  TED HOSE STOCKINGS Wear the elastic stockings on both legs for three weeks following surgery during the day. You may remove them at night while sleeping.  WEIGHT BEARING Weight bearing as tolerated with assist device (walker, cane, etc) as directed, use it as long as suggested by your surgeon or therapist, typically at least 4-6 weeks.  POSTOPERATIVE CONSTIPATION PROTOCOL Constipation - defined medically as fewer than three stools per week and severe constipation as less  than one stool per week.  One of the most common issues patients have following surgery is constipation.  Even if you have a regular bowel pattern at home, your normal regimen is likely to be disrupted due to multiple reasons following surgery.  Combination of anesthesia, postoperative narcotics, change in appetite and fluid intake all can  affect your bowels.  In order to avoid complications following surgery, here are some recommendations in order to help you during your recovery period.  Colace (docusate) - Pick up an over-the-counter form of Colace or another stool softener and take twice a day as long as you are requiring postoperative pain medications.  Take with a full glass of water  daily.  If you experience loose stools or diarrhea, hold the colace until you stool forms back up.  If your symptoms do not get better within 1 week or if they get worse, check with your doctor. Dulcolax (bisacodyl ) - Pick up over-the-counter and take as directed by the product packaging as needed to assist with the movement of your bowels.  Take with a full glass of water .  Use this product as needed if not relieved by Colace only.  MiraLax  (polyethylene glycol) - Pick up over-the-counter to have on hand.  MiraLax  is a solution that will increase the amount of water  in your bowels to assist with bowel movements.  Take as directed and can mix with a glass of water , juice, soda, coffee, or tea.  Take if you go more than two days without a movement.Do not use MiraLax  more than once per day. Call your doctor if you are still constipated or irregular after using this medication for 7 days in a row.  If you continue to have problems with postoperative constipation, please contact the office for further assistance and recommendations.  If you experience the worst abdominal pain ever or develop nausea or vomiting, please contact the office immediatly for further recommendations for treatment.  ITCHING  If you experience itching with your medications, try taking only a single pain pill, or even half a pain pill at a time.  You can also use Benadryl  over the counter for itching or also to help with sleep.   MEDICATIONS See your medication summary on the "After Visit Summary" that the nursing staff will review with you prior to discharge.  You may have some home  medications which will be placed on hold until you complete the course of blood thinner medication.  It is important for you to complete the blood thinner medication as prescribed by your surgeon.  Continue your approved medications as instructed at time of discharge.  PRECAUTIONS If you experience chest pain or shortness of breath - call 911 immediately for transfer to the hospital emergency department.  If you develop a fever greater that 101 F, purulent drainage from wound, increased redness or drainage from wound, foul odor from the wound/dressing, or calf pain - CONTACT YOUR SURGEON.                                                   FOLLOW-UP APPOINTMENTS Make sure you keep all of your appointments after your operation with your surgeon and caregivers. You should call the office at the above phone number and make an appointment for approximately two weeks after the date of your surgery or on the date  instructed by your surgeon outlined in the After Visit Summary.  RANGE OF MOTION AND STRENGTHENING EXERCISES  These exercises are designed to help you keep full movement of your hip joint. Follow your caregiver's or physical therapist's instructions. Perform all exercises about fifteen times, three times per day or as directed. Exercise both hips, even if you have had only one joint replacement. These exercises can be done on a training (exercise) mat, on the floor, on a table or on a bed. Use whatever works the best and is most comfortable for you. Use music or television while you are exercising so that the exercises are a pleasant break in your day. This will make your life better with the exercises acting as a break in routine you can look forward to.  Lying on your back, slowly slide your foot toward your buttocks, raising your knee up off the floor. Then slowly slide your foot back down until your leg is straight again.  Lying on your back spread your legs as far apart as you can without causing  discomfort.  Lying on your side, raise your upper leg and foot straight up from the floor as far as is comfortable. Slowly lower the leg and repeat.  Lying on your back, tighten up the muscle in the front of your thigh (quadriceps muscles). You can do this by keeping your leg straight and trying to raise your heel off the floor. This helps strengthen the largest muscle supporting your knee.  Lying on your back, tighten up the muscles of your buttocks both with the legs straight and with the knee bent at a comfortable angle while keeping your heel on the floor.   POST-OPERATIVE OPIOID TAPER INSTRUCTIONS: It is important to wean off of your opioid medication as soon as possible. If you do not need pain medication after your surgery it is ok to stop day one. Opioids include: Codeine, Hydrocodone (Norco, Vicodin), Oxycodone (Percocet, oxycontin ) and hydromorphone  amongst others.  Long term and even short term use of opiods can cause: Increased pain response Dependence Constipation Depression Respiratory depression And more.  Withdrawal symptoms can include Flu like symptoms Nausea, vomiting And more Techniques to manage these symptoms Hydrate well Eat regular healthy meals Stay active Use relaxation techniques(deep breathing, meditating, yoga) Do Not substitute Alcohol to help with tapering If you have been on opioids for less than two weeks and do not have pain than it is ok to stop all together.  Plan to wean off of opioids This plan should start within one week post op of your joint replacement. Maintain the same interval or time between taking each dose and first decrease the dose.  Cut the total daily intake of opioids by one tablet each day Next start to increase the time between doses. The last dose that should be eliminated is the evening dose.   IF YOU ARE TRANSFERRED TO A SKILLED REHAB FACILITY If the patient is transferred to a skilled rehab facility following release from the  hospital, a list of the current medications will be sent to the facility for the patient to continue.  When discharged from the skilled rehab facility, please have the facility set up the patient's Home Health Physical Therapy prior to being released. Also, the skilled facility will be responsible for providing the patient with their medications at time of release from the facility to include their pain medication, the muscle relaxants, and their blood thinner medication. If the patient is still at the rehab facility at  time of the two week follow up appointment, the skilled rehab facility will also need to assist the patient in arranging follow up appointment in our office and any transportation needs.  MAKE SURE YOU:  Understand these instructions.  Get help right away if you are not doing well or get worse.    DENTAL ANTIBIOTICS:  In most cases prophylactic antibiotics for Dental procdeures after total joint surgery are not necessary.  Exceptions are as follows:  1. History of prior total joint infection  2. Severely immunocompromised (Organ Transplant, cancer chemotherapy, Rheumatoid biologic meds such as Humera)  3. Poorly controlled diabetes (A1C &gt; 8.0, blood glucose over 200)  If you have one of these conditions, contact your surgeon for an antibiotic prescription, prior to your dental procedure.    Pick up stool softner and laxative for home use following surgery while on pain medications. Do not submerge incision under water . Please use good hand washing techniques while changing dressing each day. May shower starting three days after surgery. Please use a clean towel to pat the incision dry following showers. Continue to use ice for pain and swelling after surgery. Do not use any lotions or creams on the incision until instructed by your surgeon.   ++++++++++++++++++++++++++++++++++ Information on my medicine - XARELTO  (Rivaroxaban )  This medication education was  reviewed with me or my healthcare representative as part of my discharge preparation.  The pharmacist that spoke with me during my hospital stay was:  Eleanor Agent, [facility]  Why was Xarelto  prescribed for you? Xarelto  was prescribed for you to reduce the risk of blood clots forming after orthopedic surgery. The medical term for these abnormal blood clots is venous thromboembolism (VTE).  What do you need to know about xarelto  ? Take your Xarelto  ONCE DAILY at the same time every day. You may take it either with or without food.  If you have difficulty swallowing the tablet whole, you may crush it and mix in applesauce just prior to taking your dose.  Take Xarelto  exactly as prescribed by your doctor and DO NOT stop taking Xarelto  without talking to the doctor who prescribed the medication.  Stopping without other VTE prevention medication to take the place of Xarelto  may increase your risk of developing a clot.  After discharge, you should have regular check-up appointments with your healthcare provider that is prescribing your Xarelto .    What do you do if you miss a dose? If you miss a dose, take it as soon as you remember on the same day then continue your regularly scheduled once daily regimen the next day. Do not take two doses of Xarelto  on the same day.   Important Safety Information A possible side effect of Xarelto  is bleeding. You should call your healthcare provider right away if you experience any of the following: Bleeding from an injury or your nose that does not stop. Unusual colored urine (red or dark brown) or unusual colored stools (red or black). Unusual bruising for unknown reasons. A serious fall or if you hit your head (even if there is no bleeding).  Some medicines may interact with Xarelto  and might increase your risk of bleeding while on Xarelto . To help avoid this, consult your healthcare provider or pharmacist prior to using any new prescription or  non-prescription medications, including herbals, vitamins, non-steroidal anti-inflammatory drugs (NSAIDs) and supplements.  This website has more information on Xarelto : www.xarelto .com.

## 2023-08-31 NOTE — Plan of Care (Signed)
  Problem: Education: Goal: Knowledge of General Education information will improve Description: Including pain rating scale, medication(s)/side effects and non-pharmacologic comfort measures Outcome: Progressing   Problem: Health Behavior/Discharge Planning: Goal: Ability to manage health-related needs will improve Outcome: Progressing   Problem: Clinical Measurements: Goal: Ability to maintain clinical measurements within normal limits will improve Outcome: Progressing Goal: Will remain free from infection Outcome: Progressing Goal: Diagnostic test results will improve Outcome: Progressing Goal: Respiratory complications will improve Outcome: Progressing Goal: Cardiovascular complication will be avoided Outcome: Progressing   Problem: Activity: Goal: Risk for activity intolerance will decrease Outcome: Progressing   Problem: Nutrition: Goal: Adequate nutrition will be maintained Outcome: Progressing   Problem: Coping: Goal: Level of anxiety will decrease Outcome: Progressing   Problem: Elimination: Goal: Will not experience complications related to bowel motility Outcome: Progressing Goal: Will not experience complications related to urinary retention Outcome: Progressing   Problem: Pain Managment: Goal: General experience of comfort will improve and/or be controlled Outcome: Progressing   Problem: Safety: Goal: Ability to remain free from injury will improve Outcome: Progressing   Problem: Skin Integrity: Goal: Risk for impaired skin integrity will decrease Outcome: Progressing   Problem: Education: Goal: Ability to describe self-care measures that may prevent or decrease complications (Diabetes Survival Skills Education) will improve Outcome: Progressing Goal: Individualized Educational Video(s) Outcome: Progressing   Problem: Coping: Goal: Ability to adjust to condition or change in health will improve Outcome: Progressing   Problem: Fluid  Volume: Goal: Ability to maintain a balanced intake and output will improve Outcome: Progressing   Problem: Health Behavior/Discharge Planning: Goal: Ability to identify and utilize available resources and services will improve Outcome: Progressing Goal: Ability to manage health-related needs will improve Outcome: Progressing   Problem: Metabolic: Goal: Ability to maintain appropriate glucose levels will improve Outcome: Progressing   Problem: Nutritional: Goal: Maintenance of adequate nutrition will improve Outcome: Progressing Goal: Progress toward achieving an optimal weight will improve Outcome: Progressing   Problem: Skin Integrity: Goal: Risk for impaired skin integrity will decrease Outcome: Progressing   Problem: Tissue Perfusion: Goal: Adequacy of tissue perfusion will improve Outcome: Progressing   Problem: Education: Goal: Knowledge of the prescribed therapeutic regimen will improve Outcome: Progressing Goal: Understanding of discharge needs will improve Outcome: Progressing Goal: Individualized Educational Video(s) Outcome: Progressing   Problem: Activity: Goal: Ability to avoid complications of mobility impairment will improve Outcome: Progressing Goal: Ability to tolerate increased activity will improve Outcome: Progressing   Problem: Clinical Measurements: Goal: Postoperative complications will be avoided or minimized Outcome: Progressing   Problem: Pain Management: Goal: Pain level will decrease with appropriate interventions Outcome: Progressing   Problem: Skin Integrity: Goal: Will show signs of wound healing Outcome: Progressing

## 2023-08-31 NOTE — Anesthesia Procedure Notes (Signed)
 Procedure Name: MAC Date/Time: 08/31/2023 12:30 PM  Performed by: Nada Corean CROME, CRNAPre-anesthesia Checklist: Patient identified, Emergency Drugs available, Suction available and Timeout performed Patient Re-evaluated:Patient Re-evaluated prior to induction Oxygen Delivery Method: Simple face mask Preoxygenation: Pre-oxygenation with 100% oxygen Induction Type: IV induction Placement Confirmation: positive ETCO2

## 2023-08-31 NOTE — Anesthesia Procedure Notes (Signed)
 Spinal  Patient location during procedure: OR Start time: 08/31/2023 12:27 PM End time: 08/31/2023 12:32 PM Reason for block: surgical anesthesia Staffing Performed: anesthesiologist  Anesthesiologist: Epifanio Charleston, MD Performed by: Epifanio Charleston, MD Authorized by: Epifanio Charleston, MD   Preanesthetic Checklist Completed: patient identified, IV checked, site marked, risks and benefits discussed, surgical consent, monitors and equipment checked, pre-op evaluation and timeout performed Spinal Block Patient position: sitting Prep: DuraPrep Patient monitoring: heart rate, cardiac monitor, continuous pulse ox and blood pressure Approach: midline Location: L3-4 Injection technique: single-shot Needle Needle type: Pencan  Needle gauge: 24 G Needle length: 9 cm Assessment Sensory level: T4 Events: CSF return

## 2023-09-01 ENCOUNTER — Other Ambulatory Visit (HOSPITAL_COMMUNITY): Payer: Self-pay

## 2023-09-01 ENCOUNTER — Encounter (HOSPITAL_COMMUNITY): Payer: Self-pay | Admitting: Orthopedic Surgery

## 2023-09-01 ENCOUNTER — Telehealth (HOSPITAL_COMMUNITY): Payer: Self-pay | Admitting: Pharmacy Technician

## 2023-09-01 DIAGNOSIS — M1612 Unilateral primary osteoarthritis, left hip: Secondary | ICD-10-CM | POA: Diagnosis not present

## 2023-09-01 LAB — BASIC METABOLIC PANEL WITH GFR
Anion gap: 10 (ref 5–15)
BUN: 21 mg/dL (ref 8–23)
CO2: 22 mmol/L (ref 22–32)
Calcium: 8.8 mg/dL — ABNORMAL LOW (ref 8.9–10.3)
Chloride: 101 mmol/L (ref 98–111)
Creatinine, Ser: 1.02 mg/dL (ref 0.61–1.24)
GFR, Estimated: >60 mL/min (ref >60)
Glucose, Bld: 144 mg/dL — ABNORMAL HIGH (ref 70–99)
Potassium: 4.3 mmol/L (ref 3.5–5.1)
Sodium: 133 mmol/L — ABNORMAL LOW (ref 135–145)

## 2023-09-01 LAB — CBC
HCT: 48.5 % (ref 39.0–52.0)
Hemoglobin: 16 g/dL (ref 13.0–17.0)
MCH: 28.6 pg (ref 26.0–34.0)
MCHC: 33 g/dL (ref 30.0–36.0)
MCV: 86.8 fL (ref 80.0–100.0)
Platelets: 176 K/uL (ref 150–400)
RBC: 5.59 MIL/uL (ref 4.22–5.81)
RDW: 13.3 % (ref 11.5–15.5)
WBC: 12.7 K/uL — ABNORMAL HIGH (ref 4.0–10.5)
nRBC: 0 % (ref 0.0–0.2)

## 2023-09-01 LAB — GLUCOSE, CAPILLARY
Glucose-Capillary: 134 mg/dL — ABNORMAL HIGH (ref 70–99)
Glucose-Capillary: 161 mg/dL — ABNORMAL HIGH (ref 70–99)

## 2023-09-01 MED ORDER — METHOCARBAMOL 500 MG PO TABS
500.0000 mg | ORAL_TABLET | Freq: Four times a day (QID) | ORAL | 0 refills | Status: AC | PRN
  Filled 2023-09-01: qty 40, 10d supply, fill #0

## 2023-09-01 MED ORDER — ONDANSETRON HCL 4 MG PO TABS
4.0000 mg | ORAL_TABLET | Freq: Four times a day (QID) | ORAL | 0 refills | Status: AC | PRN
  Filled 2023-09-01: qty 20, 5d supply, fill #0

## 2023-09-01 MED ORDER — RIVAROXABAN 10 MG PO TABS
10.0000 mg | ORAL_TABLET | Freq: Every day | ORAL | 0 refills | Status: AC
  Filled 2023-09-01: qty 21, 21d supply, fill #0

## 2023-09-01 MED ORDER — HYDROCODONE-ACETAMINOPHEN 5-325 MG PO TABS
1.0000 | ORAL_TABLET | Freq: Four times a day (QID) | ORAL | 0 refills | Status: DC | PRN
  Filled 2023-09-01: qty 42, 6d supply, fill #0

## 2023-09-01 NOTE — Progress Notes (Signed)
Discharge package printed and instructions given to pt. Pt verbalizes understanding. 

## 2023-09-01 NOTE — Progress Notes (Signed)
   Subjective: 1 Day Post-Op Procedure(s) (LRB): ARTHROPLASTY, HIP, TOTAL, ANTERIOR APPROACH (Left) Patient reports pain as mild.   Patient seen in rounds by Dr. Melodi. Patient is well, and has had no acute complaints or problems. Denies chest pain or SOB. No issues overnight. Foley catheter removed this AM. We will begin therapy today   Objective: Vital signs in last 24 hours: Temp:  [97.1 F (36.2 C)-98.5 F (36.9 C)] 98.2 F (36.8 C) (07/08 0535) Pulse Rate:  [75-101] 99 (07/08 0535) Resp:  [9-20] 17 (07/08 0535) BP: (131-174)/(80-98) 142/89 (07/08 0535) SpO2:  [96 %-100 %] 98 % (07/08 0535) Weight:  [98.4 kg] 98.4 kg (07/07 1026)  Intake/Output from previous day:  Intake/Output Summary (Last 24 hours) at 09/01/2023 0910 Last data filed at 09/01/2023 0817 Gross per 24 hour  Intake 3542.5 ml  Output 4180 ml  Net -637.5 ml     Intake/Output this shift: Total I/O In: 240 [P.O.:240] Out: -   Labs: Recent Labs    09/01/23 0319  HGB 16.0   Recent Labs    09/01/23 0319  WBC 12.7*  RBC 5.59  HCT 48.5  PLT 176   Recent Labs    09/01/23 0319  NA 133*  K 4.3  CL 101  CO2 22  BUN 21  CREATININE 1.02  GLUCOSE 144*  CALCIUM  8.8*   No results for input(s): LABPT, INR in the last 72 hours.  Exam: General - Patient is Alert and Oriented Extremity - Neurologically intact Neurovascular intact Sensation intact distally Dorsiflexion/Plantar flexion intact Dressing - dressing C/D/I Motor Function - intact, moving foot and toes well on exam.   Past Medical History:  Diagnosis Date   Allergic rhinitis    Allergy     Arthritis    Asthma    Barrett's esophagus    Cataract    removed both eyes with lens replacement Dr Octavia   Complication of anesthesia    took them 6 hours to wake me up   Diabetes mellitus without complication (HCC)    GERD (gastroesophageal reflux disease)    Heart murmur    History of kidney stones    HTN (hypertension)     Hyperlipidemia    Pneumonia    as a kid    Assessment/Plan: 1 Day Post-Op Procedure(s) (LRB): ARTHROPLASTY, HIP, TOTAL, ANTERIOR APPROACH (Left) Principal Problem:   OA (osteoarthritis) of hip Active Problems:   Primary osteoarthritis of left hip  Estimated body mass index is 28.63 kg/m as calculated from the following:   Height as of this encounter: 6' 1 (1.854 m).   Weight as of this encounter: 98.4 kg. Advance diet Up with therapy D/C IV fluids  DVT Prophylaxis - Xarelto  Weight bearing as tolerated. Continue therapy.  Plan is to go Home after hospital stay. Plan for discharge with HEP later today if progresses with therapy and meeting goals. Follow-up in the office in 2 weeks.  The PDMP database was reviewed today prior to any opioid medications being prescribed to this patient.  Roxie Mess, PA-C Orthopedic Surgery [phone] 09/01/2023, 9:10 AM

## 2023-09-01 NOTE — Telephone Encounter (Signed)
 Patient Product/process development scientist completed.    The patient is insured through [employer]. Patient has Medicare and is not eligible for a copay card, but may be able to apply for patient assistance or Medicare RX Payment Plan (Patient Must reach out to their plan, if eligible for payment plan), if available.    Ran test claim for Xarelto  20 mg and the current 30 day co-pay is $35.00.   This test claim was processed through [facility]- copay amounts may vary at other pharmacies due to pharmacy/plan contracts, or as the patient moves through the different stages of their insurance plan.     Reyes Sharps, CPHT Pharmacy Technician III Certified Patient Advocate [facility] Patient Advocate Team Direct Number: [phone]  Fax: [phone]

## 2023-09-01 NOTE — Plan of Care (Signed)
  Problem: Activity: Goal: Risk for activity intolerance will decrease 09/01/2023 0716 by Leontine Ricky BROCKS, RN Outcome: Progressing 08/31/2023 1718 by Leontine Ricky BROCKS, RN Outcome: Progressing   Problem: Pain Managment: Goal: General experience of comfort will improve and/or be controlled 09/01/2023 0716 by Terresa Marlett C, RN Outcome: Progressing 08/31/2023 1718 by Leontine Ricky BROCKS, RN Outcome: Progressing

## 2023-09-01 NOTE — Progress Notes (Signed)
 Discharge medication delivered to patient at bedside [facility]

## 2023-09-01 NOTE — Evaluation (Signed)
 Physical Therapy Evaluation Patient Details Name: Corey [name] MRN: [id number] DOB: [birth year] Today's Date: 09/01/2023  History of Present Illness  Corey [name] is a 73 y.o. male s/p L THA 08/31/23 with a hx of CAD s/p CABG, hypertension, type II diabetes, dyslipidemia, recurrent kidney stones  Clinical Impression  Pt is s/p THA resulting in the deficits listed below (see PT Problem List). Pt in bed, friend at bedside. Pt states that he was able to get to the bathroom this morning with nursing, motivated to improve following surgery. He is able to complete bed mobility at mod I with inc time, Sit to Stand at supervision A with RW. Progresses to amb x236ft and completes 10 stairs to assess tolerance to stair negotiation. Pt able to demonstrate safe mobility with all required tasks for function at home. Pt will benefit from acute skilled PT to increase their independence and safety with mobility to facilitate discharge.          If plan is discharge home, recommend the following: A little help with walking and/or transfers;A little help with bathing/dressing/bathroom;Help with stairs or ramp for entrance;Assistance with cooking/housework;Assist for transportation   Can travel by private vehicle        Equipment Recommendations None recommended by PT  Recommendations for Other Services       Functional Status Assessment Patient has had a recent decline in their functional status and demonstrates the ability to make significant improvements in function in a reasonable and predictable amount of time.     Precautions / Restrictions Precautions Precautions: Fall Recall of Precautions/Restrictions: Intact Restrictions Weight Bearing Restrictions Per Provider Order: Yes LLE Weight Bearing Per Provider Order: Weight bearing as tolerated      Mobility  Bed Mobility Overal bed mobility: Modified Independent             General bed mobility comments: inc time     Transfers Overall transfer level: Needs assistance Equipment used: Rolling walker (2 wheels) Transfers: Sit to/from Stand, Bed to chair/wheelchair/BSC Sit to Stand: Supervision   Step pivot transfers: Supervision       General transfer comment: power up to standing from bed, uses walker to stabilize, no physical assist    Ambulation/Gait Ambulation/Gait assistance: Supervision Gait Distance (Feet): 200 Feet Assistive device: Rolling walker (2 wheels) Gait Pattern/deviations: Step-through pattern, Antalgic, Decreased stance time - left Gait velocity: dec     General Gait Details: Pt amb with step through pattern, reports improving symptoms as he progresses, maintains slow cadence and velocity, dec tolerance to stance phase on LLE  Stairs Stairs: Yes Stairs assistance: Supervision Stair Management: Two rails, One rail Left, Step to pattern Number of Stairs: 10 General stair comments: ascends 10 and descends 10 with good form and safety, recall without cues for correct Associate Professor Wheelchair mobility: Yes   Tilt Bed    Modified Rankin (Stroke Patients Only)       Balance Overall balance assessment: Needs assistance Sitting-balance support: Feet supported, No upper extremity supported Sitting balance-Leahy Scale: Good     Standing balance support: No upper extremity supported Standing balance-Leahy Scale: Fair                               Pertinent Vitals/Pain Pain Assessment Pain Assessment: 0-10 Pain Score: 6  Pain Location: L hip Pain Descriptors / Indicators: Aching, Discomfort, Constant, Sore Pain Intervention(s): Limited activity within patient's tolerance,  Monitored during session, Ice applied    Home Living Family/patient expects to be discharged to:: Private residence Living Arrangements: Alone Available Help at Discharge: Family;Available 24 hours/day (staying with him as long as  needed) Type of Home: House Home Access: Stairs to enter Entrance Stairs-Rails: Can reach both Entrance Stairs-Number of Steps: 3 Alternate Level Stairs-Number of Steps: 14 Home Layout: Two level Home Equipment: Agricultural consultant (2 wheels);Cane - single point      Prior Function Prior Level of Function : Independent/Modified Independent                     Extremity/Trunk Assessment   Upper Extremity Assessment Upper Extremity Assessment: Overall WFL for tasks assessed    Lower Extremity Assessment Lower Extremity Assessment: LLE deficits/detail LLE Deficits / Details: increased pain affects function and cadence with tasks, otherwise well tolerated with pacing    Cervical / Trunk Assessment Cervical / Trunk Assessment: Normal  Communication   Communication Communication: No apparent difficulties    Cognition Arousal: Alert Behavior During Therapy: WFL for tasks assessed/performed   PT - Cognitive impairments: No apparent impairments                         Following commands: Intact       Cueing Cueing Techniques: Verbal cues, Gestural cues     General Comments      Exercises Total Joint Exercises Ankle Circles/Pumps: AROM, 10 reps, Both Quad Sets: AROM, 10 reps, Left Heel Slides: AAROM, 10 reps, Left Hip ABduction/ADduction: AAROM, 10 reps, Left Long Arc Quad: AROM, 5 reps, Left   Assessment/Plan    PT Assessment Patient needs continued PT services  PT Problem List Decreased strength;Decreased balance;Decreased mobility;Decreased range of motion;Decreased activity tolerance       PT Treatment Interventions DME instruction;Functional mobility training;Balance training;Patient/family education;Gait training;Therapeutic activities;Stair training;Therapeutic exercise    PT Goals (Current goals can be found in the Care Plan section)  Acute Rehab PT Goals Patient Stated Goal: decrease pain and move better PT Goal Formulation: With  patient Time For Goal Achievement: 09/15/23 Potential to Achieve Goals: Good    Frequency 7X/week     Co-evaluation               AM-PAC PT 6 Clicks Mobility  Outcome Measure Help needed turning from your back to your side while in a flat bed without using bedrails?: None Help needed moving from lying on your back to sitting on the side of a flat bed without using bedrails?: None Help needed moving to and from a bed to a chair (including a wheelchair)?: A Little Help needed standing up from a chair using your arms (e.g., wheelchair or bedside chair)?: A Little Help needed to walk in hospital room?: A Little Help needed climbing 3-5 steps with a railing? : A Little 6 Click Score: 20    End of Session Equipment Utilized During Treatment: Gait belt Activity Tolerance: Patient tolerated treatment well Patient left: in chair;with call bell/phone within reach;with family/visitor present Nurse Communication: Mobility status PT Visit Diagnosis: Difficulty in walking, not elsewhere classified (R26.2);Unsteadiness on feet (R26.81)    Time: 9067-8998 PT Time Calculation (min) (ACUTE ONLY): 29 min   Charges:   PT Evaluation $PT Eval Low Complexity: 1 Low PT Treatments $Gait Training: 8-22 mins PT General Charges $$ ACUTE PT VISIT: 1 Visit         Stann, PT Acute Rehabilitation Services Office: [phone] 09/01/2023  Stann DELENA Ohara 09/01/2023, 10:41 AM

## 2023-09-01 NOTE — TOC Transition Note (Signed)
 Transition of Care [facility]) - Discharge Note   Patient Details  Name: Corey [name] MRN: [id number] Date of Birth: 1950-03-07  Transition of [facility]) CM/SW Contact:  NORMAN ASPEN, LCSW Phone Number: 09/01/2023, 9:34 AM   Clinical Narrative:     Met with pt who confirms he has needed DME in the home.  Plan for HEP.  No further TOC needs.  Final next level of care: Home/Self Care Barriers to Discharge: No Barriers Identified   Patient Goals and CMS Choice Patient states their goals for this hospitalization and ongoing recovery are:: return home          Discharge Placement                       Discharge Plan and Services Additional resources added to the After Visit Summary for                  DME Arranged: N/A DME Agency: NA                  Social Drivers of Health (SDOH) Interventions SDOH Screenings   Food Insecurity: No Food Insecurity (08/31/2023)  Housing: Low Risk  (08/31/2023)  Transportation Needs: No Transportation Needs (08/31/2023)  Utilities: Not At Risk (08/31/2023)  Depression (PHQ2-9): Low Risk  (08/19/2023)  Financial Resource Strain: Low Risk  (08/17/2023)  Physical Activity: Insufficiently Active (08/17/2023)  Social Connections: Moderately Integrated (08/31/2023)  Stress: No Stress Concern Present (08/17/2023)  Tobacco Use: Low Risk  (08/31/2023)     Readmission Risk Interventions     No data to display

## 2023-09-08 NOTE — Discharge Summary (Signed)
 Patient ID: Corey [name] MRN: [id number] DOB/AGE: 71-Nov-1952 71 y.o.  Admit date: 08/31/2023 Discharge date: 09/01/2023  Admission Diagnoses:  Principal Problem:   OA (osteoarthritis) of hip Active Problems:   Primary osteoarthritis of left hip   Discharge Diagnoses:  Same  Past Medical History:  Diagnosis Date   Allergic rhinitis    Allergy     Arthritis    Asthma    Barrett's esophagus    Cataract    removed both eyes with lens replacement Dr Octavia   Complication of anesthesia    took them 6 hours to wake me up   Diabetes mellitus without complication (HCC)    GERD (gastroesophageal reflux disease)    Heart murmur    History of kidney stones    HTN (hypertension)    Hyperlipidemia    Pneumonia    as a kid    Surgeries: Procedure(s): ARTHROPLASTY, HIP, TOTAL, ANTERIOR APPROACH on 08/31/2023   Consultants:   Discharged Condition: Improved  Hospital Course: Corey [name] is an 73 y.o. male who was admitted 08/31/2023 for operative treatment ofOA (osteoarthritis) of hip. Patient has severe unremitting pain that affects sleep, daily activities, and work/hobbies. After pre-op clearance the patient was taken to the operating room on 08/31/2023 and underwent  Procedure(s): ARTHROPLASTY, HIP, TOTAL, ANTERIOR APPROACH.    Patient was given perioperative antibiotics:  Anti-infectives (From admission, onward)    Start     Dose/Rate Route Frequency Ordered Stop   08/31/23 1830  ceFAZolin  (ANCEF ) IVPB 2g/100 mL premix        2 g 200 mL/hr over 30 Minutes Intravenous Every 6 hours 08/31/23 1629 09/01/23 0037   08/31/23 1000  ceFAZolin  (ANCEF ) IVPB 2g/100 mL premix        2 g 200 mL/hr over 30 Minutes Intravenous On call to O.R. 08/31/23 0959 08/31/23 1256        Patient was given sequential compression devices, early ambulation, and chemoprophylaxis to prevent DVT.  Patient benefited maximally from hospital stay and there were no complications.    Recent vital  signs: No data found.   Recent laboratory studies: No results for input(s): WBC, HGB, HCT, PLT, NA, K, CL, CO2, BUN, CREATININE, GLUCOSE, INR, CALCIUM  in the last 72 hours.  Invalid input(s): PT, 2   Discharge Medications:   Allergies as of 09/01/2023       Reactions   Nexletol  [bempedoic Acid ] Other (See Comments)   Clots in urine per pt   Other Hives   Corn=hives   Peanut-containing Drug Products Anaphylaxis   Shellfish-derived Products Anaphylaxis   Tomato Hives   Chocolate Hives   Albuterol    REACTION: feels like drowning   Aspirin  Other (See Comments)   Causes bruising (high doses)   Ciprofloxacin    REACTION: rash   Clarithromycin    REACTION: swelling   Colesevelam    REACTION: constip   Enalapril Maleate    REACTION: Erectile dysfunction   Esomeprazole Magnesium     REACTION: epigastric pain   Niacin    REACTION: diarrhea   Pravastatin Sodium    REACTION: aching, listless   Rosuvastatin    REACTION: achy   Sulfamethoxazole Dermatitis   Metformin  And Related Palpitations   CP   Penicillins Hives   Has patient had a PCN reaction causing immediate rash, facial/tongue/throat swelling, SOB or lightheadedness with hypotension: Yes Has patient had a PCN reaction causing severe rash involving mucus membranes or skin necrosis: Yes Has patient had a PCN reaction  that required hospitalization No Has patient had a PCN reaction occurring within the last 10 years: No If all of the above answers are NO, then may proceed with Cephalosporin use. Tolerated Cephalosporin Date: 09/01/23.          Medication List     STOP taking these medications    aspirin  EC 81 MG tablet   FT Vitamin K2 100 MCG Caps Generic drug: Menaquinone-7   L-CITRULLINE PO   LECITHIN PO   MAG GLYCINATE PO   Melatonin 10 MG Tabs   thiamine 100 MG tablet Commonly known as: Vitamin B-1   vitamin C with rose hips 1000 MG tablet   VITAMIN D  PO   zinc  sulfate (50mg  elemental zinc) 220 (50 Zn) MG capsule       TAKE these medications    cetirizine  10 MG tablet Commonly known as: ZYRTEC  Take 10 mg by mouth 2 (two) times daily. Morning & before supper   ezetimibe  10 MG tablet Commonly known as: ZETIA  Take 1 tablet (10 mg total) by mouth daily.   fluticasone 0.05 % cream Commonly known as: CUTIVATE Apply 1 Application topically daily as needed (hives/itching.).   FreeStyle Libre 3 Plus Sensor Misc 1 Device by Other route every 14 (fourteen) days. Change sensor every 15 days.   Gvoke HypoPen  1-Pack 1 MG/0.2ML Soaj Generic drug: Glucagon  INJECT 0.2 MLS INTO THE SKIN DAILY AS NEEDED.   HYDROcodone -acetaminophen  5-325 MG tablet Commonly known as: NORCO/VICODIN Take 1-2 tablets by mouth every 6 (six) hours as needed for severe pain (pain score 7-10).   insulin  lispro 100 UNIT/ML KwikPen Commonly known as: HumaLOG  KwikPen Max daily 30 units What changed:  how much to take how to take this when to take this   Insulin  Pen Needle 32G X 4 MM Misc 1 Device by Does not apply route in the morning, at noon, in the evening, and at bedtime.   ketoconazole 2 % shampoo Commonly known as: NIZORAL Apply topically every other day.   methocarbamol  500 MG tablet Commonly known as: ROBAXIN  Take 1 tablet (500 mg total) by mouth every 6 (six) hours as needed for muscle spasms.   omeprazole 20 MG tablet Commonly known as: PRILOSEC OTC Take 10 mg by mouth every evening.   ondansetron  4 MG tablet Commonly known as: ZOFRAN  Take 1 tablet (4 mg total) by mouth every 6 (six) hours as needed for nausea.   OneTouch Verio test strip Generic drug: glucose blood 1 each by Other route in the morning, at noon, in the evening, and at bedtime. Use as instructed   OneTouch Verio w/Device Kit 1 Device by Does not apply route daily in the afternoon.   promethazine  25 MG tablet Commonly known as: PHENERGAN  Take 1-2 tablets (25-50 mg total) by mouth  every 4 (four) hours as needed for up to 7 days for nausea or vomiting.   tadalafil  5 MG tablet Commonly known as: CIALIS  Take 1 tablet (5 mg total) by mouth daily as needed for erectile dysfunction. Overdue for Annual appt must see provider for future refills What changed: when to take this   Tresiba  FlexTouch 100 UNIT/ML FlexTouch Pen Generic drug: insulin  degludec Inject 16 Units into the skin daily.   triamcinolone cream 0.1 % Commonly known as: KENALOG Apply 1 Application topically 2 (two) times daily as needed (skin irritation/rash.).   valsartan  160 MG tablet Commonly known as: DIOVAN  Take 1 tablet (160 mg total) by mouth daily.   Xarelto  10 MG  Tabs tablet Generic drug: rivaroxaban  Take 1 tablet (10 mg total) by mouth daily with breakfast for 21 days. Then resume one 81 mg aspirin  once a day               Discharge Care Instructions  (From admission, onward)           Start     Ordered   09/01/23 0000  Weight bearing as tolerated        09/01/23 0914   09/01/23 0000  Change dressing       Comments: You have an adhesive waterproof bandage over the incision. Leave this in place until your first follow-up appointment. Once you remove this you will not need to place another bandage.   09/01/23 0914            Diagnostic Studies: DG HIP UNILAT WITH PELVIS 1V LEFT Result Date: 08/31/2023 CLINICAL DATA:  Elective surgery. EXAM: DG HIP (WITH OR WITHOUT PELVIS) 1V*L* COMPARISON:  None Available. FINDINGS: Eight fluoroscopic spot views of the pelvis and left hip obtained in the operating room. Sequential images during hip arthroplasty. Fluoroscopy time 4 seconds. Dose 0.9 mGy. Previous right hip arthroplasty. IMPRESSION: Intraoperative fluoroscopy during left hip arthroplasty. Electronically Signed   By: Andrea Gasman M.D.   On: 08/31/2023 14:41   DG C-Arm 1-60 Min-No Report Result Date: 08/31/2023 Fluoroscopy was utilized by the requesting physician.  No  radiographic interpretation.   DG C-Arm 1-60 Min-No Report Result Date: 08/31/2023 Fluoroscopy was utilized by the requesting physician.  No radiographic interpretation.    Disposition: Discharge disposition: 01-Home or Self Care       Discharge Instructions     Call MD / Call 911   Complete by: As directed    If you experience chest pain or shortness of breath, CALL 911 and be transported to the hospital emergency room.  If you develope a fever above 101 F, pus (white drainage) or increased drainage or redness at the wound, or calf pain, call your surgeon's office.   Change dressing   Complete by: As directed    You have an adhesive waterproof bandage over the incision. Leave this in place until your first follow-up appointment. Once you remove this you will not need to place another bandage.   Constipation Prevention   Complete by: As directed    Drink plenty of fluids.  Prune juice may be helpful.  You may use a stool softener, such as Colace (over the counter) 100 mg twice a day.  Use MiraLax  (over the counter) for constipation as needed.   Diet - low sodium heart healthy   Complete by: As directed    Do not sit on low chairs, stoools or toilet seats, as it may be difficult to get up from low surfaces   Complete by: As directed    Driving restrictions   Complete by: As directed    No driving for two weeks   Post-operative opioid taper instructions:   Complete by: As directed    POST-OPERATIVE OPIOID TAPER INSTRUCTIONS: It is important to wean off of your opioid medication as soon as possible. If you do not need pain medication after your surgery it is ok to stop day one. Opioids include: Codeine, Hydrocodone (Norco, Vicodin), Oxycodone (Percocet, oxycontin ) and hydromorphone  amongst others.  Long term and even short term use of opiods can cause: Increased pain response Dependence Constipation Depression Respiratory depression And more.  Withdrawal symptoms can  include Flu like symptoms Nausea, vomiting  And more Techniques to manage these symptoms Hydrate well Eat regular healthy meals Stay active Use relaxation techniques(deep breathing, meditating, yoga) Do Not substitute Alcohol to help with tapering If you have been on opioids for less than two weeks and do not have pain than it is ok to stop all together.  Plan to wean off of opioids This plan should start within one week post op of your joint replacement. Maintain the same interval or time between taking each dose and first decrease the dose.  Cut the total daily intake of opioids by one tablet each day Next start to increase the time between doses. The last dose that should be eliminated is the evening dose.      TED hose   Complete by: As directed    Use stockings (TED hose) for three weeks on both leg(s).  You may remove them at night for sleeping.   Weight bearing as tolerated   Complete by: As directed         Follow-up Information     Aluisio, Dempsey, MD Follow up in 2 week(s).   Specialty: Orthopedic Surgery Contact information: [address] [address] [place] [address] [phone]                  Signed: Roxie Mess 09/08/2023, 12:34 PM

## 2024-01-12 ENCOUNTER — Ambulatory Visit (INDEPENDENT_AMBULATORY_CARE_PROVIDER_SITE_OTHER): Admitting: Internal Medicine

## 2024-01-12 ENCOUNTER — Encounter: Payer: Self-pay | Admitting: Internal Medicine

## 2024-01-12 VITALS — BP 110/80 | Ht 73.0 in | Wt 223.0 lb

## 2024-01-12 DIAGNOSIS — Z794 Long term (current) use of insulin: Secondary | ICD-10-CM

## 2024-01-12 DIAGNOSIS — E1165 Type 2 diabetes mellitus with hyperglycemia: Secondary | ICD-10-CM

## 2024-01-12 LAB — POCT GLYCOSYLATED HEMOGLOBIN (HGB A1C): Hemoglobin A1C: 7.2 % — AB (ref 4.0–5.6)

## 2024-01-12 MED ORDER — INSULIN LISPRO (1 UNIT DIAL) 100 UNIT/ML (KWIKPEN): PEN_INJECTOR | SUBCUTANEOUS | 3 refills | Status: AC

## 2024-01-12 MED ORDER — VALSARTAN 160 MG PO TABS: 160.0000 mg | ORAL_TABLET | Freq: Every day | ORAL | 3 refills | Status: AC

## 2024-01-12 MED ORDER — FREESTYLE LIBRE 3 PLUS SENSOR MISC: 1.0000 | 3 refills | Status: DC

## 2024-01-12 MED ORDER — TRESIBA FLEXTOUCH 100 UNIT/ML ~~LOC~~ SOPN: 16.0000 [IU] | PEN_INJECTOR | Freq: Every day | SUBCUTANEOUS | 4 refills | Status: AC

## 2024-01-12 MED ORDER — INSULIN PEN NEEDLE 32G X 4 MM MISC: 1.0000 | Freq: Four times a day (QID) | 3 refills | Status: AC

## 2024-01-12 NOTE — Progress Notes (Unsigned)
 Name: Corey [name]  Age/ Sex: 73 y.o., male   MRN/ DOB: [id number], [birth year]     PCP: Garald Karlynn GAILS, MD   Reason for Endocrinology Evaluation: Type 2 Diabetes Mellitus  Initial Endocrine Consultative Visit: 07/13/2018    PATIENT IDENTIFIER: Corey [name] is a 73 y.o. male with a past medical history of HTN , Chronic Urticaria , T2DM, CAD and Hx of nephrolithiasis . The patient has followed with Endocrinology clinic since 07/13/2018 for consultative assistance with management of his diabetes.  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 He was started on an MDI regimen. Pt is not a big fan of western medicine.    Pt has a diagnosis of chronic urticaria for years, his last attack of this was in January, 2020 and having to go on prednisone  80 mg daily with injections as well, that he attributes his newly diagnosed DM to   Prandial insulin  stopped 08/2018 Basal insulin  stopped 12/2018   Intolerant to Metformin  due to chest pains - 2023  Restarted Humalog  per correction scale with an A1c of 7.9% 03/2022  Started basal insulin  04/2023 with an A1c of 9.0%  SUBJECTIVE:   During the last visit (08/11/2023): A1c 7.2  %.     Today (01/12/2024): Corey [name] is here for a  follow up on his diabetes management.    He checks his blood sugars 3x daily. The patient has not had hypoglycemic episodes since the last clinic visit.    Pt is S/P left hip arthroplasty 08/31/2023, per patient his hip hip has recovered well, but unfortunately was complicated by urethral injury and he has a pending urethral lift He also has received antibiotics for UTI He does receive prednisone  for urticaria on occasions and does increase his basal/prandial No nausea or vomiting No constipation or diarrhea    HOME DIABETES REGIMEN:  Tresiba  16 units  daily Humalog  4 units with each meal Humalog  (BG -120/25) TIDQAC Valsartan  160 Mg daily    CONTINUOUS GLUCOSE MONITORING RECORD INTERPRETATION    Dates of Recording: 11/4-11/17/2025  Sensor description: Freestyle libre  Results statistics:   CGM use % of time 96  Average and SD 170/27.3  Time in range 68  %  % Time Above 180 25  % Time above 250 7  % Time Below target 0   Glycemic patterns summary: BGs are optimal overnight and fluctuate during the day Hyperglycemic episodes postprandial  Hypoglycemic episodes occurred N/A  Overnight periods: Optimal     DIABETIC COMPLICATIONS: Microvascular complications:    Denies: retinopathy, neuropathy, CKD Last eye exam: Scheduled this week   Macrovascular complications:   CAD (S/P CABG)  Denies:  PVD, CVA      HISTORY:  Past Medical History:  Past Medical History:  Diagnosis Date   Allergic rhinitis    Allergy     Arthritis    Asthma    Barrett's esophagus    Cataract    removed both eyes with lens replacement Dr Octavia   Complication of anesthesia    took them 6 hours to wake me up   Diabetes mellitus without complication (HCC)    GERD (gastroesophageal reflux disease)    Heart murmur    History of kidney stones    HTN (hypertension)    Hyperlipidemia    Pneumonia    as a kid  Past Surgical History:  Past Surgical History:  Procedure Laterality Date   CATARACT EXTRACTION, BILATERAL     with lens implants    COLONOSCOPY  01/15/2009   normal    CORONARY ARTERY BYPASS GRAFT N/A 07/07/2019   Procedure: CORONARY ARTERY BYPASS GRAFTING (CABG) using LIMA to LAD; Free IMA to RCA.;  Surgeon: Dusty Sudie DEL, MD;  Location: [facility] OR;  Service: Open Heart Surgery;  Laterality: N/A;  BILATERAL IMA   ENDOVEIN HARVEST OF GREATER SAPHENOUS VEIN Right 07/07/2019   Procedure: Jethro Rubins Of Greater Saphenous Vein;  Surgeon: Dusty Sudie DEL, MD;  Location: [facility] OR;  Service: Open Heart Surgery;  Laterality: Right;    ESOPHAGOGASTRODUODENOSCOPY  03/20/2006   EYE SURGERY     KIDNEY STONE SURGERY     x 4 - most recent 04-2018 at [facility]    LEFT HEART CATH AND CORONARY ANGIOGRAPHY N/A 07/05/2019   Procedure: LEFT HEART CATH AND CORONARY ANGIOGRAPHY;  Surgeon: Koelzer, Peter M, MD;  Location: [facility] INVASIVE CV LAB;  Service: Cardiovascular;  Laterality: N/A;   TEE WITHOUT CARDIOVERSION N/A 07/07/2019   Procedure: TRANSESOPHAGEAL ECHOCARDIOGRAM (TEE);  Surgeon: Dusty Sudie DEL, MD;  Location: [facility] OR;  Service: Open Heart Surgery;  Laterality: N/A;   TONSILLECTOMY     TOTAL HIP ARTHROPLASTY Right 04/30/2016   Procedure: RIGHT TOTAL HIP ARTHROPLASTY ANTERIOR APPROACH;  Surgeon: Dempsey Moan, MD;  Location: [facility] ORS;  Service: Orthopedics;  Laterality: Right;  requests   TOTAL HIP ARTHROPLASTY Left 08/31/2023   Procedure: ARTHROPLASTY, HIP, TOTAL, ANTERIOR APPROACH;  Surgeon: Moan Dempsey, MD;  Location: [facility] ORS;  Service: Orthopedics;  Laterality: Left;   TRANSTHORACIC ECHOCARDIOGRAM  01/05/1998   UPPER GASTROINTESTINAL ENDOSCOPY     Social History:  reports that he has never smoked. He has never used smokeless tobacco. He reports that he does not drink alcohol and does not use drugs. Family History:  Family History  Problem Relation Age of Onset   Colon polyps Mother    Heart disease Mother    Diabetes Mother    Heart disease Father    Hypertension Other    Colon cancer Neg Hx    Esophageal cancer Neg Hx    Rectal cancer Neg Hx    Stomach cancer Neg Hx      HOME MEDICATIONS: Allergies as of 01/12/2024       Reactions   Nexletol  [bempedoic Acid ] Other (See Comments)   Clots in urine per pt   Other Hives   Corn=hives   Peanut-containing Drug Products Anaphylaxis   Shellfish Protein-containing Drug Products Anaphylaxis   Tomato Hives   Chocolate Hives   Albuterol    REACTION: feels like drowning   Aspirin  Other (See Comments)   Causes bruising (high doses)   Ciprofloxacin     REACTION: rash   Clarithromycin    REACTION: swelling   Colesevelam    REACTION: constip   Enalapril Maleate    REACTION: Erectile dysfunction   Esomeprazole Magnesium     REACTION: epigastric pain   Niacin    REACTION: diarrhea   Pravastatin Sodium    REACTION: aching, listless   Rosuvastatin    REACTION: achy   Sulfamethoxazole Dermatitis   Metformin  And Related Palpitations   CP   Penicillins Hives   Has patient had a PCN reaction causing immediate rash, facial/tongue/throat swelling, SOB or lightheadedness with hypotension: Yes Has patient had a PCN reaction causing severe rash involving mucus membranes or skin necrosis: Yes Has  patient had a PCN reaction that required hospitalization No Has patient had a PCN reaction occurring within the last 10 years: No If all of the above answers are NO, then may proceed with Cephalosporin use. Tolerated Cephalosporin Date: 09/01/23.          Medication List        Accurate as of January 12, 2024 10:03 AM. If you have any questions, ask your nurse or doctor.          STOP taking these medications    HYDROcodone -acetaminophen  5-325 MG tablet Commonly known as: NORCO/VICODIN Stopped by: Tiffani Kadow J Swayzie Choate       TAKE these medications    cetirizine  10 MG tablet Commonly known as: ZYRTEC  Take 10 mg by mouth 2 (two) times daily. Morning & before supper   ezetimibe  10 MG tablet Commonly known as: ZETIA  Take 1 tablet (10 mg total) by mouth daily.   fluticasone 0.05 % cream Commonly known as: CUTIVATE Apply 1 Application topically daily as needed (hives/itching.).   FreeStyle Libre 3 Plus Sensor Misc 1 Device by Other route every 14 (fourteen) days. Change sensor every 15 days.   Gvoke HypoPen  1-Pack 1 MG/0.2ML Soaj Generic drug: Glucagon  INJECT 0.2 MLS INTO THE SKIN DAILY AS NEEDED.   insulin  lispro 100 UNIT/ML KwikPen Commonly known as: HumaLOG  KwikPen Max daily 30 units What changed:  how much to  take how to take this when to take this   Insulin  Pen Needle 32G X 4 MM Misc 1 Device by Does not apply route in the morning, at noon, in the evening, and at bedtime.   ketoconazole 2 % shampoo Commonly known as: NIZORAL Apply topically every other day.   methocarbamol  500 MG tablet Commonly known as: ROBAXIN  Take 1 tablet (500 mg total) by mouth every 6 (six) hours as needed for muscle spasms.   omeprazole 20 MG tablet Commonly known as: PRILOSEC OTC Take 10 mg by mouth every evening.   ondansetron  4 MG tablet Commonly known as: ZOFRAN  Take 1 tablet (4 mg total) by mouth every 6 (six) hours as needed for nausea.   OneTouch Verio test strip Generic drug: glucose blood 1 each by Other route in the morning, at noon, in the evening, and at bedtime. Use as instructed   OneTouch Verio w/Device Kit 1 Device by Does not apply route daily in the afternoon.   promethazine  25 MG tablet Commonly known as: PHENERGAN  Take 1-2 tablets (25-50 mg total) by mouth every 4 (four) hours as needed for up to 7 days for nausea or vomiting.   tadalafil  5 MG tablet Commonly known as: CIALIS  Take 1 tablet (5 mg total) by mouth daily as needed for erectile dysfunction. Overdue for Annual appt must see provider for future refills What changed: when to take this   Tresiba  FlexTouch 100 UNIT/ML FlexTouch Pen Generic drug: insulin  degludec Inject 16 Units into the skin daily.   triamcinolone cream 0.1 % Commonly known as: KENALOG Apply 1 Application topically 2 (two) times daily as needed (skin irritation/rash.).   valsartan  160 MG tablet Commonly known as: DIOVAN  Take 1 tablet (160 mg total) by mouth daily.        PHYSICAL EXAM: VS: BP 110/80   Ht 6' 1 (1.854 m)   Wt 223 lb (101.2 kg)   BMI 29.42 kg/m   EXAM: General: Pt appears well and is in NAD  Lungs: Clear with good BS bilat   Heart: Auscultation: RRR   Extremities:  BL LE: no pretibial edema   Mental Status: Judgment,  insight: intact Orientation: oriented to time, place, and person Mood and affect: no depression, anxiety, or agitation   DM Foot Exam 08/11/2023 The skin of the feet is intact without sores or ulcerations.  The pedal pulses are 2+ on right and 2+ on left. The sensation is intact to a screening 5.07, 10 gram monofilament bilaterally   DATA REVIEWED:   Latest Reference Range & Units 05/11/23 14:10  Sodium 135 - 146 mmol/L 135  Potassium 3.5 - 5.3 mmol/L 4.7  Chloride 98 - 110 mmol/L 99  CO2 20 - 32 mmol/L 28  Glucose 65 - 99 mg/dL 824 (H)  BUN 7 - 25 mg/dL 19  Creatinine 9.29 - 8.71 mg/dL 8.76  Calcium  8.6 - 10.3 mg/dL 9.5  BUN/Creatinine Ratio 6 - 22 (calc) SEE NOTE:  eGFR > OR = 60 mL/min/1.26m2 63  Total CHOL/HDL Ratio <5.0 (calc) 4.2  Cholesterol <200 mg/dL 802  HDL Cholesterol > OR = 40 mg/dL 47  LDL Cholesterol (Calc) mg/dL (calc) 879 (H)  MICROALB/CREAT RATIO <30 mg/g creat 95 (H)  Non-HDL Cholesterol (Calc) <130 mg/dL (calc) 849 (H)  Triglycerides <150 mg/dL 820 (H)    Latest Reference Range & Units 05/11/23 14:10  Microalb, Ur mg/dL 89.3  MICROALB/CREAT RATIO <30 mg/g creat 95 (H)  Creatinine, Urine 20 - 320 mg/dL 887      ASSESSMENT / PLAN / RECOMMENDATIONS:  1) Type 2 Diabetes Mellitus, optimally Controlled , With microalbuminuria and macrovascular  complications - Most recent A1c of 7.2%. Goal A1c < 7.0 %.     -A1c remains stable - Intolerant to Metformin   -I have attempted to prescribe Jardiance  in 2024 but it was cost prohibitive as well as he was skeptical about side effects -GLP-1 agonist cost prohibitive - I will increase prandial insulin  as below, no other changes  MEDICATIONS: Continue Tresiba  16 units daily  Increase Humalog  6 units with each meal Continue  Humalog  ( BG-130/25) TIDQAC   EDUCATION / INSTRUCTIONS: BG monitoring instructions: Patient is instructed to check his blood sugars 3 times a week.  2) Dyslipidemia :   -Unable to  tolerate statin therapy due to myalgias -He is on Zetia    3) Microalbuminuria:  -Patient on valsartan  -Unable to provide a urine sample today -No changes   Medication  Continue Diovan  160 mg daily   F/U in 6 months    Signed electronically by: Stefano Redgie Butts, MD  [facility] Endocrinology  [facility] [address] [place] [place], [place] [address] Phone: [phone] FAX: [phone]   CC: Garald Karlynn GAILS, MD [address] [place] [address] Phone: [phone]  Fax: [phone]  Return to Endocrinology clinic as below: No future appointments.

## 2024-01-12 NOTE — Patient Instructions (Addendum)
 A1c 7.2% Continue Tresiba  16 units daily  Increase Humalog  6 units with each meal Humalog  correctional insulin : Use the scale below to help guide you before each meal in addition to Humalog  4 units.  Blood sugar before meal Number of units to inject  Less than 155 0 unit  156- 180 1 units  181 - 205 2 units  206 - 230 3 units  231 - 255 4 units  256 - 280 5 units  281 - 305 6 units     HOW TO TREAT LOW BLOOD SUGARS (Blood sugar LESS THAN 70 MG/DL) Please follow the RULE OF 15 for the treatment of hypoglycemia treatment (when your (blood sugars are less than 70 mg/dL)   STEP 1: Take 15 grams of carbohydrates when your blood sugar is low, which includes:  3-4 GLUCOSE TABS  OR 3-4 OZ OF JUICE OR REGULAR SODA OR ONE TUBE OF GLUCOSE GEL    STEP 2: RECHECK blood sugar in 15 MINUTES STEP 3: If your blood sugar is still low at the 15 minute recheck --> then, go back to STEP 1 and treat AGAIN with another 15 grams of carbohydrates.

## 2024-03-27 ENCOUNTER — Other Ambulatory Visit: Payer: Self-pay | Admitting: Internal Medicine

## 2024-07-12 ENCOUNTER — Ambulatory Visit: Admitting: Internal Medicine
